# Patient Record
Sex: Male | Born: 1945 | Race: White | Hispanic: No | Marital: Married | Smoking: Former smoker
Health system: Southern US, Community
[De-identification: ages and names within clinical notes are randomized; demographics above are authoritative.]

## PROBLEM LIST (undated history)

## (undated) DIAGNOSIS — J189 Pneumonia, unspecified organism: Secondary | ICD-10-CM

## (undated) DIAGNOSIS — E785 Hyperlipidemia, unspecified: Secondary | ICD-10-CM

## (undated) DIAGNOSIS — I6529 Occlusion and stenosis of unspecified carotid artery: Secondary | ICD-10-CM

## (undated) DIAGNOSIS — R001 Bradycardia, unspecified: Secondary | ICD-10-CM

## (undated) DIAGNOSIS — M1990 Unspecified osteoarthritis, unspecified site: Secondary | ICD-10-CM

## (undated) DIAGNOSIS — I2510 Atherosclerotic heart disease of native coronary artery without angina pectoris: Secondary | ICD-10-CM

## (undated) DIAGNOSIS — F418 Other specified anxiety disorders: Secondary | ICD-10-CM

## (undated) DIAGNOSIS — Z0389 Encounter for observation for other suspected diseases and conditions ruled out: Secondary | ICD-10-CM

## (undated) DIAGNOSIS — C801 Malignant (primary) neoplasm, unspecified: Secondary | ICD-10-CM

## (undated) HISTORY — DX: Occlusion and stenosis of unspecified carotid artery: I65.29

## (undated) HISTORY — PX: SKIN CANCER EXCISION: SHX779

## (undated) HISTORY — DX: Malignant (primary) neoplasm, unspecified: C80.1

## (undated) HISTORY — DX: Hyperlipidemia, unspecified: E78.5

## (undated) HISTORY — PX: CORONARY ANGIOPLASTY WITH STENT PLACEMENT: SHX49

## (undated) HISTORY — PX: UMBILICAL HERNIA REPAIR: SHX196

## (undated) HISTORY — DX: Pneumonia, unspecified organism: J18.9

## (undated) HISTORY — PX: ORTHOPEDIC SURGERY: SHX850

## (undated) HISTORY — DX: Unspecified osteoarthritis, unspecified site: M19.90

## (undated) HISTORY — DX: Bradycardia, unspecified: R00.1

## (undated) HISTORY — DX: Atherosclerotic heart disease of native coronary artery without angina pectoris: I25.10

## (undated) HISTORY — PX: CATARACT EXTRACTION, BILATERAL: SHX1313

## (undated) HISTORY — PX: HERNIA REPAIR: SHX51

## (undated) HISTORY — DX: Encounter for observation for other suspected diseases and conditions ruled out: Z03.89

## (undated) HISTORY — PX: COLONOSCOPY: SHX174

## (undated) HISTORY — DX: Other specified anxiety disorders: F41.8

## (undated) HISTORY — PX: HIP ARTHROSCOPY: SUR88

---

## 2001-11-18 ENCOUNTER — Encounter: Payer: Self-pay | Admitting: Orthopedic Surgery

## 2001-11-22 ENCOUNTER — Encounter: Payer: Self-pay | Admitting: Orthopedic Surgery

## 2001-11-22 ENCOUNTER — Inpatient Hospital Stay (HOSPITAL_COMMUNITY): Admission: RE | Admit: 2001-11-22 | Discharge: 2001-11-26 | Payer: Self-pay | Admitting: Orthopedic Surgery

## 2003-12-28 ENCOUNTER — Inpatient Hospital Stay (HOSPITAL_COMMUNITY): Admission: EM | Admit: 2003-12-28 | Discharge: 2003-12-29 | Payer: Self-pay | Admitting: Emergency Medicine

## 2003-12-28 ENCOUNTER — Ambulatory Visit: Payer: Self-pay | Admitting: Cardiology

## 2004-01-11 ENCOUNTER — Ambulatory Visit: Payer: Self-pay | Admitting: *Deleted

## 2004-03-20 ENCOUNTER — Emergency Department (HOSPITAL_COMMUNITY): Admission: EM | Admit: 2004-03-20 | Discharge: 2004-03-21 | Payer: Self-pay | Admitting: Emergency Medicine

## 2004-03-22 ENCOUNTER — Ambulatory Visit: Payer: Self-pay

## 2004-04-22 ENCOUNTER — Ambulatory Visit: Payer: Self-pay

## 2004-05-19 ENCOUNTER — Ambulatory Visit: Payer: Self-pay | Admitting: Cardiology

## 2004-07-15 ENCOUNTER — Ambulatory Visit: Payer: Self-pay | Admitting: Cardiology

## 2004-12-30 ENCOUNTER — Ambulatory Visit: Payer: Self-pay | Admitting: Cardiology

## 2005-07-14 ENCOUNTER — Ambulatory Visit: Payer: Self-pay | Admitting: Cardiology

## 2005-07-21 ENCOUNTER — Inpatient Hospital Stay (HOSPITAL_BASED_OUTPATIENT_CLINIC_OR_DEPARTMENT_OTHER): Admission: RE | Admit: 2005-07-21 | Discharge: 2005-07-21 | Payer: Self-pay | Admitting: Cardiology

## 2005-07-21 ENCOUNTER — Ambulatory Visit: Payer: Self-pay | Admitting: Cardiology

## 2005-08-01 ENCOUNTER — Ambulatory Visit: Payer: Self-pay | Admitting: Cardiology

## 2005-11-03 ENCOUNTER — Ambulatory Visit: Payer: Self-pay | Admitting: Cardiology

## 2006-02-16 ENCOUNTER — Encounter: Admission: RE | Admit: 2006-02-16 | Discharge: 2006-02-16 | Payer: Self-pay | Admitting: Family Medicine

## 2006-06-01 ENCOUNTER — Ambulatory Visit: Payer: Self-pay | Admitting: Cardiology

## 2006-06-01 LAB — CONVERTED CEMR LAB
ALT: 26 units/L (ref 0–40)
AST: 32 units/L (ref 0–37)
Alkaline Phosphatase: 51 units/L (ref 39–117)
BUN: 17 mg/dL (ref 6–23)
CO2: 30 meq/L (ref 19–32)
Calcium: 9.3 mg/dL (ref 8.4–10.5)
Chloride: 107 meq/L (ref 96–112)
Cholesterol: 133 mg/dL (ref 0–200)
Creatinine, Ser: 1 mg/dL (ref 0.4–1.5)
Glucose, Bld: 97 mg/dL (ref 70–99)
Total Bilirubin: 1.1 mg/dL (ref 0.3–1.2)
Total Protein: 6.6 g/dL (ref 6.0–8.3)

## 2006-10-15 ENCOUNTER — Ambulatory Visit: Payer: Self-pay | Admitting: Cardiology

## 2006-10-15 ENCOUNTER — Inpatient Hospital Stay (HOSPITAL_COMMUNITY): Admission: EM | Admit: 2006-10-15 | Discharge: 2006-10-17 | Payer: Self-pay | Admitting: Emergency Medicine

## 2006-11-12 ENCOUNTER — Ambulatory Visit: Payer: Self-pay | Admitting: Cardiology

## 2006-11-16 ENCOUNTER — Ambulatory Visit: Payer: Self-pay

## 2007-04-19 ENCOUNTER — Ambulatory Visit: Payer: Self-pay | Admitting: Cardiology

## 2007-12-13 ENCOUNTER — Ambulatory Visit: Payer: Self-pay

## 2008-04-17 ENCOUNTER — Ambulatory Visit: Payer: Self-pay | Admitting: Cardiology

## 2008-10-15 ENCOUNTER — Telehealth: Payer: Self-pay | Admitting: Cardiology

## 2009-04-21 DIAGNOSIS — F329 Major depressive disorder, single episode, unspecified: Secondary | ICD-10-CM

## 2009-04-21 DIAGNOSIS — M1990 Unspecified osteoarthritis, unspecified site: Secondary | ICD-10-CM

## 2009-04-21 DIAGNOSIS — I6529 Occlusion and stenosis of unspecified carotid artery: Secondary | ICD-10-CM

## 2009-04-21 DIAGNOSIS — E785 Hyperlipidemia, unspecified: Secondary | ICD-10-CM | POA: Insufficient documentation

## 2009-04-21 DIAGNOSIS — I2510 Atherosclerotic heart disease of native coronary artery without angina pectoris: Secondary | ICD-10-CM

## 2009-04-23 ENCOUNTER — Ambulatory Visit: Payer: Self-pay | Admitting: Cardiology

## 2009-04-23 DIAGNOSIS — Z6831 Body mass index (BMI) 31.0-31.9, adult: Secondary | ICD-10-CM | POA: Insufficient documentation

## 2010-03-15 NOTE — Assessment & Plan Note (Signed)
Summary: 1 yr rov 414.01  pfh  Medications Added VITAMIN D3 1000 UNIT CAPS (CHOLECALCIFEROL) 2 podaily LIPITOR 20 MG TABS (ATORVASTATIN CALCIUM) 1 by mouth daiy ASPIRIN 81 MG  TABS (ASPIRIN) 1 by mouth daily MULTIVITAMINS   TABS (MULTIPLE VITAMIN) 1 by mouth daily LEXAPRO 10 MG TABS (ESCITALOPRAM OXALATE) 1 by mouth dialy LORAZEPAM 0.5 MG TABS (LORAZEPAM) as needed NITROSTAT 0.4 MG SUBL (NITROGLYCERIN) 1 by mouth daily AMOXICILLIN 500 MG CAPS (AMOXICILLIN) 1 by mouth two times a day      Allergies Added: NKDA  Visit Type:  Follow-up Primary Provider:  Paulene Floor, NP  CC:  CAD.  History of Present Illness: The patient presents for yearly followup. Since I last saw him he has had no cardiovascular complaints. He remains active at work and actually walks routinely for exercise. With this he denies any chest discomfort, neck or arm discomfort. He has had no palpitations, presyncope or syncope. He has had no PND or orthopnea.  Current Medications (verified): 1)  Plavix 75 Mg Tabs (Clopidogrel Bisulfate) .Marland Kitchen.. 1 By Mouth Daily 2)  Vitamin D3 1000 Unit Caps (Cholecalciferol) .... 2 Podaily 3)  Lipitor 20 Mg Tabs (Atorvastatin Calcium) .Marland Kitchen.. 1 By Mouth Daiy 4)  Aspirin 81 Mg  Tabs (Aspirin) .Marland Kitchen.. 1 By Mouth Daily 5)  Multivitamins   Tabs (Multiple Vitamin) .Marland Kitchen.. 1 By Mouth Daily 6)  Lexapro 10 Mg Tabs (Escitalopram Oxalate) .Marland Kitchen.. 1 By Mouth Dialy 7)  Lorazepam 0.5 Mg Tabs (Lorazepam) .... As Needed 8)  Nitrostat 0.4 Mg Subl (Nitroglycerin) .Marland Kitchen.. 1 By Mouth Daily 9)  Amoxicillin 500 Mg Caps (Amoxicillin) .Marland Kitchen.. 1 By Mouth Two Times A Day  Allergies (verified): No Known Drug Allergies  Past History:  Past Medical History: Reviewed history from 04/21/2009 and no changes required.  1. Coronary artery disease (status post Taxus stenting to the LAD.       This was in 2005.  Catheterization in 2008, demonstrated a proximal       calcification with ostial 40-50% stenosis, it was a widely patent         stent, the circumflex had 40% proximal stenosis, the right coronary       artery is large and dominant with minor nonobstructive plaque, the       EF was 55%).   2. Degenerative joint disease.   3. Depression/anxiety.   4. Orthopedic procedures on the right foot and right hip.   5. Dyslipidemia.   6. History of sinus bradycardia.   7. Remote tobacco use.   8. Carotid stenosis, nonobstructive, bilateral.      Past Surgical History: Reviewed history from 04/21/2009 and no changes required.  Orthopedic surgeries to the right foot and right hip secondary to       degenerative joint disease.   Review of Systems       As stated in the HPI and negative for all other systems.   Vital Signs:  Patient profile:   65 year old male Height:      71 inches Weight:      213 pounds BMI:     29.81 Pulse rate:   51 / minute Resp:     16 per minute BP sitting:   118 / 78  (right arm)  Vitals Entered By: Marrion Coy, CNA (April 23, 2009 10:55 AM)  Physical Exam  General:  Well developed, well nourished, in no acute distress. Head:  normocephalic and atraumatic Mouth:  eventually. Oral mucosa normal. Neck:  Neck  supple, no JVD. No masses, thyromegaly or abnormal cervical nodes. Chest Wall:  no deformities or breast masses noted Lungs:  Clear bilaterally to auscultation and percussion. Heart:  Non-displaced PMI, chest non-tender; regular rate and rhythm, S1, S2 without murmurs, rubs or gallops. Carotid upstroke normal, no bruit. Normal abdominal aortic size, no bruits. Femorals normal pulses, no bruits. Pedals normal pulses. No edema, no varicosities. Abdomen:  Bowel sounds positive; abdomen soft and non-tender without masses, organomegaly, or hernias noted. No hepatosplenomegaly, obese Msk:  Back normal, normal gait. Muscle strength and tone normal. Extremities:  No clubbing or cyanosis. Neurologic:  Alert and oriented x 3. Psych:  Normal affect.   EKG  Procedure date:   04/23/2009  Findings:      sinus bradycardia, rate 51, axis within normal limits, intervals within normal limits, no acute ST-T wave changes.  Impression & Recommendations:  Problem # 1:  CAD (ICD-414.00) He has no new cardiovascular symptoms. At this point he will continue with risk reduction. No further cardiovascular testing is suggested. Orders: EKG w/ Interpretation (93000)  Problem # 2:  DYSLIPIDEMIA (ICD-272.4) I do not have any recent lipid profiles but will defer to his primary physician with a goal LDL less than 100 and HDL greater than 40. His updated medication list for this problem includes:    Lipitor 20 Mg Tabs (Atorvastatin calcium) .Marland Kitchen... 1 by mouth daiy  Problem # 3:  CAROTID ARTERY STENOSIS (ICD-433.10) This was mild in 2009. I'll have a carotid Doppler repeated in the fall of this year and then as indicated afterwards.  Problem # 4:  OVERWEIGHT (ICD-278.02) He and I discussed diet strategies and exercise to lose weight and I urged him not to gain it back during the winter which has been his usual.  Patient Instructions: 1)  Your physician recommends that you schedule a follow-up appointment in: 12 months with Dr Antoine Poche 2)  Your physician recommends that you continue on your current medications as directed. Please refer to the Current Medication list given to you today.

## 2010-06-11 ENCOUNTER — Encounter: Payer: Self-pay | Admitting: Cardiology

## 2010-06-15 ENCOUNTER — Encounter: Payer: Self-pay | Admitting: Cardiology

## 2010-06-15 ENCOUNTER — Ambulatory Visit (INDEPENDENT_AMBULATORY_CARE_PROVIDER_SITE_OTHER): Payer: Self-pay | Admitting: Cardiology

## 2010-06-15 VITALS — BP 122/77 | HR 56 | Ht 72.0 in | Wt 212.0 lb

## 2010-06-15 DIAGNOSIS — I2510 Atherosclerotic heart disease of native coronary artery without angina pectoris: Secondary | ICD-10-CM

## 2010-06-15 DIAGNOSIS — E785 Hyperlipidemia, unspecified: Secondary | ICD-10-CM

## 2010-06-15 DIAGNOSIS — I6529 Occlusion and stenosis of unspecified carotid artery: Secondary | ICD-10-CM

## 2010-06-15 DIAGNOSIS — E663 Overweight: Secondary | ICD-10-CM

## 2010-06-15 NOTE — Assessment & Plan Note (Signed)
It has been 4 years since any stress testing. I will bring him back for a screening exercise treadmill test. He will otherwise continue with risk reduction.

## 2010-06-15 NOTE — Assessment & Plan Note (Signed)
He had mild carotid stenosis and is overdue for Dopplers and I will arrange this.

## 2010-06-15 NOTE — Assessment & Plan Note (Signed)
I reviewed lipid from last year and LDL was 82 with an HDL of 61. This is an acceptable ratio.

## 2010-06-15 NOTE — Patient Instructions (Signed)
You are being scheduled for a carotid doppler study and a treadmill test Continue your current medications

## 2010-06-15 NOTE — Progress Notes (Signed)
HPI The patient presents with a one year followup. Since I last saw him he has had no new cardiovascular complaints. He remains somewhat active doing some walking. With this he denies any chest pressure, neck or arm discomfort. He has no palpitations, presyncope or syncope. He has no shortness of breath, weight gain or edema. He has had swelling cough or fever.  He continued risk reduction.   No Known Allergies  Current Outpatient Prescriptions  Medication Sig Dispense Refill  . aspirin 81 MG tablet Take 81 mg by mouth daily.        Marland Kitchen atorvastatin (LIPITOR) 20 MG tablet Take 20 mg by mouth daily.        . Cholecalciferol (VITAMIN D3) 1000 UNITS CAPS Take 2 capsules by mouth daily.        . clopidogrel (PLAVIX) 75 MG tablet Take 75 mg by mouth daily.        Marland Kitchen escitalopram (LEXAPRO) 10 MG tablet Take 10 mg by mouth daily.        Marland Kitchen LORazepam (ATIVAN) 0.5 MG tablet Take 0.5 mg by mouth every 8 (eight) hours.        . Multiple Vitamins-Minerals (MULTIVITAMIN WITH MINERALS) tablet Take 1 tablet by mouth daily.        . nitroGLYCERIN (NITROSTAT) 0.4 MG SL tablet Place 0.4 mg under the tongue every 5 (five) minutes as needed.          Past Medical History  Diagnosis Date  . Coronary artery disease     s/p Taxus stenting to the LAD.  This was 2005. Catheterization in 2008, demonstrated a proximal calcification with ostial 40-50% stenosis, it was a widely patent stent, the circumflex had 40%  proximal stenosis,  the right coronary artery is large and dominant with minor nonobstructive plaque, the EF was 55%.  . Degenerative joint disease   . Depression with anxiety   . Dyslipidemia   . Sinus bradycardia   . Carotid stenosis     Past Surgical History  Procedure Date  . Orthopedic surgery     right foot and right hip secondary to DJD    ROS: As stated in the HPI and negative for all other systems.  PHYSICAL EXAM BP 122/77  Pulse 56  Ht 6' (1.829 m)  Wt 212 lb (96.163 kg)  BMI 28.75  kg/m2 GENERAL:  Well appearing HEENT:  Pupils equal round and reactive, fundi not visualized, oral mucosa unremarkable, edentulous NECK:  No jugular venous distention, waveform within normal limits, carotid upstroke brisk and symmetric, no bruits, no thyromegaly LYMPHATICS:  No cervical, inguinal adenopathy LUNGS:  Clear to auscultation bilaterally BACK:  No CVA tenderness CHEST:  Unremarkable HEART:  PMI not displaced or sustained,S1 and S2 within normal limits, no S3, no S4, no clicks, no rubs, no murmurs ABD:  Flat, positive bowel sounds normal in frequency in pitch, no bruits, no rebound, no guarding, no midline pulsatile mass, no hepatomegaly, no splenomegaly EXT:  2 plus pulses throughout, no edema, no cyanosis no clubbing SKIN:  No rashes no nodules NEURO:  Cranial nerves II through XII grossly intact, motor grossly intact throughout PSYCH:  Cognitively intact, oriented to person place and time   EKG:  Sinus rhythm, rate 56, axis within normal limits, intervals within normal limits, no acute ST-T wave changes.   ASSESSMENT AND PLAN

## 2010-06-15 NOTE — Assessment & Plan Note (Signed)
We discussed goals of weight loss.

## 2010-06-28 NOTE — Discharge Summary (Signed)
NAME:  [name], [name]                ACCOUNT NO.:  [id number]   MEDICAL RECORD NO.:  [id number]          PATIENT TYPE:  INP   LOCATION:  2003                         FACILITY:  [facility]   PHYSICIAN:  Noralyn Pick. Eden Emms, MD, FACCDATE OF BIRTH:  Jun 29, 1945   DATE OF ADMISSION:  10/15/2006  DATE OF DISCHARGE:  10/17/2006                               DISCHARGE SUMMARY   DISCHARGE DIAGNOSIS:  Chest pain, cardiac enzymes negative for  myocardial infarction and cardiac catheterization showing nonobstructive  disease.   SECONDARY DIAGNOSES:  1. Status post Taxus stent in 2005 to the left anterior descending.  2. Status post catheterization in 2007 with the STRADIVARIUS  trial      and intravascular ultrasound.  3. Degenerative joint disease.  4. Depression/anxiety.  5. History of orthopedic procedures to the right foot and right hip.  6. Dyslipidemia with a total cholesterol of 168, triglycerides 117,      HDL 50, LDL 95.  7. History of sinus bradycardia.  8. Family history of coronary artery disease.  9. Remote history of tobacco use.   TIME OF DISCHARGE:  33 minutes.   HOSPITAL COURSE:  [name] is a 65 year old male with known coronary  artery disease.  He had chest pain intermittently over the 4-5 days  prior to admission and it became worse on the night before admission.  When he came to the hospital, he was admitted for further evaluation.   His cardiac enzymes were negative for MI.  The cardiac catheterization  showed a patent LAD stent with a 30% mid LAD stenosis and a 50% proximal  LAD stenosis.  There was no significant disease in the RCA and the  circumflex had a 40% stenosis.  His EF was 55%.   His chest x-ray showed increased lower lobe opacity that is either  atelectasis or infection, so a repeat chest x-ray is pending.  A lipid  profile was checked and described above, so his Lipitor will be  increased from 10-20 mg a day to get his LDL at 70.  His cath site was  without  ecchymosis or hematoma and his chest pain had resolved so he is  considered stable for discharge.  His chest x-ray which is without acute  abnormality with outpatient follow-up arranged.   DISCHARGE INSTRUCTIONS:  1. His activity level is to include no driving for two days and no      lifting of more than 10 pounds for one week.  2. He is to call our office for any problems with the cath site.  3. He is to follow up with Dr. Antoine Poche on November 12, 2006, at      12:15.  4. He is to follow up with Dr. Christell Constant as needed.   DISCHARGE MEDICATIONS:  1. Aspirin 81 mg daily.  2. Plavix 75 mg daily.  3. Lipitor 20 mg daily.  4. Lexapro 10 mg daily.  5. Lorazepam 0.5 mg as at home.  6. Nitroglycerin sublingual p.r.n.      Theodore Demark, PA-C      Noralyn Pick. Eden Emms, MD, [facility]  Electronically Signed    RB/MEDQ  D:  10/17/2006  T:  10/17/2006  Job:  [id number]   cc:   Ernestina Penna, M.D.

## 2010-06-28 NOTE — Assessment & Plan Note (Signed)
[facility]                            CARDIOLOGY OFFICE NOTE   [name], [name]                       MRN:          [id number]  DATE:04/19/2007                            DOB:          30-Dec-1945    PRIMARY:  Dr. Vernon Prey.   REASON FOR PRESENTATION:  Evaluate patient with coronary disease.   HISTORY OF PRESENT ILLNESS:  The patient presents for followup of the  above.  He is 65 years old.  He has been doing well since I last saw  him.  He has been getting none of the chest discomfort that prompted his  hospitalization last year.  He does get some chest soreness if he has  been working hard all day.  However, he does not have the discomfort  while he is working.  He has no jaw or neck discomfort.  He has no  shortness of breath, PND or orthopnea.   PAST MEDICAL HISTORY:  1. Coronary artery disease.  (Status post Taxus stenting in the LAD.      This was in 2005.  Catheterization in 2008 demonstrated a proximal      calcification, ostial 40-50% stenosis, widely patent stent,      circumflex 40% proximal stenosis, right coronary artery large and      dominant with minor nonobstructive plaque, the EF was 55%.)  2. Degenerative joint disease.  3. Depression/anxiety.  4. Orthopedic procedures to the right foot and right hip.  5. Dyslipidemia.  6. History of sinus bradycardia.  7. Remote tobacco use.   ALLERGIES:  None.   MEDICATIONS:  1. Aspirin 81 mg daily.  2. Plavix 75 mg daily.  3. Lipitor 20 mg daily.  4. Lexapro 10 mg daily.  5. Multivitamin.   REVIEW OF SYSTEMS:  As stated in HPI and otherwise negative for other  systems.   PHYSICAL EXAMINATION:  The patient is in no distress.  Blood pressure  127/75, heart rate 71 and regular, weight 205 pounds, body mass index  28.  NECK:  No jugular distention at 45 degrees.  Carotid upstroke brisk and  symmetric.  No bruits, no thyromegaly.  LYMPHATICS:  No lymphadenopathy.  LUNGS:  Clear  to auscultation bilaterally.  BACK:  No costovertebral angle tenderness.  CHEST:  Unremarkable.  HEART:  PMI not displaced or sustained.  S1 and S2 within normal.  No  S3, no S4.  No clicks, no rubs, no murmurs.  ABDOMEN:  Obese, positive bowel sounds normal in frequency and pitch.  No bruits, no rebound, no guarding, no midline pulsatile mass, no  organomegaly.  SKIN:  No rashes, no nodules.  EXTREMITIES:  Show 2+ pulses, no edema.   EKG:  Sinus rhythm, rate 66, axis within normal limits, intervals within  normal limits, early transition lead V1 and V2, no acute ST-T wave  changes.   ASSESSMENT AND PLAN:  1. Coronary disease.  The patient is having no symptoms related to      this.  No further cardiovascular testing is suggested.  He will      continue with risk  reduction.  2. Dyslipidemia, per Dr. Christell Constant.  Goal is an LDL less than 100 and HDL      greater than 40.  3. Obesity.  He understands the need to continue to lose weight.  He      has been very successful at this in the past as he used to weight      235 at his peak.  4. Hypertension.  Blood pressure is well controlled.  He will continue      the medications as listed.  5. Follow-up.  I will see him back in 1 year or sooner if needed.     Rollene Rotunda, MD, [facility]  Electronically Signed    JH/MedQ  DD: 04/19/2007  DT: 04/21/2007  Job #: [id number]   cc:   Ernestina Penna, M.D.

## 2010-06-28 NOTE — Assessment & Plan Note (Signed)
[facility]                            CARDIOLOGY OFFICE NOTE   [name], [name]                       MRN:          [id number]  DATE:11/12/2006                            DOB:          Nov 14, 1945    PRIMARY CARE PHYSICIAN:  Ernestina Penna, M.D.   REASON FOR PRESENTATION:  Evaluate patient with coronary disease and  recent hospitalization for catheterization.   HISTORY OF PRESENT ILLNESS:  The patient presents for followup of the  above.  He was hospitalized on October 15, 2006, with chest discomfort.  He had negative cardiac enzymes and no acute T wave changes.  However,  his pain was suggestive of unstable angina.  He had previous stenting to  his LAD in 2005.  He underwent cardiac catheterization by Dr. Excell Seltzer.  The left main was normal with mild calcification.  The LAD wrapped the  apex.  There was proximal calcification.  There was ostial 40-50%  stenosis.  There was a widely patent stent.  The circumflex had 40%  proximal stenosis.  Right coronary artery was a large dominant vessel  with minor nonobstructive plaque.  The EF was 55%.   The patient says that since then, he has had none of the chest  discomfort that prompted that hospitalization.  He is trying to walk 4  times per week.  With this he is not bringing on any chest discomfort,  neck or arm discomfort.  He is not having any palpitations, presyncope,  or syncope.  He does have some generalized weakness which he attributes  to his age.  He had no problems with his cath aside from some mild  bruising.   PAST MEDICAL HISTORY:  1. Coronary artery disease as described.      a.     Status post TAXUS stenting in the past.  2. Degenerative joint disease.  3. Depression/anxiety.  4. Orthopedic procedures to the right foot and right hip.  5. Dyslipidemia.  6. History of sinus bradycardia.  7. Remote tobacco use.   ALLERGIES:  None.   MEDICATIONS:  1. Aspirin 81 mg daily.  2.  Plavix 75 mg daily.  3. Lipitor 20 mg daily.  4. Lexapro 10 mg daily.   REVIEW OF SYSTEMS:  As stated in the HPI and otherwise negative for  other systems.   PHYSICAL EXAMINATION:  GENERAL:  The patient is in no distress.  VITAL SIGNS:  Blood pressure 129/68, heart rate 61 and regular, weight  209 pounds, body mass index 28.  HEENT:  Eyelids unremarkable.  Pupils are equal, round, and reactive to  light .  Fundi not visualized.  Oral mucosa unremarkable.  NECK:  No jugular venous distention at 45 degrees.  Carotid upstroke  brisk and symmetric.  No bruits.  No thyromegaly.  LYMPHATICS:  No cervical, axillary, or inguinal adenopathy.  LUNGS:  Clear to auscultation bilaterally.  BACK:  No costovertebral angle tenderness.  CHEST:  Unremarkable.  HEART:  PMI not displaced or sustained.  S1 and S2 within normal limits.  No S3, no S4, no clicks, no rubs, no  murmurs.  ABDOMEN:  Flat.  Positive bowel sounds, normal in frequency and pitch.  No bruits.  No rebound.  No guarding.  No midline pulsatile mass.  No  hepatomegaly, no splenomegaly.  SKIN:  No rashes.  No nodules.  EXTREMITIES:  Two plus pulses throughout.  No edema, no cyanosis, no  clubbing.  NEUROLOGIC:  Oriented to person, place and time.  Cranial nerves II-XII  grossly intact.  Motor grossly intact.   EKG:  Sinus rhythm, rate 62, axis within normal limits, intervals within  normal limits, early transition lead V1 and V2, no acute ST-T wave  changes.   ASSESSMENT/PLAN:  1. Coronary disease as described.  He will continue with aggressive      secondary risk reduction.  No further cardiovascular testing is      suggested.  2. Dyslipidemia per Dr. Christell Constant who is watching this carefully.  3. Hypertension. The patient's blood pressure is controlled.  He will      continue the medications as listed.   FOLLOWUP:  I will see the patient back in 6 months and probably yearly  thereafter.     Rollene Rotunda, MD, [facility]   Electronically Signed    JH/MedQ  DD: 11/12/2006  DT: 11/12/2006  Job #: [id number]   cc:   Ernestina Penna, M.D.

## 2010-06-28 NOTE — H&P (Signed)
NAME:  [name], [name]                ACCOUNT NO.:  [id number]   MEDICAL RECORD NO.:  [id number]          PATIENT TYPE:  INP   LOCATION:  1823                         FACILITY:  [facility]   PHYSICIAN:  Gerrit Friends. Dietrich Pates, MD, FACCDATE OF BIRTH:  1945-09-03   DATE OF ADMISSION:  10/15/2006  DATE OF DISCHARGE:                              HISTORY & PHYSICAL   PRIMARY CARDIOLOGIST:  Rollene Rotunda, MD, [facility], previously Salvadore Farber, MD.   PRIMARY CARE PHYSICIAN:  Ernestina Penna, M.D. at [facility]   [name] is a very pleasant 65 year old Caucasian gentleman with known  history of coronary artery disease status post Taxus drug-eluting stent  to the LAD in 2005.  At that time, the patient was found to have  nonobstructive CAD in the right coronary and left circumflex. He was a  participant in the Stradivarius trial.  His last cath was in 2007.  Apparently the patient had to stop the medication for the Stradivarius  trial secondary to increased anxiety.  Cardiac catheterization was done  in 2007 to re-evaluate stent which was found to be patent.  [name]  presents to [facility] emergency room today secondary to chest  discomfort.  He states for the last 4-5 days he has had some tightness  under his left breast with bilateral arm weakness associated with a  headache and increased dyspnea on exertion.  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  He mows, uses a chainsaw, pulls stumps up, and  weeding without usually having any problems.  However, over the last  week, he has complained of increased fatigue and weakness in the setting  of feeling just washed out and then the episode of chest tightness.  Last night the chest pain came on and it was worse than it had been.  He  described it as an 8 on a scale of 1-10.  He became diaphoretic, clammy  and nauseated with this episode.  He states he just felt  really washed  out all over.  He did not take any nitroglycerin though.  The chest  discomfort waxed and waned for about 4-5 hours.  He finally went to bed  but did not sleep well.  This morning he got up and he still felt really  bad.  Apparently he lives with his mother, and his mother called his  daughter who insisted he come to the emergency room to get evaluated.  On arrival here, his discomfort was around a 5.  He was start on  nitroglycerin IV.  This decreased his discomfort to a 3.  He is  currently rating his chest pressure around 3-4 on five mg of nitro.   ALLERGIES:  No known drug allergies.   MEDICATIONS:  1. Multivitamin daily.  2. Aspirin 81.  3. Plavix 75.  4. Lipitor 10.  5. Lexapro 10.  6. Ativan 0.5 p.r.n.   PAST MEDICAL HISTORY:  1. Coronary artery disease status post cath with a placement of a drug-  eluting stent to the LAD in 2005.  EF was 60% at that time.  Most      recent cath in 2007 - cath with IVUS study.  The patient was found      to have 3% narrowing in the proximal LAD, 40% narrowing in the mid      LAD, 0% stenosis of the Taxus stent site in mid-LAD, 50% narrowing      in the proximal circumflex artery which was slightly worse, 20%      narrowing in the mid to distal RCA which was better, and normal LV      function.  The IVUS (as part of the Stradivarius trial) showed      plaque in the proximal portion of the circumflex and narrowing of      the channel approximately 50%.  2. Depression/anxiety.  3. Orthopedic surgeries to the right foot and right hip secondary to      degenerative joint disease.  4. Dyslipidemia.  5. Sinus brady.   SOCIAL HISTORY:  The patient lives with his mother.  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  He is divorced.  He has adult children.  Denies  any tobacco use.  He quit in 2005.  He tries to walk a mile or two every  day.  EtOH use - He has a remote history of daily beer consumption;  however, he quit completely in  2005.  He tries to follow a heart-healthy  diet.  He denies any drug or medication use.   FAMILY HISTORY:  Mother alive and well at 18 with some COPD.  Father  alive with known coronary artery disease.  He has a stent and known  diabetes.  Siblings with known coronary artery disease status post MI.   REVIEW OF SYSTEMS:  Positive for fevers, sweats, headache, chest pain,  dyspnea on exertion, occasional edema in right which is a chronic  finding, occasional palpitations at night, cough and wheezing,  occasional generalized weakness.   PHYSICAL EXAM:  VITAL SIGNS:  The patient is afebrile, heart rate 46,  respirations 16, blood pressure 119/70.  He is satting 100% on room air.  Currently rating his chest pain as 3 on a scale of 1-10; otherwise in no  acute distress.  HEENT:  Normocephalic, atraumatic.  Pupils equal, round and reactive to  light.  Sclerae is clear.  Mucous membranes moist.  He has dentures  intact and glasses on.  NECK:  Supple without lymphadenopathy, no bruits.  No JVD.  CARDIOVASCULAR:  Exam reveals S1 and S2.  Brady rhythm.  LUNGS:  Clear to auscultation bilaterally.  SKIN:  Warm and dry.  ABDOMEN:  Soft, nontender, positive bowel sounds.  LOWER EXTREMITIES:  Without clubbing, cyanosis or edema.  He has 2+ DP  bilaterally.  NEUROLOGICALLY:  Alert and oriented x3.  Cranial Nerves II-XII grossly  intact.   CHEST X-RAY:  Showing no edema, no effusion.  He has left lower lobe  opacity not well correlated on PA films, questionable atelectasis versus  developing infection with recommendations to repeat study was better  inspiration to confirm.   EKG:  Sinus brady at a rate of 47.   LAB WORK:  H&H 13.2 and 37.9, WBC 6.4 and platelets 222,000.  Sodium  137, potassium 4.3, chloride 107, BUN 10, creatinine 0.6 with a glucose  of 104.  BNP 96, troponin less than 0.05.   PLAN OF CARE:  Dr. Dietrich Pates to examine and assess the  patient who  complains with recurrent symptoms  of angina 3 years after  catheterization intervention.  Disease was stable at coronary angiogram  done for research in  2007.  The patient needs repeat study.  We will treat with low molecular  weight heparin, IV nitroglycerin, continue aspirin and clopidogrel, a  beta-blocker secondary to sinus brady, oxygen and morphine p.r.n.  Continue statin and aspirin.      Dorian Pod, ACNP      Gerrit Friends. Dietrich Pates, MD, [facility]  Electronically Signed    MB/MEDQ  D:  10/15/2006  T:  10/15/2006  Job:  [id number]   cc:   Ernestina Penna, M.D.

## 2010-06-28 NOTE — Assessment & Plan Note (Signed)
[facility]                            CARDIOLOGY OFFICE NOTE   [name], [name]                       MRN:          [id number]  DATE:04/17/2008                            DOB:          1945-12-13    PRIMARY CARE PHYSICIAN:  Mary-Margaret Daphine Deutscher, nurse practitioner,  [facility].   REASON FOR PRESENTATION:  Evaluate the patient with coronary artery  disease.   HISTORY OF PRESENT ILLNESS:  The patient is 65 years old.  He has done  well since I last saw him.  He remains active.  He is working daily.  He  does not have any chest discomfort, neck or arm discomfort.  He has had  no palpitations, presyncope, or syncope.  He denies any PND or  orthopnea.   PAST MEDICAL HISTORY:  1. Coronary artery disease (status post Taxus stenting to the LAD.      This was in 2005.  Catheterization in 2008, demonstrated a proximal      calcification with ostial 40-50% stenosis, it was a widely patent      stent, the circumflex had 40% proximal stenosis, the right coronary      artery is large and dominant with minor nonobstructive plaque, the      EF was 55%).  2. Degenerative joint disease.  3. Depression/anxiety.  4. Orthopedic procedures on the right foot and right hip.  5. Dyslipidemia.  6. History of sinus bradycardia.  7. Remote tobacco use.  8. Carotid stenosis, nonobstructive, bilateral.   ALLERGIES:  None.   MEDICATIONS:  1. Aspirin 81 mg daily.  2. Plavix 75 mg daily.  3. Lipitor 20 mg daily.  4. Lexapro 10 mg daily.  5. Multivitamin.   REVIEW OF SYSTEMS:  As stated in the HPI and otherwise negative for all  other systems.   PHYSICAL EXAMINATION:  GENERAL:  The patient is pleasant and in no  distress.  VITAL SIGNS:  Blood pressure 108/73, heart rate 50 and regular, and  weight 207 pounds.  HEENT:  Eyelids unremarkable; pupils are equal, round, and reactive to  light; fundi not visualized; oral mucosa unremarkable.  NECK:  No jugular venous distention at 45 degrees, carotid upstroke  brisk and symmetric, no bruits, no thyromegaly.  LYMPHATICS:  No adenopathy.  LUNGS:  Clear to auscultation bilaterally.  BACK:  No costovertebral angle tenderness.  CHEST:  Unremarkable.  HEART:  PMI not displaced or sustained; S1 and S2 within normal limits,  no S3, no S4; no clicks, no rubs, no murmurs.  ABDOMEN:  Mildly obese; positive bowel sounds, normal in frequency and  pitch; no bruits, no rebound, no guarding, no midline pulsatile mass; no  hepatomegaly, no splenomegaly.  SKIN:  No rashes, no nodules.  NEURO:  Oriented to person, place, and time; cranial nerves II through  XII grossly intact; motor grossly intact.   EKG, sinus bradycardia, rate 50, axis within normal limits, intervals  within normal limits, early transition lead V2, no acute ST-T wave  changes.   ASSESSMENT AND PLAN:  1. Coronary artery disease.  The patient is  having no symptoms related      to this.  He will continue with aggressive risk reduction.  He      exercises routinely.  No further cardiovascular testing is      suggested.  2. Dyslipidemia.  Per [employer], the goal LDL less than 100      and HDL greater than 40.  I would be happy to review these labs.  3. Obesity.  We discussed losing weight as he has not done this since      last visit.  Hopefully, he will concentrate on this per my      recommendations.  4. Bradycardia.  He is not symptomatic with this.  He will remain off      beta-blockers.  5. Carotid stenosis.  He is due to have Dopplers this year, but I do      not think he needs it.  This could wait till 2011.  He will      continue with risk reduction.  6. Followup.  I will see him back in 1 year.     Rollene Rotunda, MD, [facility]  Electronically Signed    JH/MedQ  DD: 04/17/2008  DT: 04/18/2008  Job #: [id number]   cc:   Bennie Pierini, [place].P.

## 2010-06-28 NOTE — Cardiovascular Report (Signed)
NAME:  [name], [name]                ACCOUNT NO.:  [id number]   MEDICAL RECORD NO.:  [id number]          PATIENT TYPE:  INP   LOCATION:  2003                         FACILITY:  [facility]   PHYSICIAN:  Veverly Fells. Excell Seltzer, MD  DATE OF BIRTH:  [birth year]   DATE OF PROCEDURE:  10/16/2006  DATE OF DISCHARGE:                            CARDIAC CATHETERIZATION   PROCEDURE:  Left heart catheterization, selective coronary angiography,  left ventricular angiography.   INDICATIONS:  [name] is a 65 year old gentleman with coronary artery  disease.  He has had prior stenting of the LAD.  He presented with chest  pain.  In the setting of his known CAD and recent onset recurrent chest  pain, he was referred for cardiac catheterization.   The risks and indications of the procedure were reviewed with the  patient, informed consent was obtained.  The right groin was prepped,  draped, anesthetized with 1% lidocaine. Using the modified Seldinger  technique, a 6-French sheath was placed in the right femoral artery.  Multiple views of the right and left coronary arteries were taken with  standard Judkins catheters. Following selective coronary angiography,  an angled pigtail catheter was inserted in the left ventricle where  pressures were recorded. A left ventriculogram was performed and  pullback across the aortic valve was done.   FINDINGS:  Aortic pressure 94/54 with a mean of 71, left ventricular  pressure is 101/16.   The left main coronary artery was angiographically normal.  There was  mild calcification.  It bifurcates into the LAD and left circumflex.   The LAD is a large-caliber vessel that courses down and wraps around the  LV apex.  The proximal portion of the LAD is calcified.  The ostium of  the LAD has a 40-50% stenosis.  The mid portion of the LAD has a widely  patent stent.  There is a very small first diagonal branch followed by a  large second diagonal branch.  The diagonals have no  significant  angiographic stenosis.   The left circumflex is a large-caliber vessel that courses down and has  a 40% proximal stenosis.  It then supplies a small OM branch followed by  a large left posterolateral branch. There is no other significant  angiographic stenosis in the left circumflex.   The right coronary artery is a very large caliber vessel.  There is  minor diffuse nonobstructive plaque.  The right coronary artery is  dominant. There is a PDA and a right posterolateral branch that have no  significant angiographic stenosis.   Left ventricular function assessed by ventriculography is normal.  The  LVEF is 55%.  There is no mitral regurgitation.   ASSESSMENT:  1. Mild to moderate ostial LAD stenosis with a widely patent stent in      the mid-LAD.  2. Nonobstructive left circumflex disease.  3. Nonobstructive right coronary artery disease.  4. Normal left ventricular function.   I recommend medical therapy.  [name] does have some stenosis of the  ostial LAD and mid circumflex, although neither appear flow limiting.  I  think  he will respond well to ongoing medical therapy.  He was cathed in  early 2007 and I am going to compare his films to his previous to make  sure there has not been significant progression.      Veverly Fells. Excell Seltzer, MD  Electronically Signed     MDC/MEDQ  D:  10/16/2006  T:  10/17/2006  Job:  [id number]   cc:   Rollene Rotunda, MD, [facility]  Ernestina Penna, M.D.

## 2010-07-01 NOTE — Consult Note (Signed)
NAME:  FLYNN, GWYN NO.:  [id number]   MEDICAL RECORD NO.:  [id number]          PATIENT TYPE:  INP   LOCATION:  1827                         FACILITY:  [facility]   PHYSICIAN:  Salvadore Farber, M.D. LHCDATE OF BIRTH:  Oct 26, 1945   DATE OF CONSULTATION:  12/28/2003  DATE OF DISCHARGE:                                   CONSULTATION   REFERRING PHYSICIAN:  Dr. Michaelyn Barter.   PRIMARY CARE PHYSICIAN:  Dr. Ernestina Penna.   REASON FOR CONSULTATION:  Chest pain.   HISTORY OF PRESENT ILLNESS:  Mr. Tomasik is a 65 year old gentleman without  prior history of cardiovascular disease.  Risk factors are age, sex,  positive family history, and ongoing tobacco abuse.   He presented approximately 6 months ago with an episode of chest discomfort.  He underwent an ETT- Cardiolite which reportedly demonstrated no evidence of  ischemia or infarction.  He did well subsequently.  However, for the past  week, he has had left lower chest discomfort coming on intermittently which  he has felt to be indigestion.  This morning, he awoke at 2 a.m. with left-  sided squeezing chest discomfort associated with diaphoresis and nausea.  The discomfort radiated to his left arm and subsequently to his back.  At  its most severe, it was 7/10.  It was relieved with the nitroglycerin in the  ER.  He is currently pain-free after experiencing approximately 90 minutes  of chest discomfort.  He had similar pain yesterday with raking leaves.   PAST MEDICAL HISTORY:  1.  Status post right total hip replacement in 2003.  2.  Inguinal hernia repair.   ALLERGIES:  No known drug allergies.   MEDICATIONS PRIOR TO ADMISSION:  Aspirin 81 mg per day.   SOCIAL HISTORY:  The patient lives in [place].  He is divorced with 3  children.  He is accompanied by his son and mother today.  He works in  Environmental [employer].  He smokes 1-1/2 packs per day, as he has for the  past 50 years, drinks  approximately 2 beers per day.   FAMILY HISTORY:  Father is alive at 56 with diabetes.  Mother is alive with  COPD at 53.  A sister had an MI in her 14s.   REVIEW OF SYSTEMS:  Occasional headaches, episode of presyncope last week  without palpitations, chronic cold intolerance.  Otherwise negative in  detail except as above.   PHYSICAL EXAMINATION:  GENERAL/VITAL SIGNS:  On physical examination, this  is a generally well-appearing man in no distress with heart rate 52, blood  pressure 145/81 and temperature of 98.6.  NECK:  He has no jugular venous distention and no thyromegaly.  LUNGS:  Lungs are clear to auscultation.  CARDIAC:  He has a nondisplaced point of maximal impulse.  There is a  regular rate and rhythm without murmur, rub or gallop.  Carotid pulses are  2+ bilaterally without bruit.  Femoral pulses are 2+ bilaterally without  bruit.  DP pulses 2+ bilaterally.  ABDOMEN:  The abdomen is soft, non-distended and nontender.  There is no  hepatosplenomegaly.  Bowel sounds are normal.  EXTREMITIES:  Extremities are warm without clubbing, cyanosis, edema, or  ulceration.   LABORATORY AND ACCESSORY CLINICAL DATA:  Chest x-ray demonstrates question  of mild vascular crowding.   Electrocardiogram demonstrates sinus bradycardia with a single PVC.  There  is early R wave progression, perhaps due to lead displacement.   Laboratory studies remarkable for creatinine 0.8, troponin I less than 0.05  x2, hematocrit 42, creatinine 0.8, glucose 121, platelets 228,000.   IMPRESSION/RECOMMENDATION:  1.  Chest discomfort:  Pain is reasonably typical for myocardial ischemia      suggestive of unstable angina.  I discussed options for further      evaluation including repeat stress test versus cardiac catheterization.      Given his negative stress test 6 months ago, he prefers to proceed with      cardiac catheterization for definitive diagnosis.  This will also afford      the opportunity  for percutaneous intervention at that time, should it be      indicated.  In the interim, we will continue aspirin and heparin.  2.  Elevated glucose:  Check hemoglobin A1c.  3.  Tobacco abuse:  Cessation advised.      Will   WED/MEDQ  D:  12/28/2003  T:  12/28/2003  Job:  [id number]   cc:   Ernestina Penna, M.D.  [address] [place]  [place] [address]  Fax: [phone]   Michaelyn Barter, M.D.

## 2010-07-01 NOTE — Discharge Summary (Signed)
NAME:  [name], [name]                ACCOUNT NO.:  [id number]   MEDICAL RECORD NO.:  [id number]          PATIENT TYPE:  INP   LOCATION:  6529                         FACILITY:  [facility]   PHYSICIAN:  Gertha Calkin, M.D.DATE OF BIRTH:  Jan 11, 1946   DATE OF ADMISSION:  12/28/2003  DATE OF DISCHARGE:  12/29/2003                                 DISCHARGE SUMMARY   PHYSICIANS:  1.  Gentry Fitz, primary care physician.  2.  [facility], cardiologist.   CHIEF COMPLAINT:  Chest pain.   DISCHARGE DIAGNOSES:  1.  Unstable angina.  2.  Status post catheterization and stent placement.  3.  Hypercholesterolemia.   DISCHARGE MEDICATIONS:  1.  Aspirin 325 mg p.o. q.d.  2.  Plavix 75 mg p.o. q.d.  3.  Lipitor 10 mg p.o. q.h.s.   DIET:  Low cholesterol.   FOLLOW UP:  Follow up at [facility] on January 12, 2004 at 11 a.m.  She will need CMET to follow up hepatic panel after initiating Lipitor.  The  labs to be drawn per cardiology.   HOSPITAL COURSE:  Problem 1:  CHEST PAIN:  The patient was admitted.  Enzymes were drawn as well as an EKG which did not show any acute ST  elevation.  However, history was very classic for unstable angina.  Cardiology was consulted and empiric anticoagulation was started with  heparin.  Oxygen and nitrates as well as aspirin given in the emergency  department.  Please see H&P for further details.   The patient was then taken to the catheter lab at which point a stent was  placed in the mid-LAD due to significant blockage.  The patient was then  transferred to the 6500 [address] floor, at which point there was no recurrent  chest pain.  The patient was afebrile and vital signs were stable at the  time of discharge.  The patient also was instructed on tobacco cessation.   Problem 2:  HYPERCHOLESTEROLEMIA:  Fasting lipid profile showed a LDL of 94,  HDL of 49, total cholesterol of 164.  Otherwise, other labs including a BMET  and CBC were within  normal range.  TSH was drawn and it was also normal.   DISPOSITION:  Stable on discharge. The patient is undecided regarding  primary care physician for routine medical health care.       JD/MEDQ  D:  12/29/2003  T:  12/29/2003  Job:  [id number]   cc:   [facility]

## 2010-07-01 NOTE — Assessment & Plan Note (Signed)
[facility]                            CARDIOLOGY OFFICE NOTE   Jason, Reid                       MRN:          [id number]  DATE:06/01/2006                            DOB:          Feb 29, 1945    HISTORY OF PRESENT ILLNESS:  [name] is a 65 year old gentleman with  coronary disease status post placement of a drug eluting stent in the  LAD in 2005. Ejection fraction is 60%. He has done very nicely since  that procedure. He remains active working six days a day. He walks his  dog greater than a mile several days a week. He has not had any  exertional dyspnea, PND, orthopnea, edema, chest discomfort, syncope, or  pre-syncope. He has no claudication, but does have some chronic  discomfort in his right hip which has been replaced.   CURRENT MEDICATIONS:  1. Aspirin 81 mg daily.  2. Plavix 75 mg daily.  3. Lipitor 10 mg nightly.  4. Lorazepam 0.5 mg 3 times daily.  5. Lexapro 10 mg daily.  6. Multivitamin.   PHYSICAL EXAMINATION:  GENERAL:  He is generally well appearing in no  distress.  VITAL SIGNS:  Heart rate 65, blood pressure 118/66, weight 197 pounds.  NECK:  He has no jugular venous distension, thyromegaly, or  lymphadenopathy. Carotid pulses are 2+ bilaterally without bruit.  LUNGS:  Clear to auscultation.  HEART:  He as non-displaced point of maximal cardiac impulse. There is a  regular rate and rhythm without murmur, rub, or gallop.  ABDOMEN:  Soft, nontender, nondistended. There is no hepatosplenomegaly.  Bowel sounds are normal.  EXTREMITIES:  Warm without cyanosis, clubbing, edema, or ulceration.  Femoral pulses 2+ bilaterally.   ELECTROCARDIOGRAM:  Demonstrates normal sinus rhythm. It is a normal  EKG.   IMPRESSION/RECOMMENDATIONS:  1. Coronary disease: Doing well after drug eluting stent placement in      the proximal LAD. Continue aspirin and Plavix indefinitely.  2. Hypercholesterolemia: Check lipids and LFTs  today.   I will ask him to follow up with Dr. Antoine Poche in our [place] office in  six months time.     Salvadore Farber, MD  Electronically Signed   WED/MedQ  DD: 06/01/2006  DT: 06/02/2006  Job #: [id number]

## 2010-07-01 NOTE — Cardiovascular Report (Signed)
NAME:  [name], [name] NO.:  [id number]   MEDICAL RECORD NO.:  [id number]          PATIENT TYPE:  INP   LOCATION:  1827                         FACILITY:  [facility]   PHYSICIAN:  Charlton Haws, M.D.     DATE OF BIRTH:  Oct 12, 1945   DATE OF PROCEDURE:  12/28/2003  DATE OF DISCHARGE:                              CARDIAC CATHETERIZATION   DEPARTMENT OF CARDIOLOGY.   INDICATIONS FOR PROCEDURE:  Chest pain, angina.   SITE OF CATHETERIZATION:  Right femoral artery.   FINDINGS:  1.  The left main coronary artery was normal.  2.  The left anterior descending artery had a 70% eccentric stenosis in the      mid vessel and it appeared somewhat hazy.  3.  First diagonal branch was normal.  4.  Circumflex coronary artery was nondominant and normal.  5.  Right coronary artery had a 50% lesion in the distal vessel which was      long and tubular.   VENTRICULOGRAPHY:  Ventriculography was normal.  Ejection fraction was 55%.  There was no gradient across the aortic valve and no MR.  Aortic pressure  was 132/75.  LV pressure was 134/11.   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       PN/MEDQ  D:  12/28/2003  T:  12/28/2003  Job:  [id number]

## 2010-07-01 NOTE — Cardiovascular Report (Signed)
NAME:  DELANEY, SCHNICK NO.:  [id number]   MEDICAL RECORD NO.:  [id number]          PATIENT TYPE:  OIB   LOCATION:  1961                         FACILITY:  [facility]   PHYSICIAN:  Charlies Constable, M.D. [facility] DATE OF BIRTH:  [birth year]   DATE OF PROCEDURE:  07/21/2005  DATE OF DISCHARGE:                              CARDIAC CATHETERIZATION   PROCEDURE:  Cardiac catheterization and intravascular ultrasound study   CLINICAL HISTORY:  Mr. Grobe is 65 years old and 2 years ago had a Taxus  stent placed in the LAD and had intravascular ultrasound of the circumflex  artery as part of the Stradivarius trial.  He has done quite well since that  time, and returns now for followup catheterization and Ivus as part of the  Stradivarius trial. Unfortunately he had to stop the Stradivarius study  medication, in the first 2-3 months due to problems with anxiety.   DESCRIPTION OF PROCEDURE:  The procedure was performed via the right femoral  artery utilizing an arterial sheath and 6-French and preformed coronary  catheters.  The patient given a weight-adjusted heparin __________ greater  than 200 seconds.  We used a JL-4 6-French guiding catheter and a loose  wire.  We passed the wire down the circumflex artery without difficulty.  We  passed the Ivus catheter down to the distal segment, but had difficulty with  the sled and the pullback.  We got a new sled and went back down with the  Ivus catheter and had difficulty with image of the Ivus catheter.  We had to  change out for a new Ivus catheter.  Before we were able to complete the  study, the patient developed chest pain and ST elevation in inferior leads.  It appeared that there was slow flow down the distal portion of the  circumflex artery and we thought there may have been some spasm induced by  the wire.  We gave nitroglycerin and this responded to that.  While we were  waiting for a response.  We took a took a picture of the right  coronary to  be sure that there was no problem; and this looked okay.  We then went back  in and put the Ivus catheter down the circumflex artery and did automatic  pullback.  The catheter was then removed.  The right femoral was closed with  AngioSeal at the end of the procedure.  Despite the technical difficulties,  in spite of the spasm and ST elevation, the patient tolerated the overall  procedure well and left the laboratory in condition.   RESULTS:  The aortic pressure was 102/60 with a mean of 79.  Left  ventricular pressure was 102/14.   LEFT MAIN CORONARY ARTERY:  The left main coronary artery was free of  significant disease.   LEFT ANTERIOR DESCENDING ARTERY:  The left anterior descending artery gave  rise to 3 diagonal branches and 4 septal perforators.  There was 30%  narrowing near the ostium of the LAD.  There was 40% narrowing of the  proximal LAD; and there was 30% narrowing  distally at the proximal edge of  the stent.  There is 0% stenosis at the stent which was located in the mid  vessel.  The rest of the vessel was free of significant obstruction.   CIRCUMFLEX ARTERY:  The circumflex artery gave rise to a ramus branch, an  atrial branch, a small marginal branch, and then terminated in a large  posterolateral branch.  There was 50% narrowing in the proximal portion of  the circumflex artery before the small marginal branch.   RIGHT CORONARY ARTERY:  The right coronary artery is a moderately large  vessel gave rise to a conus branch, a ventricular branch, a posterior  descending branch, and 3 posterolateral branches.  There was 20% stenosis in  the mid-to-distal vessel which appeared better than the 50% stenosis on the  prior study.  There was 30% narrowing just distal to the posterior branch.  The rest of the vessel was free of significant obstruction.   LEFT VENTRICULOGRAM:  The left ventriculogram performed in the RAO  projection showed good wall motion with no  areas of hypokinesis.  The  estimated fraction was 60%.   The Ivus of the circumflex artery showed minimal plaque throughout most of  vessel, but in the proximal vessel; there was a moderate amount of plaque  that narrowed the channel about 50% in area.   CONCLUSION:  1.  Coronary artery disease status post previous percutaneous cardiac      intervention, as described above with 30% narrowing in the proximal LAD,      40% narrowing in the mid LAD, 0% stenosis at the Taxus stent site in the      mid-LAD, 50% narrowing in the proximal circumflex artery (slightly      worse), 20% narrowing in the mid-to-distal right coronary artery      (better), and normal LV function.  2.  Intravascular ultrasound as part of the Stradivarius trial showing      plaque in the proximal portion of the circumflex artery and  narrowing      of the channel approximately 50% by area.   DISPOSITION:  The patient returned to the postop catheterization area for  further observation.           ______________________________  Charlies Constable, M.D. [facility]     BB/MEDQ  D:  07/21/2005  T:  07/21/2005  Job:  [id number]   cc:   Salvadore Farber, M.D. [facility]  [address]  [address]  [place]  [place] [address]   Caren Macadam, PA-C  [facility]   Cardiopulmonary Lab

## 2010-07-01 NOTE — Discharge Summary (Signed)
NAME:  [name], Bogus NO.:  [id number]   MEDICAL RECORD NO.:  [id number]                   PATIENT TYPE:   LOCATION:                                       FACILITY:   PHYSICIAN:  Harvie Junior, M.D.                DATE OF BIRTH:  [birth year]   DATE OF ADMISSION:  11/22/2001  DATE OF DISCHARGE:  11/26/2001                                 DISCHARGE SUMMARY   ADMISSION DIAGNOSES:  1. Degenerative joint disease right hip.  2. History of tobacco abuse.   DISCHARGE DIAGNOSES:  1. Degenerative joint disease right hip.  2. History of tobacco abuse.   PROCEDURE:  Right total hip arthroplasty by Harvie Junior, M.D. on November 22, 2001.   HISTORY OF PRESENT ILLNESS:  The patient is a pleasant 65 year old male who  has had ongoing right hip pain which has gotten worse in the past five years  or so.  In the last three years, the pain has worsened to the point where he  has pain with ambulation, night pain, and inability to ambulate without  significant problems due to his right hip pain.  X-rays of the right hip  showed severe degenerative joint disease of the right hip with loss of joint  space.  Based on his clinical and radiographic findings, he is felt to be a  candidate for right total hip arthroplasty and is admitted for this.   LABORATORY DATA:  EKG on admission showed sinus bradycardia, otherwise  normal EKG.  Postoperative x-rays of the right hip showed normal alignment  of the right hip; prosthesis.  Hemoglobin on admission is 15.0, hematocrit  43.8, indices within normal limits.  On postoperative day #1, hemoglobin  9.7, postoperative day #2 9.7, postoperative day #3 10.1.  Pro-time on  admission was 12.8 seconds with INR of 0.9, PTT 29.  On the day of discharge  his pro-time was 14.1 seconds with INR of 1.1 on Coumadin therapy.  CMET on  admission was within normal limits.  Urinalysis on admission was within  normal limits.   HOSPITAL  COURSE:  The patient underwent right total hip arthroplasty as well  described in Dr. Luiz Blare' operative note.  Postoperatively, he was put on the  PCA morphine pump for pain control and Ancef 1 gram IV q.8h. x6 doses and  started on Coumadin antithrombolytic therapy per pharmacy protocol.  Physical therapy was ordered for walker ambulation weightbearing as  tolerated on the right.  On postoperative day #1, he was doing well, he was  resting comfortably.  He was taking fluids without difficulty.  His vital  signs were stable and he was afebrile.  His hemoglobin was 10.7 and his BMET  was within normal limits.  On postoperative day #2, he was in good spirits.  He was doing well.  Foley catheter was removed and he was voiding without  difficulty.  He spiked a fever up to 100.4.  His hip wound was benign and  dressing was changed.  His hemoglobin was 9.7.  On postoperative day #3, he  was making good progress with physical therapy.  IV was converted to a  heparin lock and his PCA morphine pump was discontinued.  On postoperative  day #4, he was doing well.  He stated I am ready to go home.  He was  taking fluids and voiding without difficulty.  He had a temperature of 99.5.  He had very mild serous drainage from his right hip.  No sign of purulence  whatsoever. His INR was 1.1.  He was discharged home in improved condition  on a regular diet.  He was given Percocet #40 p.r.n. for pain, Lovenox 30 mg  subcu b.i.d. x2 days, and Coumadin  antithrombolytic therapy per pharmacy protocol x1 month postoperatively.  This will be managed on a home health regimen per his home health agency.  The patient will follow up with Dr. Luiz Blare in two weeks. He will ambulate  with a walker weightbearing as tolerated on the right. He will call us if  any problems occur.     Marshia Ly, P.A.                       Harvie Junior, M.D.    Cordelia Pen  D:  01/23/2002  T:  01/24/2002  Job:  [id number]   cc:   Montey Hora, P.A.C.

## 2010-07-01 NOTE — Cardiovascular Report (Signed)
NAME:  [name], [name] NO.:  [id number]   MEDICAL RECORD NO.:  [id number]          PATIENT TYPE:  INP   LOCATION:  1827                         FACILITY:  [facility]   PHYSICIAN:  Carole Binning, M.D. LHCDATE OF BIRTH:  [birth year]   DATE OF PROCEDURE:  12/28/2003  DATE OF DISCHARGE:                              CARDIAC CATHETERIZATION   PROCEDURE PERFORMED:  1.  Percutaneous coronary intervention and placement of a drug-eluting stent      in the mid left anterior descending artery.  2.  Intravascular ultrasound of the left circumflex coronary artery.   INDICATIONS FOR PROCEDURE:  [name] is a 65 year old male who was admitted  with chest pain compatible with unstable angina.  Cardiac catheterization  performed by Dr. Eden Emms revealed the presence of an 80% stenosis in the mid  left anterior descending artery.  There was moderate but non-obstructive  disease in the right coronary artery and mild atherosclerotic disease in the  left circumflex coronary artery.  After review of the images, we opted to  proceed with percutaneous coronary intervention.  In any addition, the  patient was enrolled in the Stradivarius trial unless we plan on proceeding  with intravascular ultrasound of the left circumflex coronary artery.   PROCEDURE NOTE:  We utilized pre-existing 6-French sheath in the right  femoral artery.  Heparin and Integrilin were administered per protocol.  We  used a 6-French JL-4 Guidant catheter.  We initially treated in the left  anterior descending artery.  An Isahi soft coronary guide wire was advanced  under fluoroscopic guidance into the distal LAD.  We then positioned a 3.0 x  12 mm Taxus drug-eluting stent across the diseased segment vessel.  The  stent was deployed at 11 atmospheres.  Following this, we went back in with  a 3.0 x 12 mm Quantum balloon and inflated this to 17 atmospheres within the  stent.  Intermittent doses of intracoronary  nitroglycerin were administered.  Final angiographic images were obtained revealing patency of the left  anterior descending artery with 0 percent residual stenosis at the stent  site and TIMI-3 flow.   We then redirected our guide wire into the distal left circumflex coronary  artery.  Intravascular ultrasound was performed with the Galaxy-2  intravascular ultrasound system with images recorded on automatic pull-back.  This revealed mild to moderate diffuse but non-obstructive atherosclerosis  in the left circumflex coronary artery.  Following this, the IVIS catheter  and coronary guide wire were removed.  Finally, angiographic images were  obtained revealing no evidence of vessel damage in the left circumflex  coronary artery.   COMPLICATIONS:  None.   RESULTS:  Successful placement of a drug-eluting stent in the mid left  anterior descending artery.  An 80% stenosis was reduced to 0 percent  residual with TIMI-3 flow.   PLAN:  Integrilin will be continued for 18 hours.  It has been recommended  that the patient be treated with a combination of aspirin and Plavix for 12  months followed by lifelong aspirin therapy.       MWP/MEDQ  D:  12/28/2003  T:  12/28/2003  Job:  [id number]

## 2010-07-01 NOTE — H&P (Signed)
NAME:  EXODUS, KUTZER NO.:  [id number]   MEDICAL RECORD NO.:  [id number]          PATIENT TYPE:  INP   LOCATION:                               FACILITY:  [facility]   PHYSICIAN:  Michaelyn Barter, M.D. DATE OF BIRTH:  08-06-1945   DATE OF ADMISSION:  12/28/2003  DATE OF DISCHARGE:                                HISTORY & PHYSICAL   PRIMARY CARE PHYSICIAN:  Unassigned.   CHIEF COMPLAINT:  Chest pain.   HISTORY OF PRESENT ILLNESS:  The patient is a 65 year old gentleman who  states that last Wednesday or Thursday he developed chest pain. He thought  it was indigestion at that particular time, said it came and went initially;  however, later he stated that his chest pain has been constant since its  initial onset last Thursday or Wednesday; however, he stated that because of  his other daily activities he was able to distract his mind away from the  chest pain. He said that this morning around 2 a.m. he developed a chest  pain which awakened him from his sleep. He was also diaphoretic at that  time. The pain was located over the left chest area. It was accompanied by a  shooting pain down the left arm. It did not travel to his neck, however. He  did state that he believed there may have been some radiation to his back.  The chest pain is described as dull. Initially, it was approximately 6 out  of 10 earlier this morning; however, currently it is approximately 2 to 3.  When the patient had his chest pain last week, he became lightheaded, and  again he states that the pain has been constant since its initial onset last  Thursday; however, the intensity does change throughout the course of the  day. He states that the nitroglycerin provided to him in the emergency room  today has provided him significant relief of his symptoms. He is vague with  regards to the relationship of exertion versus rest, and their relationship  to chest pain. He denies orthopnea, no PND,  positive nausea earlier today  when the chest pain initially began.   PAST MEDICAL HISTORY:  1.  Right hip degenerative joint disease.  2.  No hypertension.  3.  No diabetes.   PAST SURGICAL HISTORY:  1.  Right total hip arthroplasty by Dr. Jodi Geralds November 22, 2001.  2.  Right distal leg fracture repair.  3.  Laparoscopic umbilical hernia repair.   ALLERGIES:  No known drug allergies.   HOME MEDICATIONS:  Baby aspirin.   SOCIAL HISTORY:  Cigarettes:  One pack lasts approximately 2 days. The  patient has smoked since the age of 1. Alcohol:  The patient consumes 6 to  7 beers per day over the course of 3 to 4 days out of the week.   FAMILY HISTORY:  Father has history of diabetes mellitus. Maternal  grandmother has history of diabetes mellitus. Sister has experienced 2 heart  attacks, the first one at the age of 23, the second one at the age of 102.  Mother has a questionable breathing condition.   REVIEW OF SYSTEMS:  As per HPI. Otherwise, all other systems are negative.   PHYSICAL EXAMINATION:  GENERAL:  The patient is cooperative.  VITAL SIGNS:  At the time of admission, temperature 96.8, heart rate 52,  respirations 20, blood pressure 145/81.  HEENT:  Anicteric. Extraocular movements are intact.  NECK:  Good bilateral carotid upstroke. No carotid bruits.  CARDIAC:  S1 and S2 present. Regular rate and rhythm. No parasternal heave.  Positive nondisplaced PMI is palpable at the mid clavicular ridge at  approximately the fourth intercostal space.  RESPIRATORY:  Lungs are clear bilaterally. No crackles. No wheezes.  ABDOMEN:  Scaphoid, soft, nontender, nondistended. Positive bowel sounds. No  hepatosplenomegaly.  EXTREMITIES:  There is trace distal right ankle edema.  MUSCULOSKELETAL:  5/5 upper and lower extremity strength.  NEUROLOGICAL:  Cranial nerves II-XII are intact. The patient is alert and  oriented x3.   LABORATORY DATA:  Troponin I POC less than 0.05, CK-MB POC  3.1, myoglobin  60.5. EKG is interpreted as sinus bradycardia with occasional PVCs,  increased R to S ratio in leads V1, consider early transitional or posterior  infarct. Chest x-ray is interpreted as minimal vascular crowding and  bibasilar atelectasis.   ASSESSMENT/PLAN:  1.  Chest pain. Etiology slightly atypical in that the patient describes the      pain as being constant since last Wednesday or Thursday; however, the      patient does have multiple risk factors including family history      involving a sibling who has experienced 2 MIs at ages younger than the      patient. Likewise, the patient does have a significant history of      cigarette smoking which predisposes him to coronary artery disease.      Therefore, will rule out cardiac etiology as the source of the patient's      current chest pain. Will check a troponin I and CK-MB x3 q.8h. apart.      Will also provide aspirin 325 mg q.d., oxygen via nasal cannula,      morphine once 2 mg IV q.4h. p.r.n. for pain, sublingual nitroglycerin      will also be provided. Will start IV heparin drip and will order a 2-D      echocardiogram. Will also consult cardiology for further evaluation and      recommendation. The patient's states that he believes he may have had a      stress test a couple of months ago; however, he is vague with regards to      the results of the echocardiogram and also vague with regards to      previous evaluation by cardiology, so again will consult cardiology for      further recommendations.  2.  Blood pressure. This was slightly elevated on admission; however,      currently, the patient's blood pressure is controlled. Likewise, the      patient denies having a history of hypertension. Will monitor his blood      pressure for now. May consider beta blocker later if needed. Again,      cardiology will help to address this situation.  3.  Gastrointestinal prophylaxis. Protonix 40 mg p.o. q.d. will be  provided.      Orla   OR/MEDQ  D:  12/28/2003  T:  12/28/2003  Job:  [id number]

## 2010-07-01 NOTE — Op Note (Signed)
NAME:  [name], [name]                          ACCOUNT NO.:  [id number]   MEDICAL RECORD NO.:  [id number]                   PATIENT TYPE:  INP   LOCATION:  5003                                 FACILITY:  [facility]   PHYSICIAN:  Harvie Junior, M.D.                DATE OF BIRTH:  Dec 23, 1945   DATE OF PROCEDURE:  12/27/2001  DATE OF DISCHARGE:  11/26/2001                                 OPERATIVE REPORT   PREOPERATIVE DIAGNOSIS:  Degenerative joint disease, right hip.   POSTOPERATIVE DIAGNOSIS:  Degenerative joint disease, right hip.   PROCEDURE:  Right total hip arthroplasty with DePuy S-ROM stem, Pinnacle cup  size #58, 32 mm ball with a +4 acetabular liner and a standard #2015 stem,  with a 46F large spout, and a 32 mm +6 ball.   SURGEON:  Harvie Junior, M.D.   ASSISTANT:  Marshia Ly, P.A.   ANESTHESIA:  General.   INDICATIONS FOR PROCEDURE:  The patient is a 65 year old male with a long  history of having severe degenerative joint disease of the hip.  He  ultimately was evaluated and placed on conservative care, including the use  of a cane, anti-inflammatory medication, and __________ modification.  He  ultimately failed all of these modalities, and is brought to the operating  room for a right total hip arthroplasty.   DESCRIPTION OF PROCEDURE:  The patient is brought to the operating room.  After adequate anesthesia was obtained with a general anesthetic, the  patient was placed on the operating room table.  He was then moved to the  left lateral decubitus position.  All bony prominences were well-padded.  Attention was then turned to the right hip where an incision was made for a  posterior approach to the right hip.  The subcutaneous tissue was dissected  down to the level of the tensor fascia which was divided down to its fibers,  divided proximally under the gluteus maximus, and then the muscle was finger-  fractured and a Charnley retractor was put in place.   Following this,  attention was turned to the posterior aspect of the hip, where an exposure  was made, tagging the piriformis, the external rotators, the posterior  capsule, and the socket was identified.  The socket was then sequentially  reamed to a level of 57, and after this a 58 mm cup was hammered into place,  and the appropriate 45 degrees of inclination and 10 degrees of anteversion.  At this point a trial liner was put in place, and attention was turned to  the femoral side where the canal was, after opening with a cookie cutter,  was sequentially reamed up to a level of 15.  Excellent __________ was  achieved at 15, and the reamer #[id number] was taken down three-quarters of the  way.  At this point attention was turned back up to the stem where a  cone  was placed.  It was a #73F large spouted cone, and following this the trial  was put in place, along with the #2015 stem, a +6 ball, and a +4 lateral  acetabular liner were used.  Excellent stability and range of motion were  achieved at this point.  Following this the trial liner was removed.  Hole  eliminators were put in place.  The trial stem was removed, and the hip was  copiously irrigated and suctioned dry.  The final components were put in  place.  The stem was put in place first with the #54F large stem.  The #2015  stem was then put in place at neutral version, and a +6 head and a lateral  off-setting stem was used and a 32 mm ball was then put in place.  At this  point the hip was put through a range of motion and felt to be stable.  The  wound was copiously irrigated and suctioned dry.  The short external  rotators, piriformis and the posterior capsule were repaired at the  trochanteric ridge with three drill holes.  The tensor fascia was then  closed with #1 Vicryl running suture.  The subcutaneous was  closed with 2-0 Vicryl, and the skin with skin staples.  A compressive  dressing was applied, and the patient was taken to  the recovery room and was  noted to be in satisfactory condition.  The estimated blood loss for the  procedure was about 400 cc.                                                Harvie Junior, M.D.    Ranae Plumber  D:  12/27/2001  T:  12/27/2001  Job:  [id number]

## 2010-07-22 ENCOUNTER — Ambulatory Visit (INDEPENDENT_AMBULATORY_CARE_PROVIDER_SITE_OTHER): Payer: Medicare Other | Admitting: Cardiology

## 2010-07-22 ENCOUNTER — Encounter: Payer: Self-pay | Admitting: *Deleted

## 2010-07-22 DIAGNOSIS — I2510 Atherosclerotic heart disease of native coronary artery without angina pectoris: Secondary | ICD-10-CM

## 2010-07-22 NOTE — Progress Notes (Signed)
Exercise Treadmill Test  Pre-Exercise Testing Evaluation Rhythm: sinus bradycardia  Rate: 59   PR:  .16 QRS:  .08  QT:  .39 QTc: .39     Test  Exercise Tolerance Test Ordering MD: Angelina Sheriff, MD  Interpreting MD:  Angelina Sheriff, MD  Unique Test No: 1  Treadmill:  1  Indication for ETT: CAD  Contraindication to ETT: No   Stress Modality: exercise - treadmill  Cardiac Imaging Performed: non   Protocol: standard Bruce - maximal  Max BP:  188/57  Max MPHR (bpm):  155 85% MPR (bpm):  131  MPHR obtained (bpm):  153 % MPHR obtained:  96  Reached 85% MPHR (min:sec):  6:28 Total Exercise Time (min-sec):  9:00  Workload in METS:  10.1 Borg Scale: 15  Reason ETT Terminated:  desired heart rate attained    ST Segment Analysis At Rest: normal ST segments - no evidence of significant ST depression With Exercise: no evidence of significant ST depression  Other Information Arrhythmia:  No Angina during ETT:  absent (0) Quality of ETT:  diagnostic  ETT Interpretation:  normal - no evidence of ischemia by ST analysis  Comments: The patient had an excellent exercise tolerance.  There was no chest pain.  There was an appropriate level of dyspnea.  There were no arrhythmias, a normal heart rate response and normal BP response.  There were no ischemic ST T wave changes and a normal heart rate recovery.  Recommendations: Negative adequate ETT.  No further testing is indicated.  Based on the above I gave the patient a prescription for exercise.

## 2010-07-27 ENCOUNTER — Encounter (INDEPENDENT_AMBULATORY_CARE_PROVIDER_SITE_OTHER): Payer: Medicare Other | Admitting: *Deleted

## 2010-07-27 ENCOUNTER — Other Ambulatory Visit: Payer: Self-pay | Admitting: *Deleted

## 2010-07-27 DIAGNOSIS — I6529 Occlusion and stenosis of unspecified carotid artery: Secondary | ICD-10-CM

## 2010-07-28 ENCOUNTER — Encounter: Payer: Self-pay | Admitting: Cardiology

## 2010-08-10 ENCOUNTER — Telehealth: Payer: Self-pay | Admitting: Cardiology

## 2010-08-10 MED ORDER — CLOPIDOGREL BISULFATE 75 MG PO TABS: 75.0000 mg | ORAL_TABLET | Freq: Every day | ORAL | Status: DC

## 2010-08-10 NOTE — Telephone Encounter (Signed)
plavix 75 mg [facility]

## 2010-08-11 ENCOUNTER — Other Ambulatory Visit: Payer: Self-pay | Admitting: *Deleted

## 2010-08-11 MED ORDER — CLOPIDOGREL BISULFATE 75 MG PO TABS: 75.0000 mg | ORAL_TABLET | Freq: Every day | ORAL | Status: DC

## 2010-11-25 LAB — CBC
Hemoglobin: 13.5
MCHC: 34.5
MCHC: 34.9
MCV: 93.4
MCV: 94
Platelets: 222
RBC: 4.16 — ABNORMAL LOW
WBC: 6.4

## 2010-11-25 LAB — I-STAT 8, (EC8 V) (CONVERTED LAB)
Chloride: 107
Glucose, Bld: 104 — ABNORMAL HIGH
Potassium: 4.3
TCO2: 24
pH, Ven: 7.418 — ABNORMAL HIGH

## 2010-11-25 LAB — COMPREHENSIVE METABOLIC PANEL
Albumin: 3.8
BUN: 11
Creatinine, Ser: 0.61
Total Protein: 6.8

## 2010-11-25 LAB — CARDIAC PANEL(CRET KIN+CKTOT+MB+TROPI)
CK, MB: 1.8
CK, MB: 2.2
CK, MB: 2.6
Total CK: 54
Total CK: 55
Troponin I: 0.01
Troponin I: 0.02

## 2010-11-25 LAB — DIFFERENTIAL
Basophils Relative: 0
Eosinophils Absolute: 0.4
Neutrophils Relative %: 58

## 2010-11-25 LAB — LIPID PANEL
LDL Cholesterol: 95
Total CHOL/HDL Ratio: 3.4
VLDL: 23

## 2010-11-25 LAB — POCT CARDIAC MARKERS
Myoglobin, poc: 34.6
Operator id: 285491

## 2010-11-25 LAB — POCT I-STAT CREATININE: Operator id: 285491

## 2011-01-27 ENCOUNTER — Encounter (HOSPITAL_COMMUNITY): Payer: Self-pay

## 2011-01-27 ENCOUNTER — Emergency Department (HOSPITAL_COMMUNITY)
Admission: EM | Admit: 2011-01-27 | Discharge: 2011-01-27 | Disposition: A | Discharge status: Home / Self Care | Payer: Medicare Other | Attending: Emergency Medicine | Admitting: Emergency Medicine

## 2011-01-27 DIAGNOSIS — M1990 Unspecified osteoarthritis, unspecified site: Secondary | ICD-10-CM | POA: Insufficient documentation

## 2011-01-27 DIAGNOSIS — I6529 Occlusion and stenosis of unspecified carotid artery: Secondary | ICD-10-CM | POA: Insufficient documentation

## 2011-01-27 DIAGNOSIS — S61459A Open bite of unspecified hand, initial encounter: Secondary | ICD-10-CM

## 2011-01-27 DIAGNOSIS — W540XXA Bitten by dog, initial encounter: Secondary | ICD-10-CM | POA: Insufficient documentation

## 2011-01-27 DIAGNOSIS — S61409A Unspecified open wound of unspecified hand, initial encounter: Secondary | ICD-10-CM | POA: Insufficient documentation

## 2011-01-27 DIAGNOSIS — E785 Hyperlipidemia, unspecified: Secondary | ICD-10-CM | POA: Insufficient documentation

## 2011-01-27 DIAGNOSIS — I2510 Atherosclerotic heart disease of native coronary artery without angina pectoris: Secondary | ICD-10-CM | POA: Insufficient documentation

## 2011-01-27 DIAGNOSIS — Z87891 Personal history of nicotine dependence: Secondary | ICD-10-CM | POA: Insufficient documentation

## 2011-01-27 DIAGNOSIS — Z7982 Long term (current) use of aspirin: Secondary | ICD-10-CM | POA: Insufficient documentation

## 2011-01-27 DIAGNOSIS — Z23 Encounter for immunization: Secondary | ICD-10-CM | POA: Insufficient documentation

## 2011-01-27 DIAGNOSIS — F341 Dysthymic disorder: Secondary | ICD-10-CM | POA: Insufficient documentation

## 2011-01-27 MED ORDER — AMOXICILLIN-POT CLAVULANATE 875-125 MG PO TABS
1.0000 | ORAL_TABLET | Freq: Once | ORAL | Status: AC
  Administered 2011-01-27: 1 via ORAL
  Filled 2011-01-27: qty 1

## 2011-01-27 MED ORDER — BACITRACIN ZINC 500 UNIT/GM EX OINT
TOPICAL_OINTMENT | CUTANEOUS | Status: AC
  Filled 2011-01-27: qty 0.9

## 2011-01-27 MED ORDER — HYDROCODONE-ACETAMINOPHEN 5-325 MG PO TABS: 1.0000 | ORAL_TABLET | ORAL | Status: AC | PRN

## 2011-01-27 MED ORDER — AMOXICILLIN-POT CLAVULANATE 875-125 MG PO TABS: 1.0000 | ORAL_TABLET | Freq: Two times a day (BID) | ORAL | Status: AC

## 2011-01-27 MED ORDER — TETANUS-DIPHTH-ACELL PERTUSSIS 5-2.5-18.5 LF-MCG/0.5 IM SUSP
0.5000 mL | Freq: Once | INTRAMUSCULAR | Status: AC
  Administered 2011-01-27: 0.5 mL via INTRAMUSCULAR
  Filled 2011-01-27: qty 0.5

## 2011-01-27 NOTE — ED Notes (Signed)
Pt speaking with animal control at this time regarding animal bite.

## 2011-01-27 NOTE — ED Provider Notes (Signed)
Scribed for Donnetta Hutching, MD, the patient was seen in room APA16A/APA16A . This chart was scribed by Ellie Lunch.   CSN: [id number] Arrival date & time: 01/27/2011  3:18 PM   First MD Initiated Contact with Patient 01/27/11 1550      Chief Complaint  Patient presents with  . Animal Bite    (Consider location/radiation/quality/duration/timing/severity/associated sxs/prior treatment) HPI [name] is a 65 y.o. male who presents to the Emergency Department complaining of animal bite. Pt was bit by his fathers poodle on his right hand this morning. Pt c/o of associated pain to r. Hand. Pt is unsure of  his last tetanus. The offending poodle's shots are up to date.   PCP Dr. Christell Constant  Past Medical History  Diagnosis Date  . Coronary artery disease     s/p Taxus stenting to the LAD.  This was 2005. Catheterization in 2008, demonstrated a proximal calcification with ostial 40-50% stenosis, it was a widely patent stent, the circumflex had 40%  proximal stenosis,  the right coronary artery is large and dominant with minor nonobstructive plaque, the EF was 55%.  . Degenerative joint disease   . Depression with anxiety   . Dyslipidemia   . Sinus bradycardia   . Carotid stenosis     Past Surgical History  Procedure Date  . Orthopedic surgery     right foot and right hip secondary to DJD    Family History  Problem Relation Age of Onset  . COPD Mother   . Coronary artery disease Father     stent  . Diabetes Father   . Coronary artery disease Other     s/p MI    History  Substance Use Topics  . Smoking status: Former Smoker    Quit date: 02/14/2003  . Smokeless tobacco: Not on file  . Alcohol Use: No     quit completely in 2005     Review of Systems 10 Systems reviewed and are negative for acute change except as noted in the HPI.  Allergies  Review of patient's allergies indicates no known allergies.  Home Medications   Current Outpatient Rx  Name Route Sig  Dispense Refill  . ASPIRIN EC 81 MG PO TBEC Oral Take 81 mg by mouth every morning.      . ATORVASTATIN CALCIUM 20 MG PO TABS Oral Take 20 mg by mouth at bedtime.     Marland Kitchen VITAMIN D 2000 UNITS PO CAPS Oral Take 1 capsule by mouth daily.      Marland Kitchen CLOPIDOGREL BISULFATE 75 MG PO TABS Oral Take 75 mg by mouth every morning.      Marland Kitchen ESCITALOPRAM OXALATE 10 MG PO TABS Oral Take 10 mg by mouth every morning.      . MULTI-VITAMIN/MINERALS PO TABS Oral Take 1 tablet by mouth daily.      Marland Kitchen NITROGLYCERIN 0.4 MG SL SUBL Sublingual Place 0.4 mg under the tongue every 5 (five) minutes as needed.        BP 141/73  Pulse 64  Temp(Src) 97.9 F (36.6 C) (Oral)  Resp 18  Ht 5\' 11"  (1.803 m)  Wt 215 lb (97.523 kg)  BMI 29.99 kg/m2  SpO2 97%  Physical Exam  Nursing note and vitals reviewed. Constitutional: He is oriented to person, place, and time. He appears well-developed and well-nourished.  HENT:  Head: Normocephalic and atraumatic.  Eyes: Conjunctivae and EOM are normal. Pupils are equal, round, and reactive to light.  Neck: Normal range of  motion. Neck supple.  Cardiovascular: Normal rate and regular rhythm.   Pulmonary/Chest: Effort normal and breath sounds normal.  Abdominal: Soft. Bowel sounds are normal.  Musculoskeletal: Normal range of motion.  Neurological: He is alert and oriented to person, place, and time.  Skin: Skin is warm and dry.       On Dorsal aspect of 3rd metacarpal bone a 2cm superficial "v" shaped laceration. In web space btwn 2nd and 3rd digit 4mm superficial laceration.   Psychiatric: He has a normal mood and affect.    ED Course  Procedures (including critical care time) DIAGNOSTIC STUDIES: Oxygen Saturation is 97% on room air, normal by my interpretation.    COORDINATION OF CARE:   4:25 PM Plan to update tetanus. Plan to discharge with abx. Recommended to soak in hot salty water to help reduce risk of infection.   No diagnosis found.    MDM  Wounds do not need  suturing. Discussed possibility of infection. Refer to Hydrographic surveyor. tetanus updated and Rx for Augmentin given.   I personally performed the services described in this documentation, which was scribed in my presence. The recorded information has been reviewed and considered.       Donnetta Hutching, MD 01/27/11 [phone]

## 2011-01-27 NOTE — ED Notes (Signed)
Pt has small lac noted to posterior right hand and small lac between index and middle finger of right hand.  Bleeding controlled.  Cleaned wounds with wound cleanser.  ROM/CMS intact in all fingers.  nad noted.

## 2011-01-27 NOTE — ED Notes (Signed)
Bacitracin ointment applied to wounds and band aids used for dressings.

## 2011-01-27 NOTE — ED Notes (Signed)
Pt presents with dog bite to right hand. Pt was bit by fathers dog. Dog is UTD on shots.

## 2011-07-19 ENCOUNTER — Other Ambulatory Visit: Payer: Self-pay | Admitting: Cardiology

## 2011-07-19 ENCOUNTER — Telehealth: Payer: Self-pay

## 2011-07-19 NOTE — Telephone Encounter (Signed)
Called Patient to make appointment

## 2011-07-19 NOTE — Telephone Encounter (Signed)
..   Requested Prescriptions   Pending Prescriptions Disp Refills  . clopidogrel (PLAVIX) 75 MG tablet [Pharmacy Med Name: CLOPIDOGREL 75 MG TABLET] 30 tablet 2    Sig: TAKE 1 TABLET (75 MG TOTAL) BY MOUTH DAILY.   

## 2011-08-18 ENCOUNTER — Encounter: Payer: Self-pay | Admitting: Cardiology

## 2011-08-18 ENCOUNTER — Ambulatory Visit (INDEPENDENT_AMBULATORY_CARE_PROVIDER_SITE_OTHER): Payer: Medicare Other | Admitting: Cardiology

## 2011-08-18 VITALS — BP 116/80 | HR 56 | Ht 71.0 in | Wt 226.4 lb

## 2011-08-18 DIAGNOSIS — I6529 Occlusion and stenosis of unspecified carotid artery: Secondary | ICD-10-CM

## 2011-08-18 DIAGNOSIS — I2510 Atherosclerotic heart disease of native coronary artery without angina pectoris: Secondary | ICD-10-CM

## 2011-08-18 DIAGNOSIS — E785 Hyperlipidemia, unspecified: Secondary | ICD-10-CM

## 2011-08-18 NOTE — Assessment & Plan Note (Signed)
This is followed closely by Rudi Heap, MD

## 2011-08-18 NOTE — Patient Instructions (Addendum)
Your physician wants you to follow-up in: 1 year. You will receive a reminder letter in the mail two months in advance. If you don't receive a letter, please call our office to schedule the follow-up appointment.  

## 2011-08-18 NOTE — Assessment & Plan Note (Signed)
This was very mild and will be checked again next year.

## 2011-08-18 NOTE — Assessment & Plan Note (Signed)
The patient has no new sypmtoms.  No further cardiovascular testing is indicated.  We will continue with aggressive risk reduction and meds as listed.  

## 2011-08-18 NOTE — Progress Notes (Signed)
   HPI The patient presents with a one year followup. Since I last saw him he has had no new cardiovascular complaints. He remains somewhat active doing some walking and lots of yardwork. With this he denies any chest pressure, neck or arm discomfort. He has no palpitations, presyncope or syncope. He has no shortness of breath, weight gain or edema. He has had swelling cough or fever.   No Known Allergies  Current Outpatient Prescriptions  Medication Sig Dispense Refill  . aspirin EC 81 MG tablet Take 81 mg by mouth every morning.        Marland Kitchen atorvastatin (LIPITOR) 20 MG tablet Take 20 mg by mouth at bedtime.       . Cholecalciferol (VITAMIN D) 2000 UNITS CAPS Take 1 capsule by mouth daily.        . clopidogrel (PLAVIX) 75 MG tablet Take 75 mg by mouth every morning.        . escitalopram (LEXAPRO) 10 MG tablet Take 10 mg by mouth every morning.        Marland Kitchen LORazepam (ATIVAN) 0.5 MG tablet Take 0.5 mg by mouth every 8 (eight) hours.      . Multiple Vitamins-Minerals (MULTIVITAMIN WITH MINERALS) tablet Take 1 tablet by mouth daily.        . nitroGLYCERIN (NITROSTAT) 0.4 MG SL tablet Place 0.4 mg under the tongue every 5 (five) minutes as needed.          Past Medical History  Diagnosis Date  . Coronary artery disease     s/p Taxus stenting to the LAD.  This was 2005. Catheterization in 2008, demonstrated a proximal calcification with ostial 40-50% stenosis, it was a widely patent stent, the circumflex had 40%  proximal stenosis,  the right coronary artery is large and dominant with minor nonobstructive plaque, the EF was 55%.  . Degenerative joint disease   . Depression with anxiety   . Dyslipidemia   . Sinus bradycardia   . Carotid stenosis     Past Surgical History  Procedure Date  . Orthopedic surgery     right foot and right hip secondary to DJD    ROS: As stated in the HPI and negative for all other systems.  PHYSICAL EXAM BP 116/80  Pulse 56  Ht 5\' 11"  (1.803 m)  Wt 226 lb 6.4  oz (102.694 kg)  BMI 31.58 kg/m2 GENERAL:  Well appearing NECK:  No jugular venous distention, waveform within normal limits, carotid upstroke brisk and symmetric, no bruits, no thyromegaly LYMPHATICS:  No cervical, inguinal adenopathy LUNGS:  Clear to auscultation bilaterally BACK:  No CVA tenderness CHEST:  Unremarkable HEART:  PMI not displaced or sustained,S1 and S2 within normal limits, no S3, no S4, no clicks, no rubs, no murmurs ABD:  Flat, positive bowel sounds normal in frequency in pitch, no bruits, no rebound, no guarding, no midline pulsatile mass, no hepatomegaly, no splenomegaly EXT:  2 plus pulses throughout, no edema, no cyanosis no clubbing   EKG:  Sinus rhythm, rate 56, axis within normal limits, intervals within normal limits, no acute ST-T wave changes.  08/18/2011   ASSESSMENT AND PLAN

## 2011-11-07 ENCOUNTER — Other Ambulatory Visit: Payer: Self-pay | Admitting: Cardiology

## 2011-11-07 NOTE — Telephone Encounter (Signed)
..   Requested Prescriptions   Pending Prescriptions Disp Refills  . clopidogrel (PLAVIX) 75 MG tablet [Pharmacy Med Name: CLOPIDOGREL 75 MG TABLET] 30 tablet 2    Sig: TAKE 1 TABLET (75 MG TOTAL) BY MOUTH DAILY.

## 2012-01-29 ENCOUNTER — Other Ambulatory Visit: Payer: Self-pay | Admitting: Cardiology

## 2012-04-27 ENCOUNTER — Other Ambulatory Visit: Payer: Self-pay | Admitting: Cardiology

## 2012-06-19 ENCOUNTER — Other Ambulatory Visit: Payer: Self-pay | Admitting: Nurse Practitioner

## 2012-07-22 ENCOUNTER — Other Ambulatory Visit: Payer: Self-pay | Admitting: Nurse Practitioner

## 2012-08-02 ENCOUNTER — Other Ambulatory Visit: Payer: Self-pay | Admitting: Nurse Practitioner

## 2012-08-02 ENCOUNTER — Other Ambulatory Visit: Payer: Self-pay | Admitting: Cardiology

## 2012-08-02 ENCOUNTER — Encounter (INDEPENDENT_AMBULATORY_CARE_PROVIDER_SITE_OTHER): Payer: Medicare Other

## 2012-08-02 DIAGNOSIS — I6529 Occlusion and stenosis of unspecified carotid artery: Secondary | ICD-10-CM

## 2012-08-21 ENCOUNTER — Other Ambulatory Visit: Payer: Self-pay | Admitting: Nurse Practitioner

## 2012-08-23 ENCOUNTER — Ambulatory Visit (INDEPENDENT_AMBULATORY_CARE_PROVIDER_SITE_OTHER): Payer: Medicare Other | Admitting: Nurse Practitioner

## 2012-08-23 ENCOUNTER — Encounter: Payer: Self-pay | Admitting: Nurse Practitioner

## 2012-08-23 VITALS — BP 130/73 | HR 58 | Temp 98.1°F | Ht 71.0 in | Wt 222.0 lb

## 2012-08-23 DIAGNOSIS — F411 Generalized anxiety disorder: Secondary | ICD-10-CM

## 2012-08-23 DIAGNOSIS — F32A Depression, unspecified: Secondary | ICD-10-CM

## 2012-08-23 DIAGNOSIS — I25810 Atherosclerosis of coronary artery bypass graft(s) without angina pectoris: Secondary | ICD-10-CM

## 2012-08-23 DIAGNOSIS — F329 Major depressive disorder, single episode, unspecified: Secondary | ICD-10-CM

## 2012-08-23 DIAGNOSIS — E785 Hyperlipidemia, unspecified: Secondary | ICD-10-CM

## 2012-08-23 MED ORDER — LORAZEPAM 0.5 MG PO TABS: 0.5000 mg | ORAL_TABLET | Freq: Three times a day (TID) | ORAL | Status: DC

## 2012-08-23 MED ORDER — ESCITALOPRAM OXALATE 10 MG PO TABS: 10.0000 mg | ORAL_TABLET | Freq: Every day | ORAL | Status: DC

## 2012-08-23 MED ORDER — ATORVASTATIN CALCIUM 20 MG PO TABS: 20.0000 mg | ORAL_TABLET | Freq: Every day | ORAL | Status: DC

## 2012-08-23 MED ORDER — CLOPIDOGREL BISULFATE 75 MG PO TABS: 75.0000 mg | ORAL_TABLET | ORAL | Status: DC

## 2012-08-23 NOTE — Patient Instructions (Signed)
Health Maintenance, Males A healthy lifestyle and preventative care can promote health and wellness.  Maintain regular health, dental, and eye exams.  Eat a healthy diet. Foods like vegetables, fruits, whole grains, low-fat dairy products, and lean protein foods contain the nutrients you need without too many calories. Decrease your intake of foods high in solid fats, added sugars, and salt. Get information about a proper diet from your caregiver, if necessary.  Regular physical exercise is one of the most important things you can do for your health. Most adults should get at least 150 minutes of moderate-intensity exercise (any activity that increases your heart rate and causes you to sweat) each week. In addition, most adults need muscle-strengthening exercises on 2 or more days a week.   Maintain a healthy weight. The body mass index (BMI) is a screening tool to identify possible weight problems. It provides an estimate of body fat based on height and weight. Your caregiver can help determine your BMI, and can help you achieve or maintain a healthy weight. For adults 20 years and older:  A BMI below 18.5 is considered underweight.  A BMI of 18.5 to 24.9 is normal.  A BMI of 25 to 29.9 is considered overweight.  A BMI of 30 and above is considered obese.  Maintain normal blood lipids and cholesterol by exercising and minimizing your intake of saturated fat. Eat a balanced diet with plenty of fruits and vegetables. Blood tests for lipids and cholesterol should begin at age 20 and be repeated every 5 years. If your lipid or cholesterol levels are high, you are over 50, or you are a high risk for heart disease, you may need your cholesterol levels checked more frequently.Ongoing high lipid and cholesterol levels should be treated with medicines, if diet and exercise are not effective.  If you smoke, find out from your caregiver how to quit. If you do not use tobacco, do not start.  If you  choose to drink alcohol, do not exceed 2 drinks per day. One drink is considered to be 12 ounces (355 mL) of beer, 5 ounces (148 mL) of wine, or 1.5 ounces (44 mL) of liquor.  Avoid use of street drugs. Do not share needles with anyone. Ask for help if you need support or instructions about stopping the use of drugs.  High blood pressure causes heart disease and increases the risk of stroke. Blood pressure should be checked at least every 1 to 2 years. Ongoing high blood pressure should be treated with medicines if weight loss and exercise are not effective.  If you are 45 to 68 years old, ask your caregiver if you should take aspirin to prevent heart disease.  Diabetes screening involves taking a blood sample to check your fasting blood sugar level. This should be done once every 3 years, after age 45, if you are within normal weight and without risk factors for diabetes. Testing should be considered at a younger age or be carried out more frequently if you are overweight and have at least 1 risk factor for diabetes.  Colorectal cancer can be detected and often prevented. Most routine colorectal cancer screening begins at the age of 50 and continues through age 75. However, your caregiver may recommend screening at an earlier age if you have risk factors for colon cancer. On a yearly basis, your caregiver may provide home test kits to check for hidden blood in the stool. Use of a small camera at the end of a tube,   to directly examine the colon (sigmoidoscopy or colonoscopy), can detect the earliest forms of colorectal cancer. Talk to your caregiver about this at age 50, when routine screening begins. Direct examination of the colon should be repeated every 5 to 10 years through age 75, unless early forms of pre-cancerous polyps or small growths are found.  Hepatitis C blood testing is recommended for all people born from 1945 through 1965 and any individual with known risks for hepatitis C.  Healthy  men should no longer receive prostate-specific antigen (PSA) blood tests as part of routine cancer screening. Consult with your caregiver about prostate cancer screening.  Testicular cancer screening is not recommended for adolescents or adult males who have no symptoms. Screening includes self-exam, caregiver exam, and other screening tests. Consult with your caregiver about any symptoms you have or any concerns you have about testicular cancer.  Practice safe sex. Use condoms and avoid high-risk sexual practices to reduce the spread of sexually transmitted infections (STIs).  Use sunscreen with a sun protection factor (SPF) of 30 or greater. Apply sunscreen liberally and repeatedly throughout the day. You should seek shade when your shadow is shorter than you. Protect yourself by wearing long sleeves, pants, a wide-brimmed hat, and sunglasses year round, whenever you are outdoors.  Notify your caregiver of new moles or changes in moles, especially if there is a change in shape or color. Also notify your caregiver if a mole is larger than the size of a pencil eraser.  A one-time screening for abdominal aortic aneurysm (AAA) and surgical repair of large AAAs by sound wave imaging (ultrasonography) is recommended for ages 65 to 75 years who are current or former smokers.  Stay current with your immunizations. Document Released: 07/29/2007 Document Revised: 04/24/2011 Document Reviewed: 06/27/2010 [employer] Patient Information 2014 [employer].  

## 2012-08-23 NOTE — Progress Notes (Signed)
  Subjective:    Patient ID: [name], male    DOB: May 07, 1945, 67 y.o.   MRN: [id number]  Hyperlipidemia This is a chronic problem. The current episode started more than 1 year ago. The problem is uncontrolled. Recent lipid tests were reviewed and are high. There are no known factors aggravating his hyperlipidemia. Pertinent negatives include no chest pain or shortness of breath. Current antihyperlipidemic treatment includes statins. The current treatment provides moderate improvement of lipids. Compliance problems include adherence to diet and adherence to exercise.  Risk factors for coronary artery disease include male sex.  CAD- Carotid artery stenosis Plavix daily- hasn't needed to use his nitro in awhile- sees cardiologist every 6 months. Cardiologist recently done carotid dopler studies which show mild occlusion. Depression/GAD Patient currently on lexapro and ativan- doing well on combination- said that meds keep him calm and from being anxious. Vitamin D deficiency Vitamin d OTC daily- no side effects  Review of Systems  Constitutional: Negative for fatigue and unexpected weight change.  HENT: Negative.   Respiratory: Negative for cough, chest tightness and shortness of breath.   Cardiovascular: Negative for chest pain, palpitations and leg swelling.  Gastrointestinal: Negative for diarrhea and constipation.  Endocrine: Negative.   Genitourinary: Negative.   Neurological: Negative.   Psychiatric/Behavioral: Negative.        Objective:   Physical Exam  Constitutional: He is oriented to person, place, and time. He appears well-developed and well-nourished.  HENT:  Head: Normocephalic.  Right Ear: External ear normal.  Left Ear: External ear normal.  Nose: Nose normal.  Mouth/Throat: Oropharynx is clear and moist.  Eyes: EOM are normal. Pupils are equal, round, and reactive to light.  Neck: Normal range of motion. Neck supple. No thyromegaly present.  Cardiovascular:  Normal rate, regular rhythm, normal heart sounds and intact distal pulses.   No murmur heard. Faint bil carotid bruits  Pulmonary/Chest: Effort normal and breath sounds normal. He has no wheezes. He has no rales.  Abdominal: Soft. Bowel sounds are normal.  Genitourinary: Prostate normal and penis normal.  Musculoskeletal: Normal range of motion.  Neurological: He is alert and oriented to person, place, and time.  Skin: Skin is warm and dry.  Psychiatric: He has a normal mood and affect. His behavior is normal. Judgment and thought content normal.    BP 130/73  Pulse 58  Temp(Src) 98.1 F (36.7 C) (Oral)  Ht 5\' 11"  (1.803 m)  Wt 222 lb (100.699 kg)  BMI 30.98 kg/m2       Assessment & Plan:  1. Hyperlipidemia Low fat diet and exercise - atorvastatin (LIPITOR) 20 MG tablet; Take 1 tablet (20 mg total) by mouth daily.  Dispense: 30 tablet; Refill: 5 - COMPLETE METABOLIC PANEL WITH GFR - NMR Lipoprofile with Lipids  2. Depression Stress management - escitalopram (LEXAPRO) 10 MG tablet; Take 1 tablet (10 mg total) by mouth daily.  Dispense: 30 tablet; Refill: 5  3. GAD (generalized anxiety disorder) - LORazepam (ATIVAN) 0.5 MG tablet; Take 1 tablet (0.5 mg total) by mouth every 8 (eight) hours.  Dispense: 30 tablet; Refill: 2  4. CAD (coronary artery disease) of artery bypass graft Keep follow up appointment with cardiologist - clopidogrel (PLAVIX) 75 MG tablet; Take 1 tablet (75 mg total) by mouth every morning.  Dispense: 30 tablet; Refill: 5   Mary-Margaret Daphine Deutscher, FNP

## 2012-08-24 LAB — COMPLETE METABOLIC PANEL WITH GFR
ALT: 21 U/L (ref 0–53)
AST: 21 U/L (ref 0–37)
Alkaline Phosphatase: 63 U/L (ref 39–117)
Calcium: 9.5 mg/dL (ref 8.4–10.5)
Chloride: 107 mEq/L (ref 96–112)
Creat: 0.93 mg/dL (ref 0.50–1.35)

## 2012-08-26 LAB — NMR LIPOPROFILE WITH LIPIDS
Cholesterol, Total: 135 mg/dL (ref <200)
HDL Particle Number: 33.1 umol/L (ref >=30.5)
HDL-C: 44 mg/dL (ref >=40)
LDL (calc): 74 mg/dL (ref <100)
LP-IR Score: 47 — ABNORMAL HIGH (ref <=45)
Small LDL Particle Number: 632 nmol/L — ABNORMAL HIGH (ref <=527)
Triglycerides: 85 mg/dL (ref <150)

## 2012-09-11 ENCOUNTER — Encounter: Payer: Self-pay | Admitting: *Deleted

## 2012-10-17 ENCOUNTER — Telehealth: Payer: Self-pay | Admitting: Cardiology

## 2012-10-17 NOTE — Telephone Encounter (Signed)
I called the pt and made him aware that I am not sure who is trying to reach him from our office.  I reviewed his chart and noticed that he is due for follow-up with Dr Antoine Poche. I did schedule him for an appointment.

## 2012-10-17 NOTE — Telephone Encounter (Signed)
New problem    Patient calling stating someone called him today returning call back to nurse

## 2012-11-26 ENCOUNTER — Ambulatory Visit (INDEPENDENT_AMBULATORY_CARE_PROVIDER_SITE_OTHER): Payer: Medicare Other | Admitting: Cardiology

## 2012-11-26 ENCOUNTER — Encounter: Payer: Self-pay | Admitting: Cardiology

## 2012-11-26 VITALS — BP 134/74 | HR 60 | Ht 71.0 in | Wt 225.0 lb

## 2012-11-26 DIAGNOSIS — I2510 Atherosclerotic heart disease of native coronary artery without angina pectoris: Secondary | ICD-10-CM

## 2012-11-26 NOTE — Progress Notes (Signed)
HPI The patient presents with a one year followup. Since I last saw him he has had no new cardiovascular complaints. He remains somewhat active doing some walking and lots of yardwork working on many different yards.. With this he denies any chest pressure, neck or arm discomfort. He has no palpitations, presyncope or syncope. He has no shortness of breath, weight gain or edema. He has had swelling cough or fever.   No Known Allergies  Current Outpatient Prescriptions  Medication Sig Dispense Refill  . aspirin EC 81 MG tablet Take 81 mg by mouth every morning.        Marland Kitchen atorvastatin (LIPITOR) 20 MG tablet Take 1 tablet (20 mg total) by mouth daily.  30 tablet  5  . Cholecalciferol (VITAMIN D) 2000 UNITS CAPS Take 1 capsule by mouth daily.        . clopidogrel (PLAVIX) 75 MG tablet TAKE 1 TABLET (75 MG TOTAL) BY MOUTH DAILY.  30 tablet  1  . clopidogrel (PLAVIX) 75 MG tablet Take 1 tablet (75 mg total) by mouth every morning.  30 tablet  5  . escitalopram (LEXAPRO) 10 MG tablet Take 1 tablet (10 mg total) by mouth daily.  30 tablet  5  . LORazepam (ATIVAN) 0.5 MG tablet Take 1 tablet (0.5 mg total) by mouth every 8 (eight) hours.  30 tablet  2  . Multiple Vitamins-Minerals (MULTIVITAMIN WITH MINERALS) tablet Take 1 tablet by mouth daily.        . nitroGLYCERIN (NITROSTAT) 0.4 MG SL tablet Place 0.4 mg under the tongue every 5 (five) minutes as needed.         No current facility-administered medications for this visit.    Past Medical History  Diagnosis Date  . Coronary artery disease     s/p Taxus stenting to the LAD.  This was 2005. Catheterization in 2008, demonstrated a proximal calcification with ostial 40-50% stenosis, it was a widely patent stent, the circumflex had 40%  proximal stenosis,  the right coronary artery is large and dominant with minor nonobstructive plaque, the EF was 55%.  . Degenerative joint disease   . Depression with anxiety   . Dyslipidemia   . Sinus  bradycardia   . Carotid stenosis     Past Surgical History  Procedure Laterality Date  . Orthopedic surgery      right foot and right hip secondary to DJD    ROS: As stated in the HPI and negative for all other systems.  PHYSICAL EXAM BP 134/74  Pulse 60  Ht 5\' 11"  (1.803 m)  Wt 225 lb (102.059 kg)  BMI 31.39 kg/m2 GENERAL:  Well appearing NECK:  No jugular venous distention, waveform within normal limits, carotid upstroke brisk and symmetric, no bruits, no thyromegaly LYMPHATICS:  No cervical, inguinal adenopathy LUNGS:  Clear to auscultation bilaterally BACK:  No CVA tenderness CHEST:  Unremarkable HEART:  PMI not displaced or sustained,S1 and S2 within normal limits, no S3, no S4, no clicks, no rubs, no murmurs ABD:  Flat, positive bowel sounds normal in frequency in pitch, no bruits, no rebound, no guarding, no midline pulsatile mass, no hepatomegaly, no splenomegaly EXT:  2 plus pulses throughout, no edema, no cyanosis no clubbing   EKG:  Sinus rhythm, rate 60, axis within normal limits, intervals within normal limits, no acute ST-T wave changes.  11/26/2012   ASSESSMENT AND PLAN  CAD:  The patient has no new sypmtoms since stress testing in 2012.  No further  cardiovascular testing is indicated.  We will continue with aggressive risk reduction and meds as listed.  CAROTID STENOSIS:  This is mild and he will have this done again in 2016  DYSLIPIDEMIA:  This is followed by Dr. Christell Constant.  No change in therapy is indicated.   OVERWEIGHT:  I mentioned weight loss as a goal.  We discussed portion size.

## 2012-11-26 NOTE — Patient Instructions (Signed)
The current medical regimen is effective;  continue present plan and medications.  Follow up in 1 year with Dr Hochrein.  You will receive a letter in the mail 2 months before you are due.  Please call us when you receive this letter to schedule your follow up appointment.  

## 2012-12-10 ENCOUNTER — Ambulatory Visit (INDEPENDENT_AMBULATORY_CARE_PROVIDER_SITE_OTHER): Payer: Medicare Other

## 2012-12-10 DIAGNOSIS — Z23 Encounter for immunization: Secondary | ICD-10-CM

## 2013-01-13 ENCOUNTER — Encounter: Payer: Self-pay | Admitting: Nurse Practitioner

## 2013-01-13 ENCOUNTER — Ambulatory Visit (INDEPENDENT_AMBULATORY_CARE_PROVIDER_SITE_OTHER): Payer: Medicare Other | Admitting: Nurse Practitioner

## 2013-01-13 ENCOUNTER — Encounter (INDEPENDENT_AMBULATORY_CARE_PROVIDER_SITE_OTHER): Payer: Self-pay

## 2013-01-13 VITALS — BP 139/69 | HR 58 | Temp 98.1°F | Ht 71.0 in | Wt 224.0 lb

## 2013-01-13 DIAGNOSIS — E785 Hyperlipidemia, unspecified: Secondary | ICD-10-CM

## 2013-01-13 DIAGNOSIS — Z125 Encounter for screening for malignant neoplasm of prostate: Secondary | ICD-10-CM

## 2013-01-13 DIAGNOSIS — I2510 Atherosclerotic heart disease of native coronary artery without angina pectoris: Secondary | ICD-10-CM

## 2013-01-13 DIAGNOSIS — F3289 Other specified depressive episodes: Secondary | ICD-10-CM

## 2013-01-13 DIAGNOSIS — F329 Major depressive disorder, single episode, unspecified: Secondary | ICD-10-CM

## 2013-01-13 NOTE — Progress Notes (Signed)
  Subjective:    Patient ID: BIRDIE BEVERIDGE, male    DOB: 12-Jul-1945, 67 y.o.   MRN: [id number]  Hyperlipidemia This is a chronic problem. The current episode started more than 1 year ago. The problem is uncontrolled. Recent lipid tests were reviewed and are high. There are no known factors aggravating his hyperlipidemia. Pertinent negatives include no chest pain or shortness of breath. Current antihyperlipidemic treatment includes statins. The current treatment provides moderate improvement of lipids. Compliance problems include adherence to diet and adherence to exercise.  Risk factors for coronary artery disease include male sex.  CAD- Carotid artery stenosis Plavix daily- hasn't needed to use his nitro in awhile- sees cardiologist every 6 months. Cardiologist recently done carotid dopler studies which show mild occlusion. Depression/GAD Patient currently on lexapro and ativan- doing well on combination- said that meds keep him calm and from being anxious. Vitamin D deficiency Vitamin d OTC daily- no side effects  Review of Systems  Constitutional: Negative for fatigue and unexpected weight change.  HENT: Negative.   Respiratory: Negative for cough, chest tightness and shortness of breath.   Cardiovascular: Negative for chest pain, palpitations and leg swelling.  Gastrointestinal: Negative for diarrhea and constipation.  Endocrine: Negative.   Genitourinary: Negative.   Neurological: Negative.   Psychiatric/Behavioral: Negative.        Objective:   Physical Exam  Constitutional: He is oriented to person, place, and time. He appears well-developed and well-nourished.  HENT:  Head: Normocephalic.  Right Ear: External ear normal.  Left Ear: External ear normal.  Nose: Nose normal.  Mouth/Throat: Oropharynx is clear and moist.  Eyes: EOM are normal. Pupils are equal, round, and reactive to light.  Neck: Normal range of motion. Neck supple. No thyromegaly present.  Cardiovascular:  Normal rate, regular rhythm, normal heart sounds and intact distal pulses.   No murmur heard. Faint bil carotid bruits  Pulmonary/Chest: Effort normal and breath sounds normal. He has no wheezes. He has no rales.  Abdominal: Soft. Bowel sounds are normal.  Genitourinary: Prostate normal and penis normal. Guaiac negative stool.  Musculoskeletal: Normal range of motion.  Neurological: He is alert and oriented to person, place, and time.  Skin: Skin is warm and dry.  Psychiatric: He has a normal mood and affect. His behavior is normal. Judgment and thought content normal.    BP 139/69  Pulse 58  Temp(Src) 98.1 F (36.7 C) (Oral)  Ht 5\' 11"  (1.803 m)  Wt 224 lb (101.606 kg)  BMI 31.26 kg/m2       Assessment & Plan:   1. DYSLIPIDEMIA   2. DEPRESSION   3. CAD   4. Prostate cancer screening    Orders Placed This Encounter  Procedures  . CMP14+EGFR  . NMR, lipoprofile  . PSA, total and free     Continue all meds Labs pending Diet and exercise encouraged Health maintenance reviewed Follow up in 3 months  Mary-Margaret Daphine Deutscher, FNP

## 2013-01-13 NOTE — Patient Instructions (Signed)

## 2013-01-15 LAB — CMP14+EGFR
ALT: 22 IU/L (ref 0–44)
Albumin: 4.3 g/dL (ref 3.6–4.8)
BUN: 14 mg/dL (ref 8–27)
CO2: 27 mmol/L (ref 18–29)
Calcium: 10 mg/dL (ref 8.6–10.2)
Chloride: 105 mmol/L (ref 97–108)
GFR calc non Af Amer: 88 mL/min/{1.73_m2} (ref >59)
Glucose: 111 mg/dL — ABNORMAL HIGH (ref 65–99)
Potassium: 4.9 mmol/L (ref 3.5–5.2)
Total Protein: 6.3 g/dL (ref 6.0–8.5)

## 2013-01-15 LAB — NMR, LIPOPROFILE
HDL Cholesterol by NMR: 45 mg/dL (ref >=40)
LDL Particle Number: 1027 nmol/L — ABNORMAL HIGH (ref <1000)
Triglycerides by NMR: 118 mg/dL (ref <150)

## 2013-01-15 LAB — PSA, TOTAL AND FREE
PSA, Free Pct: 43.3 %
PSA, Free: 0.13 ng/mL

## 2013-01-29 ENCOUNTER — Encounter: Payer: Self-pay | Admitting: Family Medicine

## 2013-01-29 ENCOUNTER — Ambulatory Visit (INDEPENDENT_AMBULATORY_CARE_PROVIDER_SITE_OTHER): Payer: Medicare Other | Admitting: Family Medicine

## 2013-01-29 ENCOUNTER — Encounter (INDEPENDENT_AMBULATORY_CARE_PROVIDER_SITE_OTHER): Payer: Self-pay

## 2013-01-29 VITALS — BP 130/75 | HR 62 | Temp 99.1°F | Ht 71.0 in | Wt 220.0 lb

## 2013-01-29 DIAGNOSIS — J449 Chronic obstructive pulmonary disease, unspecified: Secondary | ICD-10-CM

## 2013-01-29 DIAGNOSIS — J069 Acute upper respiratory infection, unspecified: Secondary | ICD-10-CM

## 2013-01-29 MED ORDER — DOXYCYCLINE HYCLATE 100 MG PO CAPS: 100.0000 mg | ORAL_CAPSULE | Freq: Two times a day (BID) | ORAL | Status: DC

## 2013-01-29 MED ORDER — ALBUTEROL SULFATE HFA 108 (90 BASE) MCG/ACT IN AERS: 2.0000 | INHALATION_SPRAY | RESPIRATORY_TRACT | Status: DC | PRN

## 2013-01-29 MED ORDER — PREDNISONE 50 MG PO TABS: ORAL_TABLET | ORAL | Status: DC

## 2013-01-29 MED ORDER — SODIUM CHLORIDE 0.9 % IV SOLN: 125.0000 mg | Freq: Once | INTRAVENOUS | Status: DC

## 2013-01-29 NOTE — Progress Notes (Signed)
   Subjective:    Patient ID: NIKITAS DAVTYAN, male    DOB: [birth year], 67 y.o.   MRN: [id number]  HPI URI Symptoms Onset: 3 weeks  Description: rhinorrhea, nasal congestion, cough, wheezing  Modifying factors:  Prior 30 pack year smoking history   Symptoms Nasal discharge: yes Fever: no Sore throat: no Cough: yes Wheezing: yes Ear pain: no GI symptoms: no Sick contacts: no  Red Flags  Stiff neck: no Dyspnea: mild. Central chest tightness  Rash: no Swallowing difficulty: no  Sinusitis Risk Factors Headache/face pain: no Double sickening: no tooth pain: no  Allergy Risk Factors Sneezing: no Itchy scratchy throat: no Seasonal symptoms: no  Flu Risk Factors Headache: no muscle aches: no severe fatigue: no     Review of Systems  All other systems reviewed and are negative.       Objective:   Physical Exam  Constitutional: He appears well-developed and well-nourished.  HENT:  Head: Normocephalic and atraumatic.  Right Ear: External ear normal.  Left Ear: External ear normal.  +nasal erythema, rhinorrhea bilaterally, + post oropharyngeal erythema    Eyes: Conjunctivae are normal. Pupils are equal, round, and reactive to light.  Neck: Normal range of motion.  Cardiovascular: Normal rate and regular rhythm.   Pulmonary/Chest: Effort normal. He has wheezes.  Abdominal: Soft.  Neurological: He is alert.  Skin: Skin is warm.          Assessment & Plan:  URI (upper respiratory infection) - Plan: doxycycline (VIBRAMYCIN) 100 MG capsule  Obstructive lung disease - Plan: methylPREDNISolone sodium succinate (SOLU-MEDROL) 130 mg in sodium chloride 0.9 % 50 mL IVPB, predniSONE (DELTASONE) 50 MG tablet, doxycycline (VIBRAMYCIN) 100 MG capsule, albuterol (PROVENTIL HFA;VENTOLIN HFA) 108 (90 BASE) MCG/ACT inhaler  Likely viral induced obstructive lung disease exacerbation.  Solumedrol 125mg  IM x1 Duoneb x1  Subsequent improvement in aeration s/p  duoneb Albuterol inhaler  Prednisone and doxy  ? Underlying COPD/asthma-Will need formal PFTs 1-2 months pending resolution of URI.  Discussed infectious and resp red flags follow up as needed.

## 2013-01-30 MED ORDER — METHYLPREDNISOLONE SODIUM SUCC 125 MG IJ SOLR
125.0000 mg | Freq: Once | INTRAMUSCULAR | Status: AC
  Administered 2013-01-29: 125 mg via INTRAMUSCULAR

## 2013-01-30 MED ORDER — IPRATROPIUM-ALBUTEROL 0.5-2.5 (3) MG/3ML IN SOLN
3.0000 mL | Freq: Once | RESPIRATORY_TRACT | Status: AC
  Administered 2013-01-29: 3 mL via RESPIRATORY_TRACT

## 2013-01-30 NOTE — Addendum Note (Signed)
Addended by: Gwenith Daily on: 01/30/2013 02:04 PM   Modules accepted: Orders

## 2013-02-02 ENCOUNTER — Other Ambulatory Visit: Payer: Self-pay | Admitting: Nurse Practitioner

## 2013-02-10 ENCOUNTER — Telehealth: Payer: Self-pay | Admitting: Family Medicine

## 2013-02-10 NOTE — Telephone Encounter (Signed)
Message copied by Azalee Course on Mon Feb 10, 2013  2:42 PM ------      Message from: Bennie Pierini      Created: Wed Jan 15, 2013  2:06 PM       All labs look normal ------

## 2013-02-11 ENCOUNTER — Encounter: Payer: Self-pay | Admitting: Family Medicine

## 2013-02-11 ENCOUNTER — Ambulatory Visit (INDEPENDENT_AMBULATORY_CARE_PROVIDER_SITE_OTHER): Payer: Medicare Other | Admitting: Family Medicine

## 2013-02-11 ENCOUNTER — Ambulatory Visit (INDEPENDENT_AMBULATORY_CARE_PROVIDER_SITE_OTHER): Payer: Medicare Other

## 2013-02-11 VITALS — BP 125/76 | HR 59 | Temp 97.1°F | Ht 71.0 in | Wt 224.0 lb

## 2013-02-11 DIAGNOSIS — J209 Acute bronchitis, unspecified: Secondary | ICD-10-CM

## 2013-02-11 DIAGNOSIS — R05 Cough: Secondary | ICD-10-CM

## 2013-02-11 MED ORDER — PREDNISONE 10 MG PO TABS: ORAL_TABLET | ORAL | Status: DC

## 2013-02-11 MED ORDER — AMOXICILLIN 875 MG PO TABS: 875.0000 mg | ORAL_TABLET | Freq: Two times a day (BID) | ORAL | Status: DC

## 2013-02-11 NOTE — Progress Notes (Signed)
   Subjective:    Patient ID: [name], male    DOB: Jan 11, 1946, 67 y.o.   MRN: [id number]  HPI This 67 y.o. male presents for evaluation of cough, SOB, and URI sx's.  He was seen a week ago And was tx'd with abx's, pred taper, and SABA MDI inhaler.  He has hx of long time smoking. He has been doing fine but the last few days he has become sick again and is having SOB and coughing and has right rib pain.   Review of Systems C/o cough, sob, and congestion No chest pain,  HA, dizziness, vision change, N/V, diarrhea, constipation, dysuria, urinary urgency or frequency, myalgias, arthralgias or rash.     Objective:   Physical Exam Vital signs noted  Well developed well nourished male.  HEENT - Head atraumatic Normocephalic                Eyes - PERRLA, Conjuctiva - clear Sclera- Clear EOMI                Ears - EAC's Wnl TM's Wnl Gross Hearing WNL                Throat - oropharanx wnl Respiratory - Lungs scattered expiratory wheezes bilateral Cardiac - RRR S1 and S2 without murmur GI - Abdomen soft Nontender and bowel sounds active x 4 Extremities - No edema. Neuro - Grossly intact. Vital signs noted  CXR- No infiltrates or fractures      Assessment & Plan:  Cough - Plan: DG Ribs Unilateral W/Chest Right, predniSONE (DELTASONE) 10 MG tablet, amoxicillin (AMOXIL) 875 MG tablet  Acute bronchitis - Plan: predniSONE (DELTASONE) 10 MG tablet, amoxicillin (AMOXIL) 875 MG tablet  Push po fluids, rest, tylenol and motrin otc prn as directed for fever, arthralgias, and myalgias.  Follow up prn if sx's continue or persist.  Deatra Canter FNP

## 2013-02-11 NOTE — Telephone Encounter (Signed)
appt today at 4:30 

## 2013-02-11 NOTE — Telephone Encounter (Signed)
Pt aware of normal lab results.  He wants to be seen for rib pain-right side ??from coughing.  Call him at [phone]

## 2013-02-21 ENCOUNTER — Other Ambulatory Visit: Payer: Self-pay | Admitting: Nurse Practitioner

## 2013-05-19 ENCOUNTER — Other Ambulatory Visit: Payer: Self-pay | Admitting: *Deleted

## 2013-05-19 MED ORDER — ESCITALOPRAM OXALATE 10 MG PO TABS: ORAL_TABLET | ORAL | Status: DC

## 2013-05-19 NOTE — Telephone Encounter (Signed)
Last ov 12/14. 

## 2013-05-20 ENCOUNTER — Other Ambulatory Visit: Payer: Self-pay | Admitting: Family Medicine

## 2013-05-21 NOTE — Telephone Encounter (Signed)
Patient NTBS for follow up and lab work  

## 2013-05-21 NOTE — Telephone Encounter (Signed)
Last ov 12/15. 

## 2013-06-24 ENCOUNTER — Other Ambulatory Visit: Payer: Self-pay

## 2013-06-24 DIAGNOSIS — F411 Generalized anxiety disorder: Secondary | ICD-10-CM

## 2013-06-24 NOTE — Telephone Encounter (Signed)
Last seen 02/11/13  B [place]  If approved route to nurse to call into [facility]

## 2013-07-14 ENCOUNTER — Other Ambulatory Visit: Payer: Self-pay | Admitting: Family Medicine

## 2013-08-12 ENCOUNTER — Other Ambulatory Visit: Payer: Self-pay | Admitting: Nurse Practitioner

## 2013-08-14 ENCOUNTER — Other Ambulatory Visit: Payer: Self-pay | Admitting: Family Medicine

## 2013-08-14 ENCOUNTER — Other Ambulatory Visit: Payer: Self-pay | Admitting: Nurse Practitioner

## 2013-08-18 NOTE — Telephone Encounter (Signed)
Last lipid B [place] 01/13/13

## 2013-08-18 NOTE — Telephone Encounter (Signed)
Last seen 02/11/13  B [place]  If approved route to  Nurse to call into [facility]

## 2013-08-19 ENCOUNTER — Other Ambulatory Visit: Payer: Self-pay | Admitting: *Deleted

## 2013-08-19 DIAGNOSIS — F411 Generalized anxiety disorder: Secondary | ICD-10-CM

## 2013-08-19 NOTE — Telephone Encounter (Signed)
Last refill 08/23/12. If approved route to pool to call to [facility]. Last ov 12/14.

## 2013-08-19 NOTE — Telephone Encounter (Signed)
NTBS for ativan refill

## 2013-08-20 NOTE — Telephone Encounter (Signed)
Called in.

## 2013-10-03 ENCOUNTER — Other Ambulatory Visit: Payer: Self-pay | Admitting: Family Medicine

## 2013-10-06 NOTE — Telephone Encounter (Signed)
Last lipids 12/14.

## 2013-10-06 NOTE — Telephone Encounter (Signed)
No refill on lipitor until comes in for follow up

## 2013-10-27 ENCOUNTER — Encounter: Payer: Self-pay | Admitting: Nurse Practitioner

## 2013-10-27 ENCOUNTER — Ambulatory Visit (INDEPENDENT_AMBULATORY_CARE_PROVIDER_SITE_OTHER): Payer: Medicare Other | Admitting: Nurse Practitioner

## 2013-10-27 ENCOUNTER — Ambulatory Visit (INDEPENDENT_AMBULATORY_CARE_PROVIDER_SITE_OTHER): Payer: Medicare Other

## 2013-10-27 VITALS — BP 128/70 | HR 66 | Temp 97.4°F | Ht 71.0 in | Wt 222.2 lb

## 2013-10-27 DIAGNOSIS — Z87891 Personal history of nicotine dependence: Secondary | ICD-10-CM

## 2013-10-27 DIAGNOSIS — F329 Major depressive disorder, single episode, unspecified: Secondary | ICD-10-CM

## 2013-10-27 DIAGNOSIS — I6529 Occlusion and stenosis of unspecified carotid artery: Secondary | ICD-10-CM

## 2013-10-27 DIAGNOSIS — Z23 Encounter for immunization: Secondary | ICD-10-CM

## 2013-10-27 DIAGNOSIS — I2510 Atherosclerotic heart disease of native coronary artery without angina pectoris: Secondary | ICD-10-CM

## 2013-10-27 DIAGNOSIS — E785 Hyperlipidemia, unspecified: Secondary | ICD-10-CM

## 2013-10-27 DIAGNOSIS — Z713 Dietary counseling and surveillance: Secondary | ICD-10-CM

## 2013-10-27 DIAGNOSIS — E663 Overweight: Secondary | ICD-10-CM

## 2013-10-27 DIAGNOSIS — F3289 Other specified depressive episodes: Secondary | ICD-10-CM

## 2013-10-27 DIAGNOSIS — R635 Abnormal weight gain: Secondary | ICD-10-CM

## 2013-10-27 DIAGNOSIS — Z6830 Body mass index (BMI) 30.0-30.9, adult: Secondary | ICD-10-CM

## 2013-10-27 MED ORDER — ATORVASTATIN CALCIUM 20 MG PO TABS: ORAL_TABLET | ORAL | Status: DC

## 2013-10-27 MED ORDER — CLOPIDOGREL BISULFATE 75 MG PO TABS: ORAL_TABLET | ORAL | Status: DC

## 2013-10-27 MED ORDER — ESCITALOPRAM OXALATE 10 MG PO TABS: ORAL_TABLET | ORAL | Status: DC

## 2013-10-27 NOTE — Patient Instructions (Signed)

## 2013-10-27 NOTE — Progress Notes (Signed)
Subjective:    Patient ID: [name], male    DOB: 11-Dec-1945, 68 y.o.   MRN: [id number]  Patient here today for follow up of chronic medical problems.  Hyperlipidemia This is a chronic problem. The current episode started more than 1 year ago. The problem is uncontrolled. Recent lipid tests were reviewed and are high. There are no known factors aggravating his hyperlipidemia. Pertinent negatives include no chest pain or shortness of breath. Current antihyperlipidemic treatment includes statins. The current treatment provides moderate improvement of lipids. Compliance problems include adherence to diet and adherence to exercise.  Risk factors for coronary artery disease include male sex.  CAD- Carotid artery stenosis Plavix daily- hasn't needed to use his nitro in awhile- sees cardiologist every 6 months. Cardiologist appointmnet in 1 month for recheck. Depression/GAD Patient currently on lexapro and ativan- doing well on combination- said that meds keep him calm and from being anxious. Vitamin D deficiency Vitamin d OTC daily- no side effects  Review of Systems  Constitutional: Negative for fatigue and unexpected weight change.  HENT: Negative.   Respiratory: Negative for cough, chest tightness and shortness of breath.   Cardiovascular: Negative for chest pain, palpitations and leg swelling.  Gastrointestinal: Negative for diarrhea and constipation.  Endocrine: Negative.   Genitourinary: Negative.   Neurological: Negative.   Psychiatric/Behavioral: Negative.        Objective:   Physical Exam  Constitutional: He is oriented to person, place, and time. He appears well-developed and well-nourished.  HENT:  Head: Normocephalic.  Right Ear: External ear normal.  Left Ear: External ear normal.  Nose: Nose normal.  Mouth/Throat: Oropharynx is clear and moist.  Eyes: EOM are normal. Pupils are equal, round, and reactive to light.  Neck: Normal range of motion. Neck supple. No  thyromegaly present.  Cardiovascular: Normal rate, regular rhythm, normal heart sounds and intact distal pulses.   No murmur heard. Faint bil carotid bruits  Pulmonary/Chest: Effort normal and breath sounds normal. He has no wheezes. He has no rales.  Abdominal: Soft. Bowel sounds are normal.  Genitourinary: Prostate normal and penis normal. Guaiac negative stool.  Musculoskeletal: Normal range of motion.  Neurological: He is alert and oriented to person, place, and time.  Skin: Skin is warm and dry.  Psychiatric: He has a normal mood and affect. His behavior is normal. Judgment and thought content normal.    BP 128/70  Pulse 66  Temp(Src) 97.4 F (36.3 C) (Oral)  Ht 5' 11"  (1.803 m)  Wt 222 lb 3.2 oz (100.789 kg)  BMI 31.00 kg/m2       Assessment & Plan:   1. OVERWEIGHT   2. DYSLIPIDEMIA   3. DEPRESSION   4. CAROTID ARTERY STENOSIS   5. CAD   6. Smoking hx   7. Hyperlipidemia with target LDL less than 100   8. BMI 30.0-30.9,adult   9. Weight loss counseling, encounter for     Orders Placed This Encounter  Procedures  . DG Chest 2 View    Standing Status: Future     Number of Occurrences:      Standing Expiration Date: 12/27/2014    Order Specific Question:  Reason for Exam (SYMPTOM  OR DIAGNOSIS REQUIRED)    Answer:  hx smoking    Order Specific Question:  Preferred imaging location?    Answer:  Internal  . CMP14+EGFR  . NMR, lipoprofile   Meds ordered this encounter  Medications  . escitalopram (LEXAPRO) 10 MG tablet  Sig: TAKE 1 TABLET (10 MG TOTAL) BY MOUTH DAILY.    Dispense:  30 tablet    Refill:  5    Order Specific Question:  Supervising Provider    Answer:  Chipper Herb [[id number]]  . clopidogrel (PLAVIX) 75 MG tablet    Sig: TAKE 1 TABLET (75 MG TOTAL) BY MOUTH DAILY.    Dispense:  30 tablet    Refill:  5    Order Specific Question:  Supervising Provider    Answer:  Chipper Herb [[id number]]  . atorvastatin (LIPITOR) 20 MG tablet    Sig: TAKE 1  TABLET (20 MG TOTAL) BY MOUTH DAILY.    Dispense:  30 tablet    Refill:  5    Order Specific Question:  Supervising Provider    Answer:  Joycelyn Man   prevnar today Discussed weight management for patient with BMI> 25 Labs pending Health maintenance reviewed Diet and exercise encouraged Continue all meds Follow up  In 6 months  [place], FNP

## 2013-10-28 LAB — CMP14+EGFR
ALBUMIN: 4.6 g/dL (ref 3.6–4.8)
ALK PHOS: 69 IU/L (ref 39–117)
ALT: 22 IU/L (ref 0–44)
AST: 25 IU/L (ref 0–40)
Albumin/Globulin Ratio: 2 (ref 1.1–2.5)
BILIRUBIN TOTAL: 0.5 mg/dL (ref 0.0–1.2)
BUN / CREAT RATIO: 23 — AB (ref 10–22)
BUN: 18 mg/dL (ref 8–27)
CO2: 23 mmol/L (ref 18–29)
Calcium: 9.6 mg/dL (ref 8.6–10.2)
Chloride: 102 mmol/L (ref 97–108)
Creatinine, Ser: 0.78 mg/dL (ref 0.76–1.27)
GFR, EST AFRICAN AMERICAN: 107 mL/min/{1.73_m2} (ref >59)
GFR, EST NON AFRICAN AMERICAN: 93 mL/min/{1.73_m2} (ref >59)
Globulin, Total: 2.3 g/dL (ref 1.5–4.5)
Glucose: 91 mg/dL (ref 65–99)
POTASSIUM: 4.3 mmol/L (ref 3.5–5.2)
Sodium: 142 mmol/L (ref 134–144)
Total Protein: 6.9 g/dL (ref 6.0–8.5)

## 2013-10-28 LAB — NMR, LIPOPROFILE
Cholesterol: 161 mg/dL (ref 100–199)
HDL Cholesterol by NMR: 47 mg/dL (ref >39)
HDL Particle Number: 34.2 umol/L (ref >=30.5)
LDL Particle Number: 1241 nmol/L — ABNORMAL HIGH (ref <1000)
LDL SIZE: 20.2 nm (ref >20.5)
LDLC SERPL CALC-MCNC: 95 mg/dL (ref 0–99)
LP-IR Score: 55 — ABNORMAL HIGH (ref <=45)
Small LDL Particle Number: 782 nmol/L — ABNORMAL HIGH (ref <=527)
TRIGLYCERIDES BY NMR: 94 mg/dL (ref 0–149)

## 2013-10-29 ENCOUNTER — Telehealth: Payer: Self-pay | Admitting: Nurse Practitioner

## 2013-10-29 NOTE — Telephone Encounter (Signed)
Message copied by Cline Crock on Wed Oct 29, 2013  4:23 PM ------      Message from: Chevis Pretty      Created: Wed Oct 29, 2013  4:06 PM       Kidney and liver function stable      Cholesterol looks great      Continue current meds- low fat diet and exercise and recheck in 3 months       ------

## 2013-11-03 ENCOUNTER — Encounter: Payer: Self-pay | Admitting: *Deleted

## 2013-11-06 NOTE — Telephone Encounter (Signed)
Patient aware.

## 2013-11-19 ENCOUNTER — Ambulatory Visit (INDEPENDENT_AMBULATORY_CARE_PROVIDER_SITE_OTHER): Payer: Medicare Other

## 2013-11-19 DIAGNOSIS — Z23 Encounter for immunization: Secondary | ICD-10-CM

## 2013-11-21 ENCOUNTER — Ambulatory Visit (INDEPENDENT_AMBULATORY_CARE_PROVIDER_SITE_OTHER): Payer: Medicare Other | Admitting: Cardiology

## 2013-11-21 ENCOUNTER — Encounter: Payer: Self-pay | Admitting: Cardiology

## 2013-11-21 VITALS — BP 141/81 | HR 55 | Ht 71.0 in | Wt 223.0 lb

## 2013-11-21 DIAGNOSIS — I2510 Atherosclerotic heart disease of native coronary artery without angina pectoris: Secondary | ICD-10-CM

## 2013-11-21 NOTE — Patient Instructions (Signed)
Stop taking your plavix  We are ordering a treadmill test   Your physician recommends that you schedule a follow-up appointment in: one year

## 2013-11-21 NOTE — Progress Notes (Signed)
HPI The patient presents with a one year followup. Since I last saw him he has had no new cardiovascular complaints. He remains somewhat active doing some walking and lots of yardwork working on many different yards. He says he exercises at least 150 minutes a week moderately. With this he denies any chest pressure, neck or arm discomfort. He has no palpitations, presyncope or syncope. He has no shortness of breath, weight gain or edema. He has had swelling no cough or fever.   No Known Allergies  Current Outpatient Prescriptions  Medication Sig Dispense Refill  . albuterol (PROVENTIL HFA;VENTOLIN HFA) 108 (90 BASE) MCG/ACT inhaler Inhale 2 puffs into the lungs every 4 (four) hours as needed for wheezing or shortness of breath (cough, shortness of breath or wheezing.).  1 Inhaler  1  . aspirin EC 81 MG tablet Take 81 mg by mouth every morning.        Marland Kitchen atorvastatin (LIPITOR) 20 MG tablet TAKE 1 TABLET (20 MG TOTAL) BY MOUTH DAILY.  30 tablet  5  . Cholecalciferol (VITAMIN D) 2000 UNITS CAPS Take 1 capsule by mouth daily.        . clopidogrel (PLAVIX) 75 MG tablet TAKE 1 TABLET (75 MG TOTAL) BY MOUTH EVERY MORNING.  30 tablet  2  . clopidogrel (PLAVIX) 75 MG tablet TAKE 1 TABLET (75 MG TOTAL) BY MOUTH DAILY.  30 tablet  5  . escitalopram (LEXAPRO) 10 MG tablet TAKE 1 TABLET (10 MG TOTAL) BY MOUTH DAILY.  30 tablet  5  . LORazepam (ATIVAN) 0.5 MG tablet Take 1 tablet (0.5 mg total) by mouth every 8 (eight) hours.  30 tablet  2  . Multiple Vitamins-Minerals (MULTIVITAMIN WITH MINERALS) tablet Take 1 tablet by mouth daily.        . nitroGLYCERIN (NITROSTAT) 0.4 MG SL tablet Place 0.4 mg under the tongue every 5 (five) minutes as needed.         No current facility-administered medications for this visit.    Past Medical History  Diagnosis Date  . Coronary artery disease     s/p Taxus stenting to the LAD.  This was 2005. Catheterization in 2008, demonstrated a proximal calcification with  ostial 40-50% stenosis, it was a widely patent stent, the circumflex had 40%  proximal stenosis,  the right coronary artery is large and dominant with minor nonobstructive plaque, the EF was 55%.  . Degenerative joint disease   . Depression with anxiety   . Dyslipidemia   . Sinus bradycardia   . Carotid stenosis     Past Surgical History  Procedure Laterality Date  . Orthopedic surgery      right foot and right hip secondary to DJD    ROS: As stated in the HPI and negative for all other systems.  PHYSICAL EXAM BP 141/81  Pulse 61  Ht 5\' 11"  (1.803 m)  Wt 223 lb (101.152 kg)  BMI 31.12 kg/m2 GENERAL:  Well appearing NECK:  No jugular venous distention, waveform within normal limits, carotid upstroke brisk and symmetric, no bruits, no thyromegaly LYMPHATICS:  No cervical, inguinal adenopathy LUNGS:  Clear to auscultation bilaterally BACK:  No CVA tenderness CHEST:  Unremarkable HEART:  PMI not displaced or sustained,S1 and S2 within normal limits, no S3, no S4, no clicks, no rubs, no murmurs ABD:  Flat, positive bowel sounds normal in frequency in pitch, no bruits, no rebound, no guarding, no midline pulsatile mass, no hepatomegaly, no splenomegaly EXT:  2 plus pulses  throughout, no edema, no cyanosis no clubbing   EKG:  Sinus rhythm, rate 55, axis within normal limits, intervals within normal limits, no acute ST-T wave changes.  11/21/2013   ASSESSMENT AND PLAN  CAD:  The patient has no new sypmtoms since stress testing in 2012.  I will bring the patient back for a POET (Plain Old Exercise Test). This will allow me to screen for obstructive coronary disease, risk stratify and very importantly provide a prescription for exercise.  Of note we have weighed the risks and benefits at this point I think he can stop his Plavix and continue aspirin alone  CAROTID STENOSIS:  This is mild and he will have this done again in 2016  DYSLIPIDEMIA:  This is followed by Dr. Laurance Flatten.  No change  in therapy is indicated.  I reviewed and the LDL was 95 with HDL 34.2  OVERWEIGHT:  We reviewed this.

## 2013-12-09 ENCOUNTER — Telehealth (HOSPITAL_COMMUNITY): Payer: Self-pay

## 2013-12-09 NOTE — Telephone Encounter (Signed)
Encounter complete. 

## 2013-12-11 ENCOUNTER — Ambulatory Visit (HOSPITAL_COMMUNITY)
Admission: RE | Admit: 2013-12-11 | Discharge: 2013-12-11 | Disposition: A | Discharge status: Home / Self Care | Payer: Medicare Other | Source: Ambulatory Visit | Attending: Cardiology | Admitting: Cardiology

## 2013-12-11 DIAGNOSIS — R0602 Shortness of breath: Secondary | ICD-10-CM | POA: Diagnosis present

## 2013-12-11 DIAGNOSIS — I2510 Atherosclerotic heart disease of native coronary artery without angina pectoris: Secondary | ICD-10-CM

## 2013-12-11 NOTE — Procedures (Signed)
Exercise Treadmill Test  Pre-Exercise Testing Evaluation NSR, normal ECG  Test  Exercise Tolerance Test Ordering MD: Marijo File, MD    Unique Test No: 1  Treadmill:  1  Indication for ETT: Stent patency  Contraindication to ETT: No   Stress Modality: exercise - treadmill  Cardiac Imaging Performed: non   Protocol: standard Bruce - maximal  Max BP:  197/68  Max MPHR (bpm):  152 85% MPR (bpm):  129  MPHR obtained (bpm):  133 % MPHR obtained:  87  Reached 85% MPHR (min:sec):  5:20 Total Exercise Time (min-sec):  5:30  Workload in METS:  7.0 Borg Scale: 15  Reason ETT Terminated:  Leg Fatigue and Marked SOB    ST Segment Analysis At Rest: normal ST segments - no evidence of significant ST depression With Exercise: no evidence of significant ST depression  Other Information Arrhythmia:  rare PVCs Angina during ETT:  absent (0) Quality of ETT:  diagnostic  ETT Interpretation:  normal - no evidence of ischemia by ST analysis  Comments: Fair exercise tolerance Normal BP response to exercise.  Sanda Klein, MD, [facility] [phone] office [phone] pager

## 2013-12-16 ENCOUNTER — Encounter: Payer: Self-pay | Admitting: Cardiology

## 2014-03-16 ENCOUNTER — Ambulatory Visit (INDEPENDENT_AMBULATORY_CARE_PROVIDER_SITE_OTHER): Payer: Medicare Other | Admitting: Family

## 2014-03-16 VITALS — BP 136/80 | HR 65 | Ht 71.0 in

## 2014-03-16 DIAGNOSIS — F411 Generalized anxiety disorder: Secondary | ICD-10-CM

## 2014-03-16 DIAGNOSIS — R079 Chest pain, unspecified: Secondary | ICD-10-CM

## 2014-03-16 NOTE — Patient Instructions (Signed)
Generalized Anxiety Disorder Generalized anxiety disorder (GAD) is a mental disorder. It interferes with life functions, including relationships, work, and school. GAD is different from normal anxiety, which everyone experiences at some point in their lives in response to specific life events and activities. Normal anxiety actually helps [place] prepare for and get through these life events and activities. Normal anxiety goes away after the event or activity is over.  GAD causes anxiety that is not necessarily related to specific events or activities. It also causes excess anxiety in proportion to specific events or activities. The anxiety associated with GAD is also difficult to control. GAD can vary from mild to severe. People with severe GAD can have intense waves of anxiety with physical symptoms (panic attacks).  SYMPTOMS The anxiety and worry associated with GAD are difficult to control. This anxiety and worry are related to many life events and activities and also occur more days than not for 6 months or longer. People with GAD also have three or more of the following symptoms (one or more in children):  Restlessness.   Fatigue.  Difficulty concentrating.   Irritability.  Muscle tension.  Difficulty sleeping or unsatisfying sleep. DIAGNOSIS GAD is diagnosed through an assessment by your health care provider. Your health care provider will ask you questions aboutyour mood,physical symptoms, and events in your life. Your health care provider may ask you about your medical history and use of alcohol or drugs, including prescription medicines. Your health care provider may also do a physical exam and blood tests. Certain medical conditions and the use of certain substances can cause symptoms similar to those associated with GAD. Your health care provider may refer you to a mental health specialist for further evaluation. TREATMENT The following therapies are usually used to treat GAD:    Medication. Antidepressant medication usually is prescribed for long-term daily control. Antianxiety medicines may be added in severe cases, especially when panic attacks occur.   Talk therapy (psychotherapy). Certain types of talk therapy can be helpful in treating GAD by providing support, education, and guidance. A form of talk therapy called cognitive behavioral therapy can teach you healthy ways to think about and react to daily life events and activities.  Stress managementtechniques. These include yoga, meditation, and exercise and can be very helpful when they are practiced regularly. A mental health specialist can help determine which treatment is best for you. Some people see improvement with one therapy. However, other people require a combination of therapies. Document Released: 05/27/2012 Document Revised: 06/16/2013 Document Reviewed: 05/27/2012 [facility] Patient Information 2015 [place], [place]. This information is not intended to replace advice given to you by your health care provider. Make sure you discuss any questions you have with your health care provider. Chest Pain (Nonspecific) It is often hard to give a specific diagnosis for the cause of chest pain. There is always a chance that your pain could be related to something serious, such as a heart attack or a blood clot in the lungs. You need to follow up with your health care provider for further evaluation. CAUSES   Heartburn.  Pneumonia or bronchitis.  Anxiety or stress.  Inflammation around your heart (pericarditis) or lung (pleuritis or pleurisy).  A blood clot in the lung.  A collapsed lung (pneumothorax). It can develop suddenly on its own (spontaneous pneumothorax) or from trauma to the chest.  Shingles infection (herpes zoster virus). The chest wall is composed of bones, muscles, and cartilage. Any of these can be the source  of the pain.  The bones can be bruised by injury.  The muscles or cartilage  can be strained by coughing or overwork.  The cartilage can be affected by inflammation and become sore (costochondritis). DIAGNOSIS  Lab tests or other studies may be needed to find the cause of your pain. Your health care provider may have you take a test called an ambulatory electrocardiogram (ECG). An ECG records your heartbeat patterns over a 24-hour period. You may also have other tests, such as:  Transthoracic echocardiogram (TTE). During echocardiography, sound waves are used to evaluate how blood flows through your heart.  Transesophageal echocardiogram (TEE).  Cardiac monitoring. This allows your health care provider to monitor your heart rate and rhythm in real time.  Holter monitor. This is a portable device that records your heartbeat and can help diagnose heart arrhythmias. It allows your health care provider to track your heart activity for several days, if needed.  Stress tests by exercise or by giving medicine that makes the heart beat faster. TREATMENT   Treatment depends on what may be causing your chest pain. Treatment may include:  Acid blockers for heartburn.  Anti-inflammatory medicine.  Pain medicine for inflammatory conditions.  Antibiotics if an infection is present.  You may be advised to change lifestyle habits. This includes stopping smoking and avoiding alcohol, caffeine, and chocolate.  You may be advised to keep your head raised (elevated) when sleeping. This reduces the chance of acid going backward from your stomach into your esophagus. Most of the time, nonspecific chest pain will improve within 2-3 days with rest and mild pain medicine.  HOME CARE INSTRUCTIONS   If antibiotics were prescribed, take them as directed. Finish them even if you start to feel better.  For the next few days, avoid physical activities that bring on chest pain. Continue physical activities as directed.  Do not use any tobacco products, including cigarettes, chewing  tobacco, or electronic cigarettes.  Avoid drinking alcohol.  Only take medicine as directed by your health care provider.  Follow your health care provider's suggestions for further testing if your chest pain does not go away.  Keep any follow-up appointments you made. If you do not go to an appointment, you could develop lasting (chronic) problems with pain. If there is any problem keeping an appointment, call to reschedule. SEEK MEDICAL CARE IF:   Your chest pain does not go away, even after treatment.  You have a rash with blisters on your chest.  You have a fever. SEEK IMMEDIATE MEDICAL CARE IF:   You have increased chest pain or pain that spreads to your arm, neck, jaw, back, or abdomen.  You have shortness of breath.  You have an increasing cough, or you cough up blood.  You have severe back or abdominal pain.  You feel nauseous or vomit.  You have severe weakness.  You faint.  You have chills. This is an emergency. Do not wait to see if the pain will go away. Get medical help at once. Call your local emergency services (911 in [place] Do not drive yourself to the hospital. MAKE SURE YOU:   Understand these instructions.  Will watch your condition.  Will get help right away if you are not doing well or get worse. Document Released: 11/09/2004 Document Revised: 02/04/2013 Document Reviewed: 09/05/2007 [facility] Patient Information 2015 [place], [place]. This information is not intended to replace advice given to you by your health care provider. Make sure you discuss any questions you  have with your health care provider.  

## 2014-03-16 NOTE — Progress Notes (Signed)
   Subjective:    Patient ID: [name], male    DOB: [birth year], 69 y.o.   MRN: [id number]  Chest Pain  This is a recurrent problem. The current episode started in the past 7 days ('worse last night"). The onset quality is gradual. The problem occurs intermittently. The problem has been waxing and waning. The pain is present in the lateral region. The pain is at a severity of 7/10. The pain is moderate. The quality of the pain is described as numbness and tightness. The pain radiates to the left arm, right arm, left shoulder and right shoulder. Associated symptoms include back pain, diaphoresis, dizziness, headaches, palpitations and shortness of breath. Pertinent negatives include no cough, exertional chest pressure, fever or lower extremity edema. The pain is aggravated by exertion (xanax). He has tried antacids and rest for the symptoms. The treatment provided significant relief. Risk factors include stress.  His past medical history is significant for anxiety/panic attacks, CAD and hyperlipidemia.  Pertinent negatives for past medical history include no MI and no strokes.  His family medical history is significant for heart disease, hyperlipidemia and hypertension.  Pertinent negatives for family medical history include: no stroke.   *Pt states last night he woke up with sweat and a tightness in his chest. Pt states he took an ativan 0.5 mg and felt much better. Pt states his sister had a MI in the past and it makes him "nervous".    Review of Systems  Constitutional: Positive for diaphoresis. Negative for fever.  Respiratory: Positive for shortness of breath. Negative for cough.   Cardiovascular: Positive for chest pain and palpitations.  Gastrointestinal: Negative.   Endocrine: Negative.   Genitourinary: Negative.   Musculoskeletal: Positive for back pain.  Neurological: Positive for dizziness and headaches.  Hematological: Negative.   Psychiatric/Behavioral: Negative.   All other  systems reviewed and are negative.      Objective:   Physical Exam  Constitutional: He is oriented to person, place, and time. He appears well-developed and well-nourished. No distress.  HENT:  Head: Normocephalic.  Right Ear: External ear normal.  Left Ear: External ear normal.  Mouth/Throat: Oropharynx is clear and moist.  Eyes: Pupils are equal, round, and reactive to light. Right eye exhibits no discharge. Left eye exhibits no discharge.  Neck: Normal range of motion. Neck supple. No thyromegaly present.  Cardiovascular: Normal rate, regular rhythm, normal heart sounds and intact distal pulses.   No murmur heard. Pulmonary/Chest: Effort normal and breath sounds normal. No respiratory distress. He has no wheezes.  Abdominal: Soft. Bowel sounds are normal. He exhibits no distension. There is no tenderness.  Musculoskeletal: Normal range of motion. He exhibits no edema or tenderness.  Neurological: He is alert and oriented to person, place, and time. He has normal reflexes. No cranial nerve deficit.  Skin: Skin is warm and dry. No rash noted. No erythema.  Psychiatric: He has a normal mood and affect. His behavior is normal. Judgment and thought content normal.  Vitals reviewed.   BP 136/80 mmHg  Pulse 65  Ht 5\' 11"  (1.803 m)  EKG- Sinus  Bradycardia     Assessment & Plan:  1. Chest pain, unspecified chest pain type -Rest -If chest becomes worse pt to go straight to ED -Pt to follow-up with Cardiologists - EKG 12-Lead - Troponin I - CKMB  2. GAD (generalized anxiety disorder) -Stress management -Cont daily lexapro and ativan prn  Evelina Dun, FNP   Evelina Dun, [place]

## 2014-03-17 LAB — CREATININE KINASE MB: CK-MB Index: 5.3 ng/mL (ref 0.0–10.4)

## 2014-03-17 LAB — TROPONIN I

## 2014-03-29 ENCOUNTER — Other Ambulatory Visit: Payer: Self-pay | Admitting: Nurse Practitioner

## 2014-04-27 ENCOUNTER — Encounter: Payer: Self-pay | Admitting: Nurse Practitioner

## 2014-04-27 ENCOUNTER — Other Ambulatory Visit: Payer: Self-pay | Admitting: Nurse Practitioner

## 2014-04-27 ENCOUNTER — Ambulatory Visit (INDEPENDENT_AMBULATORY_CARE_PROVIDER_SITE_OTHER): Payer: Medicare Other | Admitting: Nurse Practitioner

## 2014-04-27 VITALS — BP 135/72 | HR 58 | Temp 97.2°F | Ht 71.0 in | Wt 224.0 lb

## 2014-04-27 DIAGNOSIS — F32A Depression, unspecified: Secondary | ICD-10-CM

## 2014-04-27 DIAGNOSIS — Z1212 Encounter for screening for malignant neoplasm of rectum: Secondary | ICD-10-CM

## 2014-04-27 DIAGNOSIS — E785 Hyperlipidemia, unspecified: Secondary | ICD-10-CM

## 2014-04-27 DIAGNOSIS — I6529 Occlusion and stenosis of unspecified carotid artery: Secondary | ICD-10-CM | POA: Diagnosis not present

## 2014-04-27 DIAGNOSIS — F329 Major depressive disorder, single episode, unspecified: Secondary | ICD-10-CM | POA: Diagnosis not present

## 2014-04-27 DIAGNOSIS — F411 Generalized anxiety disorder: Secondary | ICD-10-CM | POA: Diagnosis not present

## 2014-04-27 DIAGNOSIS — E663 Overweight: Secondary | ICD-10-CM | POA: Diagnosis not present

## 2014-04-27 DIAGNOSIS — I25119 Atherosclerotic heart disease of native coronary artery with unspecified angina pectoris: Secondary | ICD-10-CM

## 2014-04-27 MED ORDER — ESCITALOPRAM OXALATE 10 MG PO TABS: ORAL_TABLET | ORAL | Status: DC

## 2014-04-27 MED ORDER — LORAZEPAM 0.5 MG PO TABS: 0.5000 mg | ORAL_TABLET | Freq: Three times a day (TID) | ORAL | Status: DC

## 2014-04-27 MED ORDER — ATORVASTATIN CALCIUM 20 MG PO TABS: ORAL_TABLET | ORAL | Status: DC

## 2014-04-27 NOTE — Progress Notes (Signed)
  Subjective:    Patient ID: [name], male    DOB: Nov 22, 1945, 69 y.o.   MRN: [id number]  Patient here today for follow up of chronic medical problems. No acute problems.   Hyperlipidemia This is a chronic problem. The current episode started more than 1 year ago. The problem is controlled. There are no known factors aggravating his hyperlipidemia. Pertinent negatives include no chest pain or shortness of breath. Current antihyperlipidemic treatment includes statins. There are no compliance problems.  Risk factors for coronary artery disease include male sex, hypertension and dyslipidemia.  CAD- Carotid artery stenosis Plavix daily- hasn't needed to use his nitro in awhile- sees cardiologist every 6 months. Cardiologist appointment in a few months. Depression/GAD Patient currently on lexapro and ativan- doing well on combination- said that meds keep him calm and from being anxious. Vitamin D deficiency Vitamin d OTC daily- no side effects  Review of Systems  Constitutional: Negative for fatigue and unexpected weight change.  HENT: Negative.   Respiratory: Negative for cough, chest tightness and shortness of breath.   Cardiovascular: Negative for chest pain, palpitations and leg swelling.  Gastrointestinal: Negative for diarrhea and constipation.  Endocrine: Negative.   Genitourinary: Negative.   Neurological: Negative.   Psychiatric/Behavioral: Negative.        Objective:   Physical Exam  Constitutional: He is oriented to person, place, and time. He appears well-developed and well-nourished.  HENT:  Head: Normocephalic.  Right Ear: External ear normal.  Left Ear: External ear normal.  Nose: Nose normal.  Mouth/Throat: Oropharynx is clear and moist.  Eyes: EOM are normal. Pupils are equal, round, and reactive to light.  Neck: Normal range of motion. Neck supple. No thyromegaly present.  Cardiovascular: Normal rate, regular rhythm, normal heart sounds and intact distal  pulses.   No murmur heard. Faint bil carotid bruits  Pulmonary/Chest: Effort normal and breath sounds normal. He has no wheezes. He has no rales.  Abdominal: Soft. Bowel sounds are normal.  Genitourinary: Prostate normal and penis normal. Guaiac negative stool.  Musculoskeletal: Normal range of motion.  Neurological: He is alert and oriented to person, place, and time.  Skin: Skin is warm and dry.  Psychiatric: He has a normal mood and affect. His behavior is normal. Judgment and thought content normal.    BP 135/72 mmHg  Pulse 58  Temp(Src) 97.2 F (36.2 C) (Oral)  Ht _0  (1.803 m)  Wt 224 lb (101.606 kg)  BMI 31.26 kg/m2       Assessment & Plan:    1. Hyperlipidemia with target LDL less than 100 Low fat diet  - CMP14+EGFR - NMR, lipoprofile  2. Carotid artery stenosis, unspecified laterality Follow up with cardiologist  3. Atherosclerosis of native coronary artery of native heart with angina pectoris  4. Depression  - escitalopram (LEXAPRO) 10 MG tablet; TAKE 1 TABLET (10 MG TOTAL) BY MOUTH DAILY.  Dispense: 30 tablet; Refill: 5  5. Overweight Discussed diet and exercise for person with BMI >25 Will recheck weight in 3-6 months  6. GAD (generalized anxiety disorder) Stress management  - LORazepam (ATIVAN) 0.5 MG tablet; Take 1 tablet (0.5 mg total) by mouth every 8 (eight) hours.  Dispense: 30 tablet; Refill: 2   hemoccult cards given to patient with directions Labs pending Health maintenance reviewed Diet and exercise encouraged Continue all meds Follow up  In 6 moonths   [place], [place]

## 2014-04-27 NOTE — Patient Instructions (Signed)

## 2014-04-28 LAB — CMP14+EGFR
ALT: 23 IU/L (ref 0–44)
AST: 20 IU/L (ref 0–40)
Albumin/Globulin Ratio: 2.2 (ref 1.1–2.5)
Albumin: 4.7 g/dL (ref 3.6–4.8)
Alkaline Phosphatase: 70 IU/L (ref 39–117)
BUN/Creatinine Ratio: 21 (ref 10–22)
BUN: 17 mg/dL (ref 8–27)
Bilirubin Total: 0.6 mg/dL (ref 0.0–1.2)
CHLORIDE: 102 mmol/L (ref 97–108)
CO2: 24 mmol/L (ref 18–29)
Calcium: 9.7 mg/dL (ref 8.6–10.2)
Creatinine, Ser: 0.8 mg/dL (ref 0.76–1.27)
GFR calc Af Amer: 105 mL/min/{1.73_m2} (ref >59)
GFR, EST NON AFRICAN AMERICAN: 91 mL/min/{1.73_m2} (ref >59)
GLUCOSE: 122 mg/dL — AB (ref 65–99)
Globulin, Total: 2.1 g/dL (ref 1.5–4.5)
Potassium: 5.1 mmol/L (ref 3.5–5.2)
Sodium: 140 mmol/L (ref 134–144)
TOTAL PROTEIN: 6.8 g/dL (ref 6.0–8.5)

## 2014-04-28 LAB — NMR, LIPOPROFILE
Cholesterol: 152 mg/dL (ref 100–199)
HDL Cholesterol by NMR: 52 mg/dL (ref >39)
HDL Particle Number: 36.4 umol/L (ref >=30.5)
LDL Particle Number: 965 nmol/L (ref <1000)
LDL Size: 20.4 nm (ref >20.5)
LDL-C: 83 mg/dL (ref 0–99)
LP-IR Score: 50 — ABNORMAL HIGH (ref <=45)
Small LDL Particle Number: 466 nmol/L (ref <=527)
Triglycerides by NMR: 87 mg/dL (ref 0–149)

## 2014-05-05 NOTE — Addendum Note (Signed)
Addended by: Earlene Plater on: 05/05/2014 11:10 AM   Modules accepted: Orders

## 2014-05-07 LAB — FECAL OCCULT BLOOD, IMMUNOCHEMICAL: Fecal Occult Bld: NEGATIVE

## 2014-09-07 ENCOUNTER — Other Ambulatory Visit: Payer: Self-pay | Admitting: Cardiology

## 2014-09-07 DIAGNOSIS — I6523 Occlusion and stenosis of bilateral carotid arteries: Secondary | ICD-10-CM

## 2014-09-09 ENCOUNTER — Ambulatory Visit (HOSPITAL_COMMUNITY): Payer: Medicare Other | Attending: Cardiology

## 2014-09-09 DIAGNOSIS — I6523 Occlusion and stenosis of bilateral carotid arteries: Secondary | ICD-10-CM | POA: Diagnosis not present

## 2014-10-27 ENCOUNTER — Other Ambulatory Visit: Payer: Self-pay | Admitting: Nurse Practitioner

## 2014-10-27 NOTE — Telephone Encounter (Signed)
Last seen 04/27/14  MMM

## 2014-11-26 ENCOUNTER — Other Ambulatory Visit: Payer: Self-pay | Admitting: Nurse Practitioner

## 2014-11-26 NOTE — Telephone Encounter (Signed)
Last seen 04/27/14 MMM

## 2014-11-30 ENCOUNTER — Other Ambulatory Visit: Payer: Self-pay | Admitting: Nurse Practitioner

## 2014-11-30 NOTE — Telephone Encounter (Signed)
Last refill without being seen 

## 2014-11-30 NOTE — Telephone Encounter (Signed)
Last seen and last lipid 04/27/14 MMM

## 2014-11-30 NOTE — Telephone Encounter (Signed)
Patient aware.

## 2014-12-02 ENCOUNTER — Ambulatory Visit (INDEPENDENT_AMBULATORY_CARE_PROVIDER_SITE_OTHER): Payer: Medicare Other | Admitting: Cardiology

## 2014-12-02 ENCOUNTER — Encounter: Payer: Self-pay | Admitting: Cardiology

## 2014-12-02 VITALS — BP 141/80 | HR 60 | Ht 71.0 in | Wt 219.0 lb

## 2014-12-02 DIAGNOSIS — I2510 Atherosclerotic heart disease of native coronary artery without angina pectoris: Secondary | ICD-10-CM

## 2014-12-02 NOTE — Progress Notes (Signed)
HPI The patient presents with a one year followup. Since I last saw him he has had no new cardiovascular complaints. Last year he had a screening POET (Plain Old Exercise Treadmill) which was negative.  The patient denies any new symptoms such as chest discomfort, neck or arm discomfort. There has been no new shortness of breath, PND or orthopnea. There have been no reported palpitations, presyncope or syncope.  He remains active working in his garage taking care of cars.    No Known Allergies  Current Outpatient Prescriptions  Medication Sig Dispense Refill  . albuterol (PROVENTIL HFA;VENTOLIN HFA) 108 (90 BASE) MCG/ACT inhaler Inhale 2 puffs into the lungs every 4 (four) hours as needed for wheezing or shortness of breath (cough, shortness of breath or wheezing.). 1 Inhaler 1  . aspirin EC 81 MG tablet Take 81 mg by mouth every morning.      Marland Kitchen atorvastatin (LIPITOR) 20 MG tablet TAKE 1 TABLET (20 MG TOTAL) BY MOUTH DAILY. 30 tablet 0  . Cholecalciferol (VITAMIN D) 2000 UNITS CAPS Take 1 capsule by mouth daily.      Marland Kitchen escitalopram (LEXAPRO) 10 MG tablet TAKE 1 TABLET (10 MG TOTAL) BY MOUTH DAILY. 30 tablet 0  . LORazepam (ATIVAN) 0.5 MG tablet Take 0.5 mg by mouth as needed for anxiety.    . Multiple Vitamins-Minerals (MULTIVITAMIN WITH MINERALS) tablet Take 1 tablet by mouth daily.      . nitroGLYCERIN (NITROSTAT) 0.4 MG SL tablet Place 0.4 mg under the tongue every 5 (five) minutes as needed.       No current facility-administered medications for this visit.    Past Medical History  Diagnosis Date  . Coronary artery disease     s/p Taxus stenting to the LAD.  This was 2005. Catheterization in 2008, demonstrated a proximal calcification with ostial 40-50% stenosis, it was a widely patent stent, the circumflex had 40%  proximal stenosis,  the right coronary artery is large and dominant with minor nonobstructive plaque, the EF was 55%.  . Degenerative joint disease   . Depression with  anxiety   . Dyslipidemia   . Sinus bradycardia   . Carotid stenosis     Past Surgical History  Procedure Laterality Date  . Orthopedic surgery      right foot and right hip secondary to DJD    ROS: As stated in the HPI and negative for all other systems.  PHYSICAL EXAM BP 141/80 mmHg  Pulse 60  Ht 5\' 11"  (1.803 m)  Wt 219 lb (99.338 kg)  BMI 30.56 kg/m2  SpO2 97% GENERAL:  Well appearing NECK:  No jugular venous distention, waveform within normal limits, carotid upstroke brisk and symmetric, no bruits, no thyromegaly LYMPHATICS:  No cervical, inguinal adenopathy LUNGS:  Clear to auscultation bilaterally BACK:  No CVA tenderness CHEST:  Unremarkable HEART:  PMI not displaced or sustained,S1 and S2 within normal limits, no S3, no S4, no clicks, no rubs, no murmurs ABD:  Flat, positive bowel sounds normal in frequency in pitch, no bruits, no rebound, no guarding, no midline pulsatile mass, no hepatomegaly, no splenomegaly EXT:  2 plus pulses throughout, no edema, no cyanosis no clubbing   ASSESSMENT AND PLAN  CAD:  The patient has no new sypmtoms since stress testing last year. No further cardiovascular testing is indicated.  We will continue with aggressive risk reduction and meds as listed.  CAROTID STENOSIS:  This is mild and will be followed in 2018.  DYSLIPIDEMIA:  This is followed by Dr. Laurance Flatten.  No change in therapy is indicated.  I reviewed and the LDL was 83with HDL 52

## 2014-12-02 NOTE — Patient Instructions (Signed)
Medication Instructions:  The current medical regimen is effective;  continue present plan and medications.  Follow-Up: Follow up in 1 year with Dr. Hochrein.  You will receive a letter in the mail 2 months before you are due.  Please call us when you receive this letter to schedule your follow up appointment.  Thank you for choosing [facility]!!      

## 2014-12-07 ENCOUNTER — Encounter: Payer: Self-pay | Admitting: Nurse Practitioner

## 2014-12-07 ENCOUNTER — Ambulatory Visit (INDEPENDENT_AMBULATORY_CARE_PROVIDER_SITE_OTHER): Payer: Medicare Other | Admitting: Nurse Practitioner

## 2014-12-07 VITALS — BP 129/77 | HR 66 | Temp 97.3°F | Ht 71.0 in | Wt 222.0 lb

## 2014-12-07 DIAGNOSIS — F329 Major depressive disorder, single episode, unspecified: Secondary | ICD-10-CM

## 2014-12-07 DIAGNOSIS — Z1159 Encounter for screening for other viral diseases: Secondary | ICD-10-CM

## 2014-12-07 DIAGNOSIS — Z6830 Body mass index (BMI) 30.0-30.9, adult: Secondary | ICD-10-CM

## 2014-12-07 DIAGNOSIS — I6529 Occlusion and stenosis of unspecified carotid artery: Secondary | ICD-10-CM | POA: Diagnosis not present

## 2014-12-07 DIAGNOSIS — I25119 Atherosclerotic heart disease of native coronary artery with unspecified angina pectoris: Secondary | ICD-10-CM | POA: Diagnosis not present

## 2014-12-07 DIAGNOSIS — E785 Hyperlipidemia, unspecified: Secondary | ICD-10-CM

## 2014-12-07 DIAGNOSIS — Z23 Encounter for immunization: Secondary | ICD-10-CM

## 2014-12-07 DIAGNOSIS — F32A Depression, unspecified: Secondary | ICD-10-CM

## 2014-12-07 MED ORDER — ATORVASTATIN CALCIUM 20 MG PO TABS: ORAL_TABLET | ORAL | Status: DC

## 2014-12-07 MED ORDER — ESCITALOPRAM OXALATE 10 MG PO TABS: ORAL_TABLET | ORAL | Status: DC

## 2014-12-07 NOTE — Progress Notes (Signed)
  Subjective:    Patient ID: [name], male    DOB: 1945/08/07, 69 y.o.   MRN: [id number]  Patient here today for follow up of chronic medical problems. No acute problems.   Hyperlipidemia This is a chronic problem. The current episode started more than 1 year ago. The problem is controlled. There are no known factors aggravating his hyperlipidemia. Pertinent negatives include no chest pain or shortness of breath. Current antihyperlipidemic treatment includes statins. There are no compliance problems.  Risk factors for coronary artery disease include male sex, hypertension and dyslipidemia.  CAD- Carotid artery stenosis Plavix daily- hasn't needed to use his nitro in awhile- sees cardiologist every 6 months. Just saw Dr. Warren Lacy last week for follow up Depression/GAD Patient currently on lexapro and ativan- doing well on combination- said that meds keep him calm and from being anxious. Vitamin D deficiency Vitamin d OTC daily- no side effects  Review of Systems  Constitutional: Negative for fatigue and unexpected weight change.  HENT: Negative.   Respiratory: Negative for cough, chest tightness and shortness of breath.   Cardiovascular: Negative for chest pain, palpitations and leg swelling.  Gastrointestinal: Negative for diarrhea and constipation.  Endocrine: Negative.   Genitourinary: Negative.   Neurological: Negative.   Psychiatric/Behavioral: Negative.        Objective:   Physical Exam  Constitutional: He is oriented to person, place, and time. He appears well-developed and well-nourished.  HENT:  Head: Normocephalic.  Right Ear: External ear normal.  Left Ear: External ear normal.  Nose: Nose normal.  Mouth/Throat: Oropharynx is clear and moist.  Eyes: EOM are normal. Pupils are equal, round, and reactive to light.  Neck: Normal range of motion. Neck supple. No thyromegaly present.  Cardiovascular: Normal rate, regular rhythm, normal heart sounds and intact distal  pulses.   No murmur heard. Faint bil carotid bruits  Pulmonary/Chest: Effort normal and breath sounds normal. He has no wheezes. He has no rales.  Abdominal: Soft. Bowel sounds are normal.  Genitourinary: Prostate normal and penis normal. Guaiac negative stool.  Musculoskeletal: Normal range of motion.  Neurological: He is alert and oriented to person, place, and time.  Skin: Skin is warm and dry.  Psychiatric: He has a normal mood and affect. His behavior is normal. Judgment and thought content normal.    BP 129/77 mmHg  Pulse 66  Temp(Src) 97.3 F (36.3 C) (Oral)  Ht _0  (1.803 m)  Wt 222 lb (100.699 kg)  BMI 30.98 kg/m2       Assessment & Plan:  1. Hyperlipidemia with target LDL less than 100 Low fat diet and exercise - CMP14+EGFR - Lipid panel - atorvastatin (LIPITOR) 20 MG tablet; TAKE 1 TABLET (20 MG TOTAL) BY MOUTH DAILY.  Dispense: 30 tablet; Refill: 5  2. BMI 30.0-30.9,adult Discussed diet and exercise for person with BMI >25 Will recheck weight in 3-6 months   3. Depression Stress management - escitalopram (LEXAPRO) 10 MG tablet; TAKE 1 TABLET (10 MG TOTAL) BY MOUTH DAILY.  Dispense: 30 tablet; Refill: 5  4. Atherosclerosis of native coronary artery of native heart with angina pectoris [facility]) Keep follow up appointments with cardiologist  5. Carotid artery stenosis, unspecified laterality    Flu vaccine and pneumonia vaccine today Labs pending Health maintenance reviewed Diet and exercise encouraged Continue all meds Follow up  In 6 months   [place], FNP

## 2014-12-07 NOTE — Addendum Note (Signed)
Addended by: Chevis Pretty on: 12/07/2014 03:24 PM   Modules accepted: Orders

## 2014-12-07 NOTE — Patient Instructions (Signed)
Fall Prevention in the Home  Falls can cause injuries and can affect people from all age groups. There are many simple things that you can do to make your home safe and to help prevent falls. WHAT CAN I DO ON THE OUTSIDE OF MY HOME?  Regularly repair the edges of walkways and driveways and fix any cracks.  Remove high doorway thresholds.  Trim any shrubbery on the main path into your home.  Use bright outdoor lighting.  Clear walkways of debris and clutter, including tools and rocks.  Regularly check that handrails are securely fastened and in good repair. Both sides of any steps should have handrails.  Install guardrails along the edges of any raised decks or porches.  Have leaves, snow, and ice cleared regularly.  Use sand or salt on walkways during winter months.  In the garage, clean up any spills right away, including grease or oil spills. WHAT CAN I DO IN THE BATHROOM?  Use night lights.  Install grab bars by the toilet and in the tub and shower. Do not use towel bars as grab bars.  Use non-skid mats or decals on the floor of the tub or shower.  If you need to sit down while you are in the shower, use a plastic, non-slip stool..  Keep the floor dry. Immediately clean up any water that spills on the floor.  Remove soap buildup in the tub or shower on a regular basis.  Attach bath mats securely with double-sided non-slip rug tape.  Remove throw rugs and other tripping hazards from the floor. WHAT CAN I DO IN THE BEDROOM?  Use night lights.  Make sure that a bedside light is easy to reach.  Do not use oversized bedding that drapes onto the floor.  Have a firm chair that has side arms to use for getting dressed.  Remove throw rugs and other tripping hazards from the floor. WHAT CAN I DO IN THE KITCHEN?   Clean up any spills right away.  Avoid walking on wet floors.  Place frequently used items in easy-to-reach places.  If you need to reach for something  above you, use a sturdy step stool that has a grab bar.  Keep electrical cables out of the way.  Do not use floor polish or wax that makes floors slippery. If you have to use wax, make sure that it is non-skid floor wax.  Remove throw rugs and other tripping hazards from the floor. WHAT CAN I DO IN THE STAIRWAYS?  Do not leave any items on the stairs.  Make sure that there are handrails on both sides of the stairs. Fix handrails that are broken or loose. Make sure that handrails are as long as the stairways.  Check any carpeting to make sure that it is firmly attached to the stairs. Fix any carpet that is loose or worn.  Avoid having throw rugs at the top or bottom of stairways, or secure the rugs with carpet tape to prevent them from moving.  Make sure that you have a light switch at the top of the stairs and the bottom of the stairs. If you do not have them, have them installed. WHAT ARE SOME OTHER FALL PREVENTION TIPS?  Wear closed-toe shoes that fit well and support your feet. Wear shoes that have rubber soles or low heels.  When you use a stepladder, make sure that it is completely opened and that the sides are firmly locked. Have someone hold the ladder while you   are using it. Do not climb a closed stepladder.  Add color or contrast paint or tape to grab bars and handrails in your home. Place contrasting color strips on the first and last steps.  Use mobility aids as needed, such as canes, walkers, scooters, and crutches.  Turn on lights if it is dark. Replace any light bulbs that burn out.  Set up furniture so that there are clear paths. Keep the furniture in the same spot.  Fix any uneven floor surfaces.  Choose a carpet design that does not hide the edge of steps of a stairway.  Be aware of any and all pets.  Review your medicines with your healthcare provider. Some medicines can cause dizziness or changes in blood pressure, which increase your risk of falling. Talk  with your health care provider about other ways that you can decrease your risk of falls. This may include working with a physical therapist or trainer to improve your strength, balance, and endurance.   This information is not intended to replace advice given to you by your health care provider. Make sure you discuss any questions you have with your health care provider.   Document Released: 01/20/2002 Document Revised: 06/16/2014 Document Reviewed: 03/06/2014 Elsevier Interactive Patient Education 2016 Elsevier Inc.  

## 2014-12-08 LAB — LIPID PANEL
CHOL/HDL RATIO: 3.1 ratio (ref 0.0–5.0)
Cholesterol, Total: 148 mg/dL (ref 100–199)
HDL: 47 mg/dL (ref >39)
LDL Calculated: 81 mg/dL (ref 0–99)
TRIGLYCERIDES: 98 mg/dL (ref 0–149)
VLDL Cholesterol Cal: 20 mg/dL (ref 5–40)

## 2014-12-08 LAB — CMP14+EGFR
A/G RATIO: 1.8 (ref 1.1–2.5)
ALT: 19 IU/L (ref 0–44)
AST: 21 IU/L (ref 0–40)
Albumin: 4.4 g/dL (ref 3.6–4.8)
Alkaline Phosphatase: 69 IU/L (ref 39–117)
BUN/Creatinine Ratio: 19 (ref 10–22)
BUN: 15 mg/dL (ref 8–27)
Bilirubin Total: 0.5 mg/dL (ref 0.0–1.2)
CALCIUM: 9.7 mg/dL (ref 8.6–10.2)
CHLORIDE: 101 mmol/L (ref 97–106)
CO2: 23 mmol/L (ref 18–29)
Creatinine, Ser: 0.79 mg/dL (ref 0.76–1.27)
GFR calc Af Amer: 106 mL/min/{1.73_m2} (ref >59)
GFR, EST NON AFRICAN AMERICAN: 92 mL/min/{1.73_m2} (ref >59)
GLOBULIN, TOTAL: 2.4 g/dL (ref 1.5–4.5)
Glucose: 88 mg/dL (ref 65–99)
POTASSIUM: 5 mmol/L (ref 3.5–5.2)
SODIUM: 141 mmol/L (ref 136–144)
TOTAL PROTEIN: 6.8 g/dL (ref 6.0–8.5)

## 2014-12-08 LAB — HEPATITIS C ANTIBODY

## 2014-12-09 ENCOUNTER — Ambulatory Visit: Payer: Self-pay | Admitting: Cardiology

## 2014-12-09 DIAGNOSIS — Z23 Encounter for immunization: Secondary | ICD-10-CM | POA: Diagnosis not present

## 2015-04-01 ENCOUNTER — Ambulatory Visit (INDEPENDENT_AMBULATORY_CARE_PROVIDER_SITE_OTHER): Payer: PPO | Admitting: Pediatrics

## 2015-04-01 ENCOUNTER — Encounter: Payer: Self-pay | Admitting: Pediatrics

## 2015-04-01 VITALS — BP 139/72 | HR 65 | Temp 97.5°F | Ht 71.0 in | Wt 223.4 lb

## 2015-04-01 DIAGNOSIS — L0291 Cutaneous abscess, unspecified: Secondary | ICD-10-CM | POA: Diagnosis not present

## 2015-04-01 MED ORDER — MUPIROCIN 2 % EX OINT: TOPICAL_OINTMENT | CUTANEOUS | Status: DC

## 2015-04-01 NOTE — Progress Notes (Signed)
Subjective:    Patient ID: [name], male    DOB: Jul 08, 1945, 70 y.o.   MRN: [id number]  CC: Knot on neck   HPI: [name] is a 70 y.o. male presenting for Knot on neck  Present for past year, comes and goes No fevers Sometimes has some crusting and draining of area. Family wanted him to get it checked out Sometimes sore and painful, bothers him when he wears collars at times. Has not noticed any other similar areas   Depression screen [facility] 2/9 04/01/2015 12/07/2014 04/27/2014 10/27/2013 01/13/2013  Decreased Interest 0 0 0 0 0  Down, Depressed, Hopeless 0 0 0 0 0  PHQ - 2 Score 0 0 0 0 0     Relevant past medical, surgical, family and social history reviewed and updated as indicated. Interim medical history since our last visit reviewed. Allergies and medications reviewed and updated.    ROS: Per HPI unless specifically indicated above  History  Smoking status  . Former Smoker  . Types: Cigarettes  . Quit date: 02/14/2003  Smokeless tobacco  . Never Used    Past Medical History Patient Active Problem List   Diagnosis Date Noted  . BMI 30.0-30.9,adult 04/23/2009  . Hyperlipidemia with target LDL less than 100 04/21/2009  . Depression 04/21/2009  . Coronary atherosclerosis 04/21/2009  . Carotid artery stenosis 04/21/2009  . Osteoarthritis 04/21/2009    Current Outpatient Prescriptions  Medication Sig Dispense Refill  . albuterol (PROVENTIL HFA;VENTOLIN HFA) 108 (90 BASE) MCG/ACT inhaler Inhale 2 puffs into the lungs every 4 (four) hours as needed for wheezing or shortness of breath (cough, shortness of breath or wheezing.). 1 Inhaler 1  . aspirin EC 81 MG tablet Take 81 mg by mouth every morning.      Marland Kitchen atorvastatin (LIPITOR) 20 MG tablet TAKE 1 TABLET (20 MG TOTAL) BY MOUTH DAILY. 30 tablet 5  . Cholecalciferol (VITAMIN D) 2000 UNITS CAPS Take 1 capsule by mouth daily.      Marland Kitchen escitalopram (LEXAPRO) 10 MG tablet TAKE 1 TABLET (10 MG TOTAL) BY MOUTH  DAILY. 30 tablet 5  . Multiple Vitamins-Minerals (MULTIVITAMIN WITH MINERALS) tablet Take 1 tablet by mouth daily.      Marland Kitchen LORazepam (ATIVAN) 0.5 MG tablet Take 0.5 mg by mouth as needed for anxiety. Reported on 04/01/2015    . mupirocin ointment (BACTROBAN) 2 % Twice a day to affected area after packing comes out 30 g 0  . nitroGLYCERIN (NITROSTAT) 0.4 MG SL tablet Place 0.4 mg under the tongue every 5 (five) minutes as needed. Reported on 04/01/2015     No current facility-administered medications for this visit.       Objective:    BP 139/72 mmHg  Pulse 65  Temp(Src) 97.5 F (36.4 C) (Oral)  Ht 5\' 11"  (1.803 m)  Wt 223 lb 6.4 oz (101.334 kg)  BMI 31.17 kg/m2  Wt Readings from Last 3 Encounters:  04/01/15 223 lb 6.4 oz (101.334 kg)  12/07/14 222 lb (100.699 kg)  12/02/14 219 lb (99.338 kg)     Gen: NAD, alert, cooperative with exam, NCAT EYES: EOMI, no scleral injection or icterus ENT:  OP without erythema LYMPH: no cervical LAD CV: NRRR, normal S1/S2, no murmur, distal pulses 2+ b/l Resp: CTABL, no wheezes, normal WOB Ext: No edema, warm Neuro: Alert and oriented Skin: back of neck with superficial 1 cm area of fluctuance, mildly tender, no surrounding erythema     Assessment &  Plan:    [name] was seen today for small abscess without surrounding cellulitis that was incised and drained. See separate procedure note. Wound care discussed.  Diagnoses and all orders for this visit:  Abscess -     mupirocin ointment (BACTROBAN) 2 %; Twice a day to affected area after packing comes out -     Anaerobic and Aerobic Culture  PROCEDURE: After risks, benefits and alternatives discussed with pt and pt decided to procedure with I and D of likely abscess, area was prepped with betadine. 1 mL of lidocaine with epi was injected below the skin. #11 scalpel was used to make a 51mm incision at surface of nodule with return of some purulence and blood. Approx 3 in of iodoform gauze was  packed into incision. Pt tolerated procedure well.   Follow up plan: As needed  Assunta Found, MD [facility] 04/01/2015, 11:37 AM

## 2015-04-01 NOTE — Patient Instructions (Signed)
Warm compresses to encourage draining Pull out small amount of gauze daily

## 2015-04-05 LAB — ANAEROBIC AND AEROBIC CULTURE

## 2015-04-15 ENCOUNTER — Ambulatory Visit (INDEPENDENT_AMBULATORY_CARE_PROVIDER_SITE_OTHER): Payer: PPO | Admitting: Nurse Practitioner

## 2015-04-15 ENCOUNTER — Encounter: Payer: Self-pay | Admitting: Nurse Practitioner

## 2015-04-15 VITALS — BP 134/74 | HR 55 | Temp 97.1°F | Ht 71.0 in | Wt 225.0 lb

## 2015-04-15 DIAGNOSIS — L0291 Cutaneous abscess, unspecified: Secondary | ICD-10-CM | POA: Diagnosis not present

## 2015-04-15 MED ORDER — CEPHALEXIN 500 MG PO CAPS: 500.0000 mg | ORAL_CAPSULE | Freq: Three times a day (TID) | ORAL | Status: DC

## 2015-04-15 NOTE — Patient Instructions (Signed)

## 2015-04-15 NOTE — Progress Notes (Signed)
   Subjective:    Patient ID: [name], male    DOB: 06-28-1945, 70 y.o.   MRN: [id number]  HPI Patient ws in last week and saw Dr. Evette Doffing- he had a abscess on his neck and she I&D it. She also gave him a rx for bactroban ointment. It is really hurting now and is getting bigger again.    Review of Systems  Constitutional: Negative.   HENT: Negative.   Respiratory: Negative.   Cardiovascular: Negative.   Genitourinary: Negative.   Neurological: Negative.   Psychiatric/Behavioral: Negative.   All other systems reviewed and are negative.      Objective:   Physical Exam  Constitutional: He is oriented to person, place, and time. He appears well-developed and well-nourished. No distress.  Cardiovascular: Normal rate, regular rhythm and normal heart sounds.   Pulmonary/Chest: Effort normal and breath sounds normal.  Neurological: He is alert and oriented to person, place, and time.  Skin: Skin is warm.  2cm tender raised erythematous lesion on left posterior neck   BP 134/74 mmHg  Pulse 55  Temp(Src) 97.1 F (36.2 C) (Oral)  Ht 5\' 11"  (1.803 m)  Wt 225 lb (102.059 kg)  BMI 31.39 kg/m2        Assessment & Plan:  1. Abscess Warm compresses Avoid picking or scratching area RTO if no improvement in 2-3 days - cephALEXin (KEFLEX) 500 MG capsule; Take 1 capsule (500 mg total) by mouth 3 (three) times daily.  Dispense: 30 capsule; Refill: 0   Mary-Margaret Hassell Done, FNP

## 2015-04-28 ENCOUNTER — Encounter: Payer: Self-pay | Admitting: Family

## 2015-04-28 ENCOUNTER — Ambulatory Visit (INDEPENDENT_AMBULATORY_CARE_PROVIDER_SITE_OTHER): Payer: PPO | Admitting: Family

## 2015-04-28 ENCOUNTER — Ambulatory Visit (INDEPENDENT_AMBULATORY_CARE_PROVIDER_SITE_OTHER): Payer: PPO

## 2015-04-28 VITALS — BP 139/79 | HR 60 | Temp 97.0°F | Ht 71.0 in | Wt 226.0 lb

## 2015-04-28 DIAGNOSIS — M25532 Pain in left wrist: Secondary | ICD-10-CM

## 2015-04-28 DIAGNOSIS — S63502A Unspecified sprain of left wrist, initial encounter: Secondary | ICD-10-CM | POA: Diagnosis not present

## 2015-04-28 MED ORDER — NAPROXEN 500 MG PO TABS: 500.0000 mg | ORAL_TABLET | Freq: Two times a day (BID) | ORAL | Status: DC

## 2015-04-28 NOTE — Patient Instructions (Signed)
Wrist Sprain °A wrist sprain is a stretch or tear in the strong, fibrous tissues (ligaments) that connect your wrist bones. The ligaments of your wrist may be easily sprained. There are three types of wrist sprains. °· Grade 1. The ligament is not stretched or torn, but the sprain causes pain. °· Grade 2. The ligament is stretched or partially torn. You may be able to move your wrist, but not very much. °· Grade 3. The ligament or muscle completely tears. You may find it difficult or extremely painful to move your wrist even a little. °CAUSES °Often, wrist sprains are a result of a fall or an injury. The force of the impact causes the fibers of your ligament to stretch too much or tear. Common causes of wrist sprains include: °· Overextending your wrist while catching a ball with your hands. °· Repetitive or strenuous extension or bending of your wrist. °· Landing on your hand during a fall. °RISK FACTORS °· Having previous wrist injuries. °· Playing contact sports, such as boxing or wrestling. °· Participating in activities in which falling is common. °· Having poor wrist strength and flexibility. °SIGNS AND SYMPTOMS °· Wrist pain. °· Wrist tenderness. °· Inflammation or bruising of the wrist area. °· Hearing a "pop" or feeling a tear at the time of the injury. °· Decreased wrist movement due to pain, stiffness, or weakness. °DIAGNOSIS °Your health care provider will examine your wrist. In some cases, an X-ray will be taken to make sure you did not break any bones. If your health care provider thinks that you tore a ligament, he or she may order an MRI of your wrist. °TREATMENT °Treatment involves resting and icing your wrist. You may also need to take pain medicines to help lessen pain and inflammation. Your health care provider may recommend keeping your wrist still (immobilized) with a splint to help your sprain heal. When the splint is no longer necessary, you may need to perform strengthening and stretching  exercises. These exercises help you to regain strength and full range of motion in your wrist. Surgery is not usually needed for wrist sprains unless the ligament completely tears. °HOME CARE INSTRUCTIONS °· Rest your wrist. Do not do things that cause pain. °· Wear your wrist splint as directed by your health care provider. °· Take medicines only as directed by your health care provider. °· To ease pain and swelling, apply ice to the injured area. °¨ Put ice in a plastic bag. °¨ Place a towel between your skin and the bag. °¨ Leave the ice on for 20 minutes, 2-3 times a day. °SEEK MEDICAL CARE IF: °· Your pain, discomfort, or swelling gets worse even with treatment. °· You feel sudden numbness in your hand. °  °This information is not intended to replace advice given to you by your health care provider. Make sure you discuss any questions you have with your health care provider. °  °Document Released: 10/03/2013 Document Reviewed: 10/03/2013 °Elsevier Interactive Patient Education ©2016 Elsevier Inc. ° °

## 2015-04-28 NOTE — Progress Notes (Signed)
   Subjective:    Patient ID: [name], male    DOB: 1945/08/10, 70 y.o.   MRN: [id number]  Wrist Pain  The pain is present in the left wrist. This is a new problem. The current episode started in the past 7 days. History of extremity trauma: Pt was using a drill and "twisted his wrist" The problem occurs intermittently. The problem has been unchanged. The quality of the pain is described as aching. The pain is at a severity of 7/10. The pain is mild. Associated symptoms include numbness. Pertinent negatives include no inability to bear weight, itching, joint swelling, limited range of motion, stiffness or tingling. The symptoms are aggravated by activity. He has tried acetaminophen for the symptoms. The treatment provided mild relief.      Review of Systems  Constitutional: Negative.   HENT: Negative.   Respiratory: Negative.   Cardiovascular: Negative.   Gastrointestinal: Negative.   Endocrine: Negative.   Genitourinary: Negative.   Musculoskeletal: Negative.  Negative for stiffness.  Skin: Negative for itching.  Neurological: Positive for numbness. Negative for tingling.  Hematological: Negative.   Psychiatric/Behavioral: Negative.   All other systems reviewed and are negative.      Objective:   Physical Exam  Constitutional: He is oriented to person, place, and time. He appears well-developed and well-nourished. No distress.  HENT:  Head: Normocephalic.  Eyes: Pupils are equal, round, and reactive to light. Right eye exhibits no discharge. Left eye exhibits no discharge.  Neck: Normal range of motion. Neck supple. No thyromegaly present.  Cardiovascular: Normal rate, regular rhythm, normal heart sounds and intact distal pulses.   No murmur heard. Pulmonary/Chest: Effort normal and breath sounds normal. No respiratory distress. He has no wheezes.  Musculoskeletal: Normal range of motion. He exhibits edema (trace amt in left wrist). He exhibits no tenderness.    Neurological: He is alert and oriented to person, place, and time.  Skin: Skin is warm and dry. No rash noted. No erythema.  Psychiatric: He has a normal mood and affect. His behavior is normal. Judgment and thought content normal.  Vitals reviewed.   BP 139/79 mmHg  Pulse 60  Temp(Src) 97 F (36.1 C) (Oral)  Ht 5\' 11"  (1.803 m)  Wt 226 lb (102.513 kg)  BMI 31.53 kg/m2  Left wrist- WNL Preliminary reading by Evelina Dun, FNP [facility]      Assessment & Plan:  1. Left wrist pain - DG Wrist Complete Left; Future  2. Left wrist sprain, initial encounter Rest Ice  Compression Naprosyn BID for next 5 days RTO prn - naproxen (NAPROSYN) 500 MG tablet; Take 1 tablet (500 mg total) by mouth 2 (two) times daily with a meal.  Dispense: 30 tablet; Refill: 0  Evelina Dun, FNP

## 2015-05-18 ENCOUNTER — Encounter: Payer: Self-pay | Admitting: Family

## 2015-05-18 ENCOUNTER — Ambulatory Visit (INDEPENDENT_AMBULATORY_CARE_PROVIDER_SITE_OTHER): Payer: PPO | Admitting: Family

## 2015-05-18 VITALS — BP 120/70 | HR 62 | Temp 98.3°F | Ht 71.0 in | Wt 226.0 lb

## 2015-05-18 DIAGNOSIS — M25532 Pain in left wrist: Secondary | ICD-10-CM

## 2015-05-18 DIAGNOSIS — M778 Other enthesopathies, not elsewhere classified: Secondary | ICD-10-CM | POA: Diagnosis not present

## 2015-05-18 MED ORDER — PREDNISONE 10 MG (21) PO TBPK: 10.0000 mg | ORAL_TABLET | Freq: Every day | ORAL | Status: DC

## 2015-05-18 NOTE — Progress Notes (Signed)
   Subjective:    Patient ID: [name], male    DOB: Nov 12, 1945, 70 y.o.   MRN: [id number]   HPI Pt presents to the office today to recheck left wrist pain. Pt was diagnosed with left wrist sprain. PT was using a drill and states he "twisted" it. Pt had a negative x-ray. Pt states he has intermittent sharp pains of 5 out 10.  PT was given naproxen with mild relief.    Review of Systems  Constitutional: Negative.   HENT: Negative.   Respiratory: Negative.   Cardiovascular: Negative.   Gastrointestinal: Negative.   Endocrine: Negative.   Genitourinary: Negative.   Musculoskeletal: Negative.   Neurological: Negative.   Hematological: Negative.   Psychiatric/Behavioral: Negative.   All other systems reviewed and are negative.      Objective:   Physical Exam  Constitutional: He is oriented to person, place, and time. He appears well-developed and well-nourished. No distress.  HENT:  Head: Normocephalic.  Cardiovascular: Normal rate, regular rhythm, normal heart sounds and intact distal pulses.   No murmur heard. Pulmonary/Chest: Effort normal and breath sounds normal. No respiratory distress. He has no wheezes.  Abdominal: Soft. Bowel sounds are normal. He exhibits no distension. There is no tenderness.  Musculoskeletal: Normal range of motion. He exhibits edema. He exhibits no tenderness.  Decrease ROM with extension   Neurological: He is alert and oriented to person, place, and time.  Skin: Skin is warm and dry. No rash noted. No erythema.  Psychiatric: He has a normal mood and affect. His behavior is normal. Judgment and thought content normal.  Vitals reviewed.   BP 120/70 mmHg  Pulse 62  Temp(Src) 98.3 F (36.8 C) (Oral)  Ht 5\' 11"  (1.803 m)  Wt 226 lb (102.513 kg)  BMI 31.53 kg/m2       Assessment & Plan:  1. Left wrist pain - predniSONE (STERAPRED UNI-PAK 21 TAB) 10 MG (21) TBPK tablet; Take 1 tablet (10 mg total) by mouth daily. As directed x 6 days   Dispense: 21 tablet; Refill: 0  2. Tendinitis of left wrist -Rest -Ice  -Continue naprosyn  -RTO prn  - predniSONE (STERAPRED UNI-PAK 21 TAB) 10 MG (21) TBPK tablet; Take 1 tablet (10 mg total) by mouth daily. As directed x 6 days  Dispense: 21 tablet; Refill: 0  Evelina Dun, FNP

## 2015-05-18 NOTE — Patient Instructions (Addendum)
Wrist Pain There are many things that can cause wrist pain. Some common causes include:  An injury to the wrist area, such as a sprain, strain, or fracture.  Overuse of the joint.  A condition that causes increased pressure on a nerve in the wrist (carpal tunnel syndrome).  Wear and tear of the joints that occurs with aging (osteoarthritis).  A variety of other types of arthritis. Sometimes, the cause of wrist pain is not known. The pain often goes away when you follow your health care provider's instructions for relieving pain at home. If your wrist pain continues, tests may need to be done to diagnose your condition. HOME CARE INSTRUCTIONS Pay attention to any changes in your symptoms. Take these actions to help with your pain:  Rest the wrist area for at least 48 hours or as told by your health care provider.  If directed, apply ice to the injured area:  Put ice in a plastic bag.  Place a towel between your skin and the bag.  Leave the ice on for 20 minutes, 2-3 times per day.  Keep your arm raised (elevated) above the level of your heart while you are sitting or lying down.  If a splint or elastic bandage has been applied, use it as told by your health care provider.  Remove the splint or bandage only as told by your health care provider.  Loosen the splint or bandage if your fingers become numb or have a tingling feeling, or if they turn cold or blue.  Take over-the-counter and prescription medicines only as told by your health care provider.  Keep all follow-up visits as told by your health care provider. This is important. SEEK MEDICAL CARE IF:  Your pain is not helped by treatment.  Your pain gets worse. SEEK IMMEDIATE MEDICAL CARE IF:  Your fingers become swollen.  Your fingers turn white, very red, or cold and blue.  Your fingers are numb or have a tingling feeling.  You have difficulty moving your fingers.   This information is not intended to replace  advice given to you by your health care provider. Make sure you discuss any questions you have with your health care provider.   Document Released: 11/09/2004 Document Revised: 10/21/2014 Document Reviewed: 06/17/2014 Elsevier Interactive Patient Education 2016 Elsevier Inc. Tendinitis Tendinitis is swelling and inflammation of the tendons. Tendons are band-like tissues that connect muscle to bone. Tendinitis commonly occurs in the:   Shoulders (rotator cuff).  Heels (Achilles tendon).  Elbows (triceps tendon). CAUSES Tendinitis is usually caused by overusing the tendon, muscles, and joints involved. When the tissue surrounding a tendon (synovium) becomes inflamed, it is called tenosynovitis. Tendinitis commonly develops in people whose jobs require repetitive motions. SYMPTOMS  Pain.  Tenderness.  Mild swelling. DIAGNOSIS Tendinitis is usually diagnosed by physical exam. Your health care provider may also order X-rays or other imaging tests. TREATMENT Your health care provider may recommend certain medicines or exercises for your treatment. HOME CARE INSTRUCTIONS   Use a sling or splint for as long as directed by your health care provider until the pain decreases.  Put ice on the injured area.  Put ice in a plastic bag.  Place a towel between your skin and the bag.  Leave the ice on for 15-20 minutes, 3-4 times a day, or as directed by your health care provider.  Avoid using the limb while the tendon is painful. Perform gentle range of motion exercises only as directed by your health  care provider. Stop exercises if pain or discomfort increase, unless directed otherwise by your health care provider.  Only take over-the-counter or prescription medicines for pain, discomfort, or fever as directed by your health care provider. SEEK MEDICAL CARE IF:   Your pain and swelling increase.  You develop new, unexplained symptoms, especially increased numbness in the hands. MAKE  SURE YOU:   Understand these instructions.  Will watch your condition.  Will get help right away if you are not doing well or get worse.   This information is not intended to replace advice given to you by your health care provider. Make sure you discuss any questions you have with your health care provider.   Document Released: 01/28/2000 Document Revised: 02/20/2014 Document Reviewed: 04/18/2010 Elsevier Interactive Patient Education [employer].

## 2015-06-09 ENCOUNTER — Ambulatory Visit (INDEPENDENT_AMBULATORY_CARE_PROVIDER_SITE_OTHER): Payer: PPO | Admitting: Nurse Practitioner

## 2015-06-09 ENCOUNTER — Ambulatory Visit (INDEPENDENT_AMBULATORY_CARE_PROVIDER_SITE_OTHER): Payer: PPO

## 2015-06-09 ENCOUNTER — Encounter: Payer: Self-pay | Admitting: Nurse Practitioner

## 2015-06-09 VITALS — BP 137/72 | HR 56 | Temp 97.0°F | Ht 71.0 in | Wt 225.0 lb

## 2015-06-09 DIAGNOSIS — I25119 Atherosclerotic heart disease of native coronary artery with unspecified angina pectoris: Secondary | ICD-10-CM | POA: Diagnosis not present

## 2015-06-09 DIAGNOSIS — Z6831 Body mass index (BMI) 31.0-31.9, adult: Secondary | ICD-10-CM

## 2015-06-09 DIAGNOSIS — Z720 Tobacco use: Secondary | ICD-10-CM | POA: Diagnosis not present

## 2015-06-09 DIAGNOSIS — E559 Vitamin D deficiency, unspecified: Secondary | ICD-10-CM | POA: Insufficient documentation

## 2015-06-09 DIAGNOSIS — Z1212 Encounter for screening for malignant neoplasm of rectum: Secondary | ICD-10-CM | POA: Diagnosis not present

## 2015-06-09 DIAGNOSIS — Z23 Encounter for immunization: Secondary | ICD-10-CM | POA: Diagnosis not present

## 2015-06-09 DIAGNOSIS — Z125 Encounter for screening for malignant neoplasm of prostate: Secondary | ICD-10-CM | POA: Diagnosis not present

## 2015-06-09 DIAGNOSIS — E785 Hyperlipidemia, unspecified: Secondary | ICD-10-CM

## 2015-06-09 DIAGNOSIS — F329 Major depressive disorder, single episode, unspecified: Secondary | ICD-10-CM | POA: Diagnosis not present

## 2015-06-09 DIAGNOSIS — E875 Hyperkalemia: Secondary | ICD-10-CM | POA: Diagnosis not present

## 2015-06-09 DIAGNOSIS — I6529 Occlusion and stenosis of unspecified carotid artery: Secondary | ICD-10-CM | POA: Diagnosis not present

## 2015-06-09 DIAGNOSIS — Z87891 Personal history of nicotine dependence: Secondary | ICD-10-CM

## 2015-06-09 DIAGNOSIS — F32A Depression, unspecified: Secondary | ICD-10-CM

## 2015-06-09 MED ORDER — ESCITALOPRAM OXALATE 10 MG PO TABS: ORAL_TABLET | ORAL | Status: DC

## 2015-06-09 MED ORDER — ATORVASTATIN CALCIUM 20 MG PO TABS: ORAL_TABLET | ORAL | Status: DC

## 2015-06-09 NOTE — Addendum Note (Signed)
Addended by: Rolena Infante on: 06/09/2015 09:21 AM   Modules accepted: Orders

## 2015-06-09 NOTE — Patient Instructions (Signed)

## 2015-06-09 NOTE — Progress Notes (Signed)
Subjective:    Patient ID: [name], male    DOB: [birth year], 70 y.o.   MRN: [id number]  Patient here today for follow up of chronic medical problems.  Outpatient Encounter Prescriptions as of 06/09/2015  Medication Sig  . albuterol (PROVENTIL HFA;VENTOLIN HFA) 108 (90 BASE) MCG/ACT inhaler Inhale 2 puffs into the lungs every 4 (four) hours as needed for wheezing or shortness of breath (cough, shortness of breath or wheezing.).  Marland Kitchen aspirin EC 81 MG tablet Take 81 mg by mouth every morning.    Marland Kitchen atorvastatin (LIPITOR) 20 MG tablet TAKE 1 TABLET (20 MG TOTAL) BY MOUTH DAILY.  Marland Kitchen Cholecalciferol (VITAMIN D) 2000 UNITS CAPS Take 1 capsule by mouth daily.    Marland Kitchen escitalopram (LEXAPRO) 10 MG tablet TAKE 1 TABLET (10 MG TOTAL) BY MOUTH DAILY.  Marland Kitchen LORazepam (ATIVAN) 0.5 MG tablet Take 0.5 mg by mouth as needed for anxiety. Reported on 04/28/2015  . Multiple Vitamins-Minerals (MULTIVITAMIN WITH MINERALS) tablet Take 1 tablet by mouth daily.    . nitroGLYCERIN (NITROSTAT) 0.4 MG SL tablet Place 0.4 mg under the tongue every 5 (five) minutes as needed. Reported on 04/01/2015  . [DISCONTINUED] mupirocin ointment (BACTROBAN) 2 % Twice a day to affected area after packing comes out  . [DISCONTINUED] naproxen (NAPROSYN) 500 MG tablet Take 1 tablet (500 mg total) by mouth 2 (two) times daily with a meal.  . [DISCONTINUED] predniSONE (STERAPRED UNI-PAK 21 TAB) 10 MG (21) TBPK tablet Take 1 tablet (10 mg total) by mouth daily. As directed x 6 days   No facility-administered encounter medications on file as of 06/09/2015.     Hyperlipidemia This is a chronic problem. The current episode started more than 1 year ago. The problem is controlled. There are no known factors aggravating his hyperlipidemia. Pertinent negatives include no chest pain or shortness of breath. Current antihyperlipidemic treatment includes statins. There are no compliance problems.  Risk factors for coronary artery disease include male sex,  hypertension and dyslipidemia.  CAD- Carotid artery stenosis Plavix daily- hasn't needed to use his nitro in awhile- sees cardiologist every 6 months. Just saw Dr. Warren Lacy last week for follow up Depression/GAD Patient currently on lexapro and ativan- doing well on combination- said that meds keep him calm and from being anxious. Vitamin D deficiency Vitamin d OTC daily- no side effects  Review of Systems  Constitutional: Negative for fatigue and unexpected weight change.  HENT: Negative.   Respiratory: Negative for cough, chest tightness and shortness of breath.   Cardiovascular: Negative for chest pain, palpitations and leg swelling.  Gastrointestinal: Negative for diarrhea and constipation.  Endocrine: Negative.   Genitourinary: Negative.   Neurological: Negative.   Psychiatric/Behavioral: Negative.        Objective:   Physical Exam  Constitutional: He is oriented to person, place, and time. He appears well-developed and well-nourished.  HENT:  Head: Normocephalic.  Right Ear: External ear normal.  Left Ear: External ear normal.  Nose: Nose normal.  Mouth/Throat: Oropharynx is clear and moist.  Eyes: EOM are normal. Pupils are equal, round, and reactive to light.  Neck: Normal range of motion. Neck supple. No thyromegaly present.  Cardiovascular: Normal rate, regular rhythm, normal heart sounds and intact distal pulses.   No murmur heard. Faint bil carotid bruits  Pulmonary/Chest: Effort normal and breath sounds normal. He has no wheezes. He has no rales.  Abdominal: Soft. Bowel sounds are normal.  Genitourinary: Prostate normal and penis normal. Guaiac negative stool.  Musculoskeletal:  Normal range of motion.  Neurological: He is alert and oriented to person, place, and time.  Skin: Skin is warm and dry.  Psychiatric: He has a normal mood and affect. His behavior is normal. Judgment and thought content normal.    BP 137/72 mmHg  Pulse 56  Temp(Src) 97 F (36.1 C)  (Oral)  Ht '5\' 11"'  (1.803 m)  Wt 225 lb (102.059 kg)  BMI 31.39 kg/m2   Chest x ray-mild cardiomegaly with atherosclerosis of aorta-Preliminary reading by Ronnald Collum, FNP  [facility]  EKG- Sinus bradycardia-Mary-Margaret Hassell Done, FNP       Assessment & Plan:  1. Atherosclerosis of native coronary artery of native heart with angina pectoris [facility]) Keep follow up with cardiology - EKG 12-Lead  2. Carotid artery stenosis, unspecified laterality  3. Hyperlipidemia with target LDL less than 100 Low fta diet - CMP14+EGFR - Lipid panel - atorvastatin (LIPITOR) 20 MG tablet; TAKE 1 TABLET (20 MG TOTAL) BY MOUTH DAILY.  Dispense: 30 tablet; Refill: 5  4. BMI 31.0-31.9,adult Discussed diet and exercise for person with BMI >25 Will recheck weight in 3-6 months  5. Depression Stress management - escitalopram (LEXAPRO) 10 MG tablet; TAKE 1 TABLET (10 MG TOTAL) BY MOUTH DAILY.  Dispense: 30 tablet; Refill: 5  6. Vitamin D deficiency  7. Screening for malignant neoplasm of the rectum - Fecal occult blood, imunochemical; Future  8. Prostate cancer screening - PSA, total and free  9. Hx of smoking - DG Chest 2 View; Future    Labs pending Health maintenance reviewed Diet and exercise encouraged Continue all meds Follow up  In 6 month   [place], FNP

## 2015-06-10 LAB — LIPID PANEL
CHOL/HDL RATIO: 2.7 ratio (ref 0.0–5.0)
Cholesterol, Total: 157 mg/dL (ref 100–199)
HDL: 59 mg/dL (ref >39)
LDL CALC: 86 mg/dL (ref 0–99)
Triglycerides: 62 mg/dL (ref 0–149)
VLDL CHOLESTEROL CAL: 12 mg/dL (ref 5–40)

## 2015-06-10 LAB — CMP14+EGFR
ALBUMIN: 4.6 g/dL (ref 3.5–4.8)
ALT: 24 IU/L (ref 0–44)
AST: 21 IU/L (ref 0–40)
Albumin/Globulin Ratio: 2.3 — ABNORMAL HIGH (ref 1.2–2.2)
Alkaline Phosphatase: 63 IU/L (ref 39–117)
BUN / CREAT RATIO: 25 — AB (ref 10–24)
BUN: 19 mg/dL (ref 8–27)
Bilirubin Total: 0.4 mg/dL (ref 0.0–1.2)
CALCIUM: 9.9 mg/dL (ref 8.6–10.2)
CO2: 24 mmol/L (ref 18–29)
CREATININE: 0.77 mg/dL (ref 0.76–1.27)
Chloride: 103 mmol/L (ref 96–106)
GFR, EST AFRICAN AMERICAN: 106 mL/min/{1.73_m2} (ref >59)
GFR, EST NON AFRICAN AMERICAN: 92 mL/min/{1.73_m2} (ref >59)
GLUCOSE: 111 mg/dL — AB (ref 65–99)
Globulin, Total: 2 g/dL (ref 1.5–4.5)
Potassium: 5.5 mmol/L — ABNORMAL HIGH (ref 3.5–5.2)
Sodium: 141 mmol/L (ref 134–144)
TOTAL PROTEIN: 6.6 g/dL (ref 6.0–8.5)

## 2015-06-10 LAB — PSA, TOTAL AND FREE
PROSTATE SPECIFIC AG, SERUM: 0.3 ng/mL (ref 0.0–4.0)
PSA, Free Pct: 33.3 %
PSA, Free: 0.1 ng/mL

## 2015-06-11 ENCOUNTER — Other Ambulatory Visit: Payer: PPO

## 2015-06-11 ENCOUNTER — Telehealth: Payer: Self-pay | Admitting: *Deleted

## 2015-06-11 DIAGNOSIS — E875 Hyperkalemia: Secondary | ICD-10-CM | POA: Diagnosis not present

## 2015-06-11 LAB — BMP8+EGFR
BUN / CREAT RATIO: 20 (ref 10–24)
BUN: 19 mg/dL (ref 8–27)
CO2: 24 mmol/L (ref 18–29)
CREATININE: 0.95 mg/dL (ref 0.76–1.27)
Calcium: 9.3 mg/dL (ref 8.6–10.2)
Chloride: 101 mmol/L (ref 96–106)
GFR, EST AFRICAN AMERICAN: 93 mL/min/{1.73_m2} (ref >59)
GFR, EST NON AFRICAN AMERICAN: 81 mL/min/{1.73_m2} (ref >59)
Glucose: 101 mg/dL — ABNORMAL HIGH (ref 65–99)
POTASSIUM: 4.5 mmol/L (ref 3.5–5.2)
SODIUM: 139 mmol/L (ref 134–144)

## 2015-06-11 NOTE — Telephone Encounter (Signed)
Patient aware potassium level on lab work is normal.

## 2015-06-11 NOTE — Addendum Note (Signed)
Addended by: Thana Ates on: 06/11/2015 08:40 AM   Modules accepted: Orders

## 2015-09-29 ENCOUNTER — Encounter: Payer: Self-pay | Admitting: Nurse Practitioner

## 2015-09-29 ENCOUNTER — Ambulatory Visit (INDEPENDENT_AMBULATORY_CARE_PROVIDER_SITE_OTHER): Payer: PPO | Admitting: Nurse Practitioner

## 2015-09-29 VITALS — BP 126/73 | HR 56 | Temp 97.0°F | Ht 71.0 in | Wt 225.0 lb

## 2015-09-29 DIAGNOSIS — F329 Major depressive disorder, single episode, unspecified: Secondary | ICD-10-CM | POA: Diagnosis not present

## 2015-09-29 DIAGNOSIS — I25119 Atherosclerotic heart disease of native coronary artery with unspecified angina pectoris: Secondary | ICD-10-CM | POA: Diagnosis not present

## 2015-09-29 DIAGNOSIS — E785 Hyperlipidemia, unspecified: Secondary | ICD-10-CM

## 2015-09-29 DIAGNOSIS — E559 Vitamin D deficiency, unspecified: Secondary | ICD-10-CM | POA: Diagnosis not present

## 2015-09-29 DIAGNOSIS — Z6831 Body mass index (BMI) 31.0-31.9, adult: Secondary | ICD-10-CM

## 2015-09-29 DIAGNOSIS — F32A Depression, unspecified: Secondary | ICD-10-CM

## 2015-09-29 NOTE — Progress Notes (Signed)
Subjective:    Patient ID: [name], male    DOB: 19-Mar-1945, 70 y.o.   MRN: [id number]  Patient here today for follow up of chronic medical problems.  Outpatient Encounter Prescriptions as of 09/29/2015  Medication Sig  . albuterol (PROVENTIL HFA;VENTOLIN HFA) 108 (90 BASE) MCG/ACT inhaler Inhale 2 puffs into the lungs every 4 (four) hours as needed for wheezing or shortness of breath (cough, shortness of breath or wheezing.).  Marland Kitchen aspirin EC 81 MG tablet Take 81 mg by mouth every morning.    Marland Kitchen atorvastatin (LIPITOR) 20 MG tablet TAKE 1 TABLET (20 MG TOTAL) BY MOUTH DAILY.  Marland Kitchen Cholecalciferol (VITAMIN D) 2000 UNITS CAPS Take 1 capsule by mouth daily.    Marland Kitchen escitalopram (LEXAPRO) 10 MG tablet TAKE 1 TABLET (10 MG TOTAL) BY MOUTH DAILY.  Marland Kitchen LORazepam (ATIVAN) 0.5 MG tablet Take 0.5 mg by mouth as needed for anxiety. Reported on 04/28/2015  . Multiple Vitamins-Minerals (MULTIVITAMIN WITH MINERALS) tablet Take 1 tablet by mouth daily.    . nitroGLYCERIN (NITROSTAT) 0.4 MG SL tablet Place 0.4 mg under the tongue every 5 (five) minutes as needed. Reported on 04/01/2015   No facility-administered encounter medications on file as of 09/29/2015.     Hyperlipidemia  This is a chronic problem. The current episode started more than 1 year ago. The problem is controlled. There are no known factors aggravating his hyperlipidemia. Pertinent negatives include no chest pain or shortness of breath. Current antihyperlipidemic treatment includes statins. There are no compliance problems.  Risk factors for coronary artery disease include male sex, hypertension and dyslipidemia.  CAD- Carotid artery stenosis Plavix daily- hasn't needed to use his nitro in awhile- sees cardiologist every 6 months. Just saw Dr. Warren Lacy last week for follow up Depression/GAD Patient currently on lexapro and ativan- doing well on combination- said that meds keep him calm and from being anxious. Vitamin D deficiency Vitamin d OTC  daily- no side effects  Review of Systems  Constitutional: Negative for fatigue and unexpected weight change.  HENT: Negative.   Respiratory: Negative for cough, chest tightness and shortness of breath.   Cardiovascular: Negative for chest pain, palpitations and leg swelling.  Gastrointestinal: Negative for constipation and diarrhea.  Endocrine: Negative.   Genitourinary: Negative.   Neurological: Negative.   Psychiatric/Behavioral: Negative.        Objective:   Physical Exam  Constitutional: He is oriented to person, place, and time. He appears well-developed and well-nourished.  HENT:  Head: Normocephalic.  Right Ear: External ear normal.  Left Ear: External ear normal.  Nose: Nose normal.  Mouth/Throat: Oropharynx is clear and moist.  Eyes: EOM are normal. Pupils are equal, round, and reactive to light.  Neck: Normal range of motion. Neck supple. No thyromegaly present.  Cardiovascular: Normal rate, regular rhythm, normal heart sounds and intact distal pulses.   No murmur heard. Faint bil carotid bruits  Pulmonary/Chest: Effort normal and breath sounds normal. He has no wheezes. He has no rales.  Abdominal: Soft. Bowel sounds are normal.  Genitourinary: Prostate normal and penis normal. Rectal exam shows guaiac negative stool.  Musculoskeletal: Normal range of motion.  Neurological: He is alert and oriented to person, place, and time.  Skin: Skin is warm and dry.  Psychiatric: He has a normal mood and affect. His behavior is normal. Judgment and thought content normal.    BP 126/73   Pulse (!) 56   Temp 97 F (36.1 C) (Oral)   Ht _0  (  1.803 m)   Wt 225 lb (102.1 kg)   BMI 31.38 kg/m         Assessment & Plan:  1. Atherosclerosis of native coronary artery of native heart with angina pectoris ([place])  2. BMI 31.0-31.9,adult Discussed diet and exercise for person with BMI >25 Will recheck weight in 3-6 months  3. Depression Stress management  4.  Hyperlipidemia with target LDL less than 100 Watch fat in diet - CMP14+EGFR - Lipid panel  5. Vitamin D deficiency    Labs pending Health maintenance reviewed Diet and exercise encouraged Continue all meds Follow up  In 3 month   [place], FNP

## 2015-09-29 NOTE — Patient Instructions (Signed)
Fall Prevention in the Home  Falls can cause injuries and can affect people from all age groups. There are many simple things that you can do to make your home safe and to help prevent falls. WHAT CAN I DO ON THE OUTSIDE OF MY HOME?  Regularly repair the edges of walkways and driveways and fix any cracks.  Remove high doorway thresholds.  Trim any shrubbery on the main path into your home.  Use bright outdoor lighting.  Clear walkways of debris and clutter, including tools and rocks.  Regularly check that handrails are securely fastened and in good repair. Both sides of any steps should have handrails.  Install guardrails along the edges of any raised decks or porches.  Have leaves, snow, and ice cleared regularly.  Use sand or salt on walkways during winter months.  In the garage, clean up any spills right away, including grease or oil spills. WHAT CAN I DO IN THE BATHROOM?  Use night lights.  Install grab bars by the toilet and in the tub and shower. Do not use towel bars as grab bars.  Use non-skid mats or decals on the floor of the tub or shower.  If you need to sit down while you are in the shower, use a plastic, non-slip stool..  Keep the floor dry. Immediately clean up any water that spills on the floor.  Remove soap buildup in the tub or shower on a regular basis.  Attach bath mats securely with double-sided non-slip rug tape.  Remove throw rugs and other tripping hazards from the floor. WHAT CAN I DO IN THE BEDROOM?  Use night lights.  Make sure that a bedside light is easy to reach.  Do not use oversized bedding that drapes onto the floor.  Have a firm chair that has side arms to use for getting dressed.  Remove throw rugs and other tripping hazards from the floor. WHAT CAN I DO IN THE KITCHEN?   Clean up any spills right away.  Avoid walking on wet floors.  Place frequently used items in easy-to-reach places.  If you need to reach for something  above you, use a sturdy step stool that has a grab bar.  Keep electrical cables out of the way.  Do not use floor polish or wax that makes floors slippery. If you have to use wax, make sure that it is non-skid floor wax.  Remove throw rugs and other tripping hazards from the floor. WHAT CAN I DO IN THE STAIRWAYS?  Do not leave any items on the stairs.  Make sure that there are handrails on both sides of the stairs. Fix handrails that are broken or loose. Make sure that handrails are as long as the stairways.  Check any carpeting to make sure that it is firmly attached to the stairs. Fix any carpet that is loose or worn.  Avoid having throw rugs at the top or bottom of stairways, or secure the rugs with carpet tape to prevent them from moving.  Make sure that you have a light switch at the top of the stairs and the bottom of the stairs. If you do not have them, have them installed. WHAT ARE SOME OTHER FALL PREVENTION TIPS?  Wear closed-toe shoes that fit well and support your feet. Wear shoes that have rubber soles or low heels.  When you use a stepladder, make sure that it is completely opened and that the sides are firmly locked. Have someone hold the ladder while you   are using it. Do not climb a closed stepladder.  Add color or contrast paint or tape to grab bars and handrails in your home. Place contrasting color strips on the first and last steps.  Use mobility aids as needed, such as canes, walkers, scooters, and crutches.  Turn on lights if it is dark. Replace any light bulbs that burn out.  Set up furniture so that there are clear paths. Keep the furniture in the same spot.  Fix any uneven floor surfaces.  Choose a carpet design that does not hide the edge of steps of a stairway.  Be aware of any and all pets.  Review your medicines with your healthcare provider. Some medicines can cause dizziness or changes in blood pressure, which increase your risk of falling. Talk  with your health care provider about other ways that you can decrease your risk of falls. This may include working with a physical therapist or trainer to improve your strength, balance, and endurance.   This information is not intended to replace advice given to you by your health care provider. Make sure you discuss any questions you have with your health care provider.   Document Released: 01/20/2002 Document Revised: 06/16/2014 Document Reviewed: 03/06/2014 Elsevier Interactive Patient Education 2016 Elsevier Inc.  

## 2015-09-30 LAB — LIPID PANEL
Chol/HDL Ratio: 3 ratio units (ref 0.0–5.0)
Cholesterol, Total: 143 mg/dL (ref 100–199)
HDL: 48 mg/dL (ref >39)
LDL Calculated: 79 mg/dL (ref 0–99)
TRIGLYCERIDES: 78 mg/dL (ref 0–149)
VLDL CHOLESTEROL CAL: 16 mg/dL (ref 5–40)

## 2015-09-30 LAB — CMP14+EGFR
ALBUMIN: 4.2 g/dL (ref 3.5–4.8)
ALT: 21 IU/L (ref 0–44)
AST: 20 IU/L (ref 0–40)
Albumin/Globulin Ratio: 1.9 (ref 1.2–2.2)
Alkaline Phosphatase: 67 IU/L (ref 39–117)
BILIRUBIN TOTAL: 0.6 mg/dL (ref 0.0–1.2)
BUN/Creatinine Ratio: 18 (ref 10–24)
BUN: 14 mg/dL (ref 8–27)
CALCIUM: 9.4 mg/dL (ref 8.6–10.2)
CHLORIDE: 103 mmol/L (ref 96–106)
CO2: 23 mmol/L (ref 18–29)
CREATININE: 0.8 mg/dL (ref 0.76–1.27)
GFR, EST AFRICAN AMERICAN: 105 mL/min/{1.73_m2} (ref >59)
GFR, EST NON AFRICAN AMERICAN: 91 mL/min/{1.73_m2} (ref >59)
GLUCOSE: 111 mg/dL — AB (ref 65–99)
Globulin, Total: 2.2 g/dL (ref 1.5–4.5)
Potassium: 4.8 mmol/L (ref 3.5–5.2)
Sodium: 142 mmol/L (ref 134–144)
TOTAL PROTEIN: 6.4 g/dL (ref 6.0–8.5)

## 2015-10-11 ENCOUNTER — Other Ambulatory Visit: Payer: PPO

## 2015-10-11 DIAGNOSIS — Z1212 Encounter for screening for malignant neoplasm of rectum: Secondary | ICD-10-CM | POA: Diagnosis not present

## 2015-10-13 LAB — FECAL OCCULT BLOOD, IMMUNOCHEMICAL: FECAL OCCULT BLD: NEGATIVE

## 2015-11-17 ENCOUNTER — Encounter: Payer: Self-pay | Admitting: Cardiology

## 2015-11-30 NOTE — Progress Notes (Signed)
HPI The patient presents with a one year followup. Since I last saw him he has done well.   He had a negative POET (Plain Old Exercise Treadmill) in 2015.  He remains active do car and yard work.  The patient denies any new symptoms such as chest discomfort, neck or arm discomfort. There has been no new shortness of breath, PND or orthopnea. There have been no reported palpitations, presyncope or syncope.  No Known Allergies  Current Outpatient Prescriptions  Medication Sig Dispense Refill  . albuterol (PROVENTIL HFA;VENTOLIN HFA) 108 (90 BASE) MCG/ACT inhaler Inhale 2 puffs into the lungs every 4 (four) hours as needed for wheezing or shortness of breath (cough, shortness of breath or wheezing.). 1 Inhaler 1  . aspirin EC 81 MG tablet Take 81 mg by mouth every morning.      Marland Kitchen atorvastatin (LIPITOR) 20 MG tablet TAKE 1 TABLET (20 MG TOTAL) BY MOUTH DAILY. 30 tablet 5  . Cholecalciferol (VITAMIN D) 2000 UNITS CAPS Take 1 capsule by mouth daily.      Marland Kitchen escitalopram (LEXAPRO) 10 MG tablet TAKE 1 TABLET (10 MG TOTAL) BY MOUTH DAILY. 30 tablet 5  . LORazepam (ATIVAN) 0.5 MG tablet Take 0.5 mg by mouth as needed for anxiety. Reported on 04/28/2015    . Multiple Vitamins-Minerals (MULTIVITAMIN WITH MINERALS) tablet Take 1 tablet by mouth daily.      . nitroGLYCERIN (NITROSTAT) 0.4 MG SL tablet Place 0.4 mg under the tongue every 5 (five) minutes as needed. Reported on 04/01/2015     No current facility-administered medications for this visit.     Past Medical History:  Diagnosis Date  . Carotid stenosis   . Coronary artery disease    s/p Taxus stenting to the LAD.  This was 2005. Catheterization in 2008, demonstrated a proximal calcification with ostial 40-50% stenosis, it was a widely patent stent, the circumflex had 40%  proximal stenosis,  the right coronary artery is large and dominant with minor nonobstructive plaque, the EF was 55%.  . Degenerative joint disease   . Depression with anxiety    . Dyslipidemia   . Sinus bradycardia     Past Surgical History:  Procedure Laterality Date  . ORTHOPEDIC SURGERY     right foot and right hip secondary to DJD    ROS:    As stated in the HPI and negative for all other systems.  PHYSICAL EXAM BP 120/70   Pulse (!) 56   Ht 5\' 11"  (1.803 m)   Wt 224 lb (101.6 kg)   BMI 31.24 kg/m  GENERAL:  Well appearing NECK:  No jugular venous distention, waveform within normal limits, carotid upstroke brisk and symmetric, no bruits, no thyromegaly LUNGS:  Clear to auscultation bilaterally CHEST:  Unremarkable HEART:  PMI not displaced or sustained,S1 and S2 within normal limits, no S3, no S4, no clicks, no rubs, no murmurs ABD:  Flat, positive bowel sounds normal in frequency in pitch, no bruits, no rebound, no guarding, no midline pulsatile mass, no hepatomegaly, no splenomegaly EXT:  2 plus pulses throughout, no edema, no cyanosis no clubbing  Lab Results  Component Value Date   CHOL 143 09/29/2015   TRIG 78 09/29/2015   HDL 48 09/29/2015   LDLCALC 79 09/29/2015   LDLDIRECT 67.0 06/01/2006    EKG:  Sinus rhythm, rate 55, axis within normal limits, intervals within normal limits, no acute ST-T wave changes.  12/01/2015   ASSESSMENT AND PLAN  CAD:  The  patient has no new sypmtoms since stress testing last year. No further cardiovascular testing is indicated.  We will continue with aggressive risk reduction and meds as listed.  CAROTID STENOSIS:  This is mild in July of this year and no follow up is needed.  DYSLIPIDEMIA:  This is followed by Dr. Laurance Flatten.  No change in therapy is indicated.  I reviewed and the LDL was 79 with HDL 48.

## 2015-12-01 ENCOUNTER — Ambulatory Visit (INDEPENDENT_AMBULATORY_CARE_PROVIDER_SITE_OTHER): Payer: PPO | Admitting: Cardiology

## 2015-12-01 ENCOUNTER — Encounter: Payer: Self-pay | Admitting: Cardiology

## 2015-12-01 VITALS — BP 120/70 | HR 56 | Ht 71.0 in | Wt 224.0 lb

## 2015-12-01 DIAGNOSIS — I2510 Atherosclerotic heart disease of native coronary artery without angina pectoris: Secondary | ICD-10-CM

## 2015-12-01 NOTE — Patient Instructions (Signed)

## 2015-12-27 ENCOUNTER — Other Ambulatory Visit: Payer: Self-pay | Admitting: Nurse Practitioner

## 2015-12-27 DIAGNOSIS — F32A Depression, unspecified: Secondary | ICD-10-CM

## 2015-12-27 DIAGNOSIS — E785 Hyperlipidemia, unspecified: Secondary | ICD-10-CM

## 2015-12-27 DIAGNOSIS — F329 Major depressive disorder, single episode, unspecified: Secondary | ICD-10-CM

## 2016-01-03 ENCOUNTER — Ambulatory Visit (INDEPENDENT_AMBULATORY_CARE_PROVIDER_SITE_OTHER): Payer: PPO

## 2016-01-03 DIAGNOSIS — Z23 Encounter for immunization: Secondary | ICD-10-CM | POA: Diagnosis not present

## 2016-01-20 ENCOUNTER — Encounter: Payer: Self-pay | Admitting: Nurse Practitioner

## 2016-01-20 ENCOUNTER — Ambulatory Visit (INDEPENDENT_AMBULATORY_CARE_PROVIDER_SITE_OTHER): Payer: PPO | Admitting: Nurse Practitioner

## 2016-01-20 VITALS — BP 107/71 | HR 62 | Temp 96.8°F | Ht 71.0 in | Wt 224.0 lb

## 2016-01-20 DIAGNOSIS — J0101 Acute recurrent maxillary sinusitis: Secondary | ICD-10-CM

## 2016-01-20 MED ORDER — AZITHROMYCIN 250 MG PO TABS: ORAL_TABLET | ORAL | 0 refills | Status: DC

## 2016-01-20 NOTE — Patient Instructions (Signed)

## 2016-01-20 NOTE — Progress Notes (Signed)
Subjective:     [name] is a 70 y.o. male who presents for evaluation of sinus pain. Symptoms include: clear rhinorrhea, cough, facial pain, headaches, post nasal drip, sinus pressure and ear pain.. Onset of symptoms was 7 days ago. Symptoms have been gradually worsening since that time. Past history is significant for occasional episodes of bronchitis. Patient is a non-smoker.  The following portions of the patient's history were reviewed and updated as appropriate: allergies, current medications, past family history, past medical history, past social history, past surgical history and problem list.  Review of Systems Pertinent items noted in HPI and remainder of comprehensive ROS otherwise negative.   Objective:    BP 107/71   Pulse 62   Temp (!) 96.8 F (36 C) (Oral)   Ht 5\' 11"  (1.803 m)   Wt 224 lb (101.6 kg)   BMI 31.24 kg/m  General appearance: alert, cooperative and mild distress Eyes: conjunctivae/corneas clear. PERRL, EOM's intact. Fundi benign. Ears: normal TM's and external ear canals both ears Nose: clear discharge, moderate congestion, turbinates red, sinus tenderness bilateral Throat: lips, mucosa, and tongue normal; teeth and gums normal Neck: no adenopathy, no carotid bruit, no JVD, supple, symmetrical, trachea midline and thyroid not enlarged, symmetric, no tenderness/mass/nodules Lungs: clear to auscultation bilaterally and dry cough Heart: regular rate and rhythm, S1, S2 normal, no murmur, click, rub or gallop    Assessment:    Acute bacterial sinusitis.    Plan:     1. Take meds as prescribed 2. Use a cool mist humidifier especially during the winter months and when heat has been humid. 3. Use saline nose sprays frequently 4. Saline irrigations of the nose can be very helpful if done frequently.  * 4X daily for 1 week*  * Use of a nettie pot can be helpful with this. Follow directions with this* 5. Drink plenty of fluids 6. Keep thermostat turn  down low 7.For any cough or congestion  Use plain Mucinex- regular strength or max strength is fine   * Children- consult with Pharmacist for dosing 8. For fever or aces or pains- take tylenol or ibuprofen appropriate for age and weight.  * for fevers greater than 101 orally you may alternate ibuprofen and tylenol every  3 hours.   Meds ordered this encounter  Medications  . azithromycin (ZITHROMAX) 250 MG tablet    Sig: Two tablets day one, then one tablet daily next 4 days.    Dispense:  6 tablet    Refill:  0    Order Specific Question:   Supervising Provider    Answer:   Eustaquio Maize [[id number]]   Mary-Margaret Hassell Done, FNP

## 2016-01-27 ENCOUNTER — Telehealth: Payer: Self-pay | Admitting: Nurse Practitioner

## 2016-01-27 NOTE — Telephone Encounter (Signed)
zpak works for 10 days- should not need another antibiotic.

## 2016-01-27 NOTE — Telephone Encounter (Signed)
Pt aware.

## 2016-01-27 NOTE — Telephone Encounter (Signed)
Pt has continued congestion in head and chest Denies fever Finished Zpac Is currently taking Mucinex Prod cough is dark, green Feels like he is more congested in his chest Can pt get another antibiotic Please advise

## 2016-02-03 DIAGNOSIS — D1801 Hemangioma of skin and subcutaneous tissue: Secondary | ICD-10-CM | POA: Diagnosis not present

## 2016-02-03 DIAGNOSIS — L570 Actinic keratosis: Secondary | ICD-10-CM | POA: Diagnosis not present

## 2016-02-03 DIAGNOSIS — L814 Other melanin hyperpigmentation: Secondary | ICD-10-CM | POA: Diagnosis not present

## 2016-02-03 DIAGNOSIS — L821 Other seborrheic keratosis: Secondary | ICD-10-CM | POA: Diagnosis not present

## 2016-03-29 ENCOUNTER — Other Ambulatory Visit: Payer: Self-pay | Admitting: Nurse Practitioner

## 2016-03-29 DIAGNOSIS — F32A Depression, unspecified: Secondary | ICD-10-CM

## 2016-03-29 DIAGNOSIS — E785 Hyperlipidemia, unspecified: Secondary | ICD-10-CM

## 2016-03-29 DIAGNOSIS — H2513 Age-related nuclear cataract, bilateral: Secondary | ICD-10-CM | POA: Diagnosis not present

## 2016-03-29 DIAGNOSIS — F329 Major depressive disorder, single episode, unspecified: Secondary | ICD-10-CM

## 2016-03-29 DIAGNOSIS — Z961 Presence of intraocular lens: Secondary | ICD-10-CM | POA: Diagnosis not present

## 2016-03-29 DIAGNOSIS — H524 Presbyopia: Secondary | ICD-10-CM | POA: Diagnosis not present

## 2016-05-04 ENCOUNTER — Telehealth: Payer: Self-pay | Admitting: Nurse Practitioner

## 2016-05-04 NOTE — Telephone Encounter (Signed)
Informed pt not to worry about TC his [facility] records shows that he did the FOBT on 10/11/15.

## 2016-05-05 ENCOUNTER — Encounter: Payer: Self-pay | Admitting: Physician Assistant

## 2016-05-05 ENCOUNTER — Ambulatory Visit (INDEPENDENT_AMBULATORY_CARE_PROVIDER_SITE_OTHER): Payer: Self-pay | Admitting: Physician Assistant

## 2016-05-05 VITALS — BP 145/79 | HR 94 | Temp 98.3°F | Ht 71.0 in | Wt 220.0 lb

## 2016-05-05 DIAGNOSIS — J40 Bronchitis, not specified as acute or chronic: Secondary | ICD-10-CM | POA: Diagnosis not present

## 2016-05-05 MED ORDER — PREDNISONE 10 MG (21) PO TBPK
ORAL_TABLET | ORAL | 0 refills | Status: DC
Start: 2016-05-05 — End: 2016-11-07

## 2016-05-05 MED ORDER — PSEUDOEPHEDRINE-CODEINE-GG 30-10-100 MG/5ML PO SOLN: 5.0000 mL | Freq: Four times a day (QID) | ORAL | 0 refills | Status: DC | PRN

## 2016-05-05 MED ORDER — DOXYCYCLINE HYCLATE 100 MG PO TABS: 100.0000 mg | ORAL_TABLET | Freq: Two times a day (BID) | ORAL | 0 refills | Status: DC

## 2016-05-05 NOTE — Progress Notes (Signed)
BP (!) 145/79   Pulse 94   Temp 98.3 F (36.8 C) (Oral)   Ht 5\' 11"  (1.803 m)   Wt 220 lb (99.8 kg)   SpO2 97%   BMI 30.68 kg/m    Subjective:    Patient ID: [name], male    DOB: [birth year], 71 y.o.   MRN: [id number]  HPI: [name] is a 71 y.o. male presenting on 05/05/2016 for Sinusitis; Cough; Flank Pain (RIGHT); and Shortness of Breath  Patient with several days of progressing upper respiratory and bronchial symptoms. Initially there was more upper respiratory congestion. This progressed to having significant cough that is productive throughout the day and severe at night. There is occasional wheezing after coughing. They will sometimes have slight dyspnea on exertion. It is productive mucus that is yellow in color. Denies any blood.  Relevant past medical, surgical, family and social history reviewed and updated as indicated. Allergies and medications reviewed and updated.  Past Medical History:  Diagnosis Date  . Carotid stenosis   . Coronary artery disease    s/p Taxus stenting to the LAD.  This was 2005. Catheterization in 2008, demonstrated a proximal calcification with ostial 40-50% stenosis, it was a widely patent stent, the circumflex had 40%  proximal stenosis,  the right coronary artery is large and dominant with minor nonobstructive plaque, the EF was 55%.  . Degenerative joint disease   . Depression with anxiety   . Dyslipidemia   . Sinus bradycardia     Past Surgical History:  Procedure Laterality Date  . ORTHOPEDIC SURGERY     right foot and right hip secondary to DJD    Review of Systems  Constitutional: Negative.  Negative for appetite change and fatigue.  HENT: Negative.   Eyes: Negative.  Negative for pain and visual disturbance.  Respiratory: Negative.  Negative for cough, chest tightness, shortness of breath and wheezing.   Cardiovascular: Negative.  Negative for chest pain, palpitations and leg swelling.  Gastrointestinal: Negative.   Negative for abdominal pain, diarrhea, nausea and vomiting.  Endocrine: Negative.   Genitourinary: Negative.   Musculoskeletal: Negative.   Skin: Negative.  Negative for color change and rash.  Neurological: Negative.  Negative for weakness, numbness and headaches.  Psychiatric/Behavioral: Negative.     Allergies as of 05/05/2016   No Known Allergies     Medication List       Accurate as of 05/05/16  8:48 AM. Always use your most recent med list.          albuterol 108 (90 Base) MCG/ACT inhaler Commonly known as:  PROVENTIL HFA;VENTOLIN HFA Inhale 2 puffs into the lungs every 4 (four) hours as needed for wheezing or shortness of breath (cough, shortness of breath or wheezing.).   aspirin EC 81 MG tablet Take 81 mg by mouth every morning.   atorvastatin 20 MG tablet Commonly known as:  LIPITOR TAKE 1 TABLET (20 MG TOTAL) BY MOUTH DAILY.   doxycycline 100 MG tablet Commonly known as:  VIBRA-TABS Take 1 tablet (100 mg total) by mouth 2 (two) times daily.   escitalopram 10 MG tablet Commonly known as:  LEXAPRO TAKE 1 TABLET (10 MG TOTAL) BY MOUTH DAILY.   LORazepam 0.5 MG tablet Commonly known as:  ATIVAN Take 0.5 mg by mouth as needed for anxiety. Reported on 04/28/2015   multivitamin with minerals tablet Take 1 tablet by mouth daily.   nitroGLYCERIN 0.4 MG SL tablet Commonly known as:  NITROSTAT Place  0.4 mg under the tongue every 5 (five) minutes as needed. Reported on 04/01/2015   predniSONE 10 MG (21) Tbpk tablet Commonly known as:  STERAPRED UNI-PAK 21 TAB As directed x 6 days   pseudoephedrine-codeine-guaifenesin 30-10-100 MG/5ML solution Commonly known as:  MYTUSSIN DAC Take 5-10 mLs by mouth 4 (four) times daily as needed for cough.   Vitamin D 2000 units Caps Take 1 capsule by mouth daily.          Objective:    BP (!) 145/79   Pulse 94   Temp 98.3 F (36.8 C) (Oral)   Ht 5\' 11"  (1.803 m)   Wt 220 lb (99.8 kg)   SpO2 97%   BMI 30.68 kg/m     No Known Allergies  Physical Exam  Constitutional: He appears well-developed and well-nourished.  HENT:  Head: Normocephalic and atraumatic.  Right Ear: Hearing and tympanic membrane normal.  Left Ear: Hearing and tympanic membrane normal.  Nose: Mucosal edema and sinus tenderness present. No nasal deformity. Right sinus exhibits frontal sinus tenderness. Left sinus exhibits frontal sinus tenderness.  Mouth/Throat: Posterior oropharyngeal erythema present.  Eyes: Conjunctivae and EOM are normal. Pupils are equal, round, and reactive to light. Right eye exhibits no discharge. Left eye exhibits no discharge.  Neck: Normal range of motion. Neck supple.  Cardiovascular: Normal rate, regular rhythm and normal heart sounds.   Pulmonary/Chest: Effort normal. No respiratory distress. He has no decreased breath sounds. He has wheezes. He has no rhonchi. He has no rales.  Abdominal: Soft. Bowel sounds are normal.  Musculoskeletal: Normal range of motion.  Skin: Skin is warm and dry.  Nursing note and vitals reviewed.   Results for orders placed or performed in visit on 10/11/15  Fecal occult blood, imunochemical  Result Value Ref Range   Fecal Occult Bld Negative Negative      Assessment & Plan:   1. Bronchitis - doxycycline (VIBRA-TABS) 100 MG tablet; Take 1 tablet (100 mg total) by mouth 2 (two) times daily.  Dispense: 20 tablet; Refill: 0 - predniSONE (STERAPRED UNI-PAK 21 TAB) 10 MG (21) TBPK tablet; As directed x 6 days  Dispense: 21 tablet; Refill: 0 - pseudoephedrine-codeine-guaifenesin (MYTUSSIN DAC) 30-10-100 MG/5ML solution; Take 5-10 mLs by mouth 4 (four) times daily as needed for cough.  Dispense: 240 mL; Refill: 0   Continue all other maintenance medications as listed above.  Follow up plan: Return if symptoms worsen or fail to improve.  Educational handout given for bronchitis  Terald Sleeper PA-C [facility] [address]  [place], [place]  [address] [phone]   05/05/2016, 8:48 AM

## 2016-05-05 NOTE — Patient Instructions (Signed)

## 2016-06-27 ENCOUNTER — Other Ambulatory Visit: Payer: Self-pay | Admitting: Nurse Practitioner

## 2016-06-27 DIAGNOSIS — F329 Major depressive disorder, single episode, unspecified: Secondary | ICD-10-CM

## 2016-06-27 DIAGNOSIS — F32A Depression, unspecified: Secondary | ICD-10-CM

## 2016-06-27 DIAGNOSIS — E785 Hyperlipidemia, unspecified: Secondary | ICD-10-CM

## 2016-08-01 ENCOUNTER — Other Ambulatory Visit: Payer: Self-pay | Admitting: Nurse Practitioner

## 2016-08-01 DIAGNOSIS — F32A Depression, unspecified: Secondary | ICD-10-CM

## 2016-08-01 DIAGNOSIS — E785 Hyperlipidemia, unspecified: Secondary | ICD-10-CM

## 2016-08-01 DIAGNOSIS — F329 Major depressive disorder, single episode, unspecified: Secondary | ICD-10-CM

## 2016-08-28 ENCOUNTER — Other Ambulatory Visit: Payer: Self-pay | Admitting: Physician Assistant

## 2016-08-28 DIAGNOSIS — E785 Hyperlipidemia, unspecified: Secondary | ICD-10-CM

## 2016-08-28 DIAGNOSIS — F32A Depression, unspecified: Secondary | ICD-10-CM

## 2016-08-28 DIAGNOSIS — F329 Major depressive disorder, single episode, unspecified: Secondary | ICD-10-CM

## 2016-09-07 ENCOUNTER — Other Ambulatory Visit: Payer: Self-pay | Admitting: *Deleted

## 2016-09-07 DIAGNOSIS — E785 Hyperlipidemia, unspecified: Secondary | ICD-10-CM

## 2016-09-07 DIAGNOSIS — F329 Major depressive disorder, single episode, unspecified: Secondary | ICD-10-CM

## 2016-09-07 DIAGNOSIS — F32A Depression, unspecified: Secondary | ICD-10-CM

## 2016-09-07 MED ORDER — ATORVASTATIN CALCIUM 20 MG PO TABS: 20.0000 mg | ORAL_TABLET | Freq: Every day | ORAL | 0 refills | Status: DC

## 2016-09-07 NOTE — Addendum Note (Signed)
Addended by: Antonietta Barcelona D on: 09/07/2016 11:39 AM   Modules accepted: Orders

## 2016-10-08 ENCOUNTER — Other Ambulatory Visit: Payer: Self-pay | Admitting: Nurse Practitioner

## 2016-10-08 DIAGNOSIS — F32A Depression, unspecified: Secondary | ICD-10-CM

## 2016-10-08 DIAGNOSIS — F329 Major depressive disorder, single episode, unspecified: Secondary | ICD-10-CM

## 2016-10-10 NOTE — Telephone Encounter (Signed)
Last seen 05/05/16  [name]  MMM PCP

## 2016-11-07 ENCOUNTER — Ambulatory Visit (INDEPENDENT_AMBULATORY_CARE_PROVIDER_SITE_OTHER): Payer: PPO

## 2016-11-07 ENCOUNTER — Ambulatory Visit (INDEPENDENT_AMBULATORY_CARE_PROVIDER_SITE_OTHER): Payer: PPO | Admitting: Family Medicine

## 2016-11-07 ENCOUNTER — Encounter: Payer: Self-pay | Admitting: Family Medicine

## 2016-11-07 VITALS — BP 139/71 | HR 61 | Temp 98.1°F | Ht 71.0 in | Wt 227.0 lb

## 2016-11-07 DIAGNOSIS — M79672 Pain in left foot: Secondary | ICD-10-CM | POA: Diagnosis not present

## 2016-11-07 DIAGNOSIS — M722 Plantar fascial fibromatosis: Secondary | ICD-10-CM | POA: Diagnosis not present

## 2016-11-07 DIAGNOSIS — R200 Anesthesia of skin: Secondary | ICD-10-CM

## 2016-11-07 DIAGNOSIS — R202 Paresthesia of skin: Secondary | ICD-10-CM | POA: Diagnosis not present

## 2016-11-07 LAB — BAYER DCA HB A1C WAIVED: HB A1C (BAYER DCA - WAIVED): 6 % (ref <7.0)

## 2016-11-07 NOTE — Patient Instructions (Signed)
I thank you has something called plantar fasciitis and left foot. I provided you a number of stretches and exercises to perform at home to relieve this. I would suggest that you roll the left heel/foot over a frozen bottle of water, this will help relieve pain and reduce swelling. If your symptoms are persistent or worsen, I would recommend that you consider seeing an orthopedist for possible injection into the affected area. I will contact you will the results of your labs.  If anything is abnormal, I will call you.

## 2016-11-07 NOTE — Progress Notes (Signed)
Subjective: CC: feet bothering him PCP: Chevis Pretty, FNP UVO:[id number] H Meggett is a 71 y.o. male presenting to clinic today for:  1. Heel pain/ tingling of feet Patient reports about a 3-4 day history of left heel pain. He notes that he has markedly difficulty planting his left foot, especially in the morning or after being seated for prolonged amount of time. He denies previous injury. He does note that he worked a lot on his feet. He also reports intermittent numbness and tingling of bilateral feet. He reports this is getting slightly better since onset but still is present. He was somewhat worried about possibly having type 2 diabetes, as he is a strong family history of this. He reports he takes a daily multivitamin. No known exposure to syphilis. No numbness tingling elsewhere in the body. No associated weakness.  No Known Allergies Past Medical History:  Diagnosis Date  . Carotid stenosis   . Coronary artery disease    s/p Taxus stenting to the LAD.  This was 2005. Catheterization in 2008, demonstrated a proximal calcification with ostial 40-50% stenosis, it was a widely patent stent, the circumflex had 40%  proximal stenosis,  the right coronary artery is large and dominant with minor nonobstructive plaque, the EF was 55%.  . Degenerative joint disease   . Depression with anxiety   . Dyslipidemia   . Sinus bradycardia    Family History  Problem Relation Age of Onset  . COPD Mother   . Coronary artery disease Father        stent  . Diabetes Father   . Coronary artery disease Other        s/p MI   Social Hx: non smoker.Current medications reviewed.   ROS: Per HPI  Objective: Office vital signs reviewed. BP 139/71   Pulse 61   Temp 98.1 F (36.7 C) (Oral)   Ht 5\' 11"  (1.803 m)   Wt 227 lb (103 kg)   BMI 31.66 kg/m   Physical Examination:  General: Awake, alert, well nourished, well appearing male, No acute distress Cardio: regular rate Pulm: normal work of  breathing on room air Extremities: warm, well perfused, No edema, cyanosis or clubbing; +1 pulses bilaterally  Left foot: onychomycosis of the great toe appreciated.  Patient has limited active range of motion in dorsiflexion. No tenderness to palpation along the dorsal aspect of the foot. He does have moderate tenderness to palpation to the anterior medial aspect of the calcaneus. No palpable bony abnormalities, no swelling, no erythema, no ecchymosis. MSK: antalgic gait; avoids heel strike on left. Skin: dry; intact; no rashes or lesions Neuro: light touch and pressure sensation of feet in tact bilaterally  No results found.   Assessment/ Plan: 71 y.o. male   1. Numbness and tingling of foot Mild, bilateral. Since 2017, patient has had elevated glucose levels on metabolic panel. Given his strong family history of type 2 diabetes, we'll obtain an A1c to further evaluate. B12 level and TSH also ordered. RPR was considered, however patient has no known exposure to syphilis. If persistent and labs unremarkable, would consider ABIs. Will contact patient with results. - Vitamin B12 - Bayer DCA Hb A1c Waived - TSH  2. Plantar fasciitis, left Clinical diagnosis. X-ray of left foot was obtained to evaluate for possible bone spur. No palpable bony abnormalities or evidence of infection. Given unilateral nature, doubt that this is autoimmune in etiology. We'll treat with conservative measures, handout for home stretches and icing provided. I  did discuss that there is a risk with oral NSAID medications given his heart disease. However, naproxen has been studied and is generally safe for short-term use. Patient was good understanding. If persistent pain, despite conservative measures, we'll plan to refer to orthopedics for possible glucocorticoid injection versus surgical intervention. - DG Foot Complete Left; Future   Orders Placed This Encounter  Procedures  . DG Foot Complete Left    Standing  Status:   Future    Standing Expiration Date:   01/07/2018    Order Specific Question:   Reason for Exam (SYMPTOM  OR DIAGNOSIS REQUIRED)    Answer:   left heel pain, check for bone spurs    Order Specific Question:   Preferred imaging location?    Answer:   Internal    Order Specific Question:   Radiology Contrast Protocol - do NOT remove file path    Answer:   [email or web address]  . Vitamin B12  . Bayer DCA Hb A1c Waived  . TSH   Follow up with PCP prn.  Janora Norlander, DO [place] [phone]

## 2016-11-08 LAB — TSH: TSH: 1.35 u[IU]/mL (ref 0.450–4.500)

## 2016-11-08 LAB — VITAMIN B12: Vitamin B-12: 313 pg/mL (ref 232–1245)

## 2016-11-10 ENCOUNTER — Other Ambulatory Visit: Payer: Self-pay | Admitting: Nurse Practitioner

## 2016-11-10 DIAGNOSIS — F329 Major depressive disorder, single episode, unspecified: Secondary | ICD-10-CM

## 2016-11-10 DIAGNOSIS — F32A Depression, unspecified: Secondary | ICD-10-CM

## 2016-11-19 ENCOUNTER — Other Ambulatory Visit: Payer: Self-pay | Admitting: Nurse Practitioner

## 2016-11-19 DIAGNOSIS — F32A Depression, unspecified: Secondary | ICD-10-CM

## 2016-11-19 DIAGNOSIS — F329 Major depressive disorder, single episode, unspecified: Secondary | ICD-10-CM

## 2016-11-29 NOTE — Progress Notes (Signed)
Subjective: CC: Follow-up foot numbness and tingling/planter fasciitis PCP: Chevis Pretty, FNP [name] is a 71 y.o. male presenting to clinic today for:  1. Planter fasciitis Patient was seen 1 month ago for heel pain and was diagnosed with plantar fasciitis. Home care instructions were provided. Patient was instructed to use naproxen as needed for pain.  He had left foot x-rays which revealed calcaneal and plantar heel spurs. He reports to office today for recheck. Patient notes that since last visit the pain is about the same.  He reports compliance with the home stretches and icing.  He notes that again this is worse when he wakes up in the morning and goes to take his first few steps.  He does also note that pain is significant after being very active throughout the day then having a seat and resting.  He describes pain as a throbbing, sharp pain in his left heel.  No radiation.  No swelling.  No discoloration.  He reports that this is interfering with his ability to be active.  2. Numbness and tingling Patient had a hemoglobin A1c, thyroid testing and B12 obtained which were all within normal limits. Patient reports that numbness and tingling is no longer present.  No Known Allergies Past Medical History:  Diagnosis Date  . Carotid stenosis   . Coronary artery disease    s/p Taxus stenting to the LAD.  This was 2005. Catheterization in 2008, demonstrated a proximal calcification with ostial 40-50% stenosis, it was a widely patent stent, the circumflex had 40%  proximal stenosis,  the right coronary artery is large and dominant with minor nonobstructive plaque, the EF was 55%.  . Degenerative joint disease   . Depression with anxiety   . Dyslipidemia   . Sinus bradycardia    Family History  Problem Relation Age of Onset  . COPD Mother   . Coronary artery disease Father        stent  . Diabetes Father   . Coronary artery disease Other        s/p MI    Current  Outpatient Prescriptions:  .  albuterol (PROVENTIL HFA;VENTOLIN HFA) 108 (90 BASE) MCG/ACT inhaler, Inhale 2 puffs into the lungs every 4 (four) hours as needed for wheezing or shortness of breath (cough, shortness of breath or wheezing.)., Disp: 1 Inhaler, Rfl: 1 .  aspirin EC 81 MG tablet, Take 81 mg by mouth every morning.  , Disp: , Rfl:  .  atorvastatin (LIPITOR) 20 MG tablet, Take 1 tablet (20 mg total) by mouth daily., Disp: 90 tablet, Rfl: 0 .  Cholecalciferol (VITAMIN D) 2000 UNITS CAPS, Take 1 capsule by mouth daily.  , Disp: , Rfl:  .  escitalopram (LEXAPRO) 10 MG tablet, TAKE 1 TABLET BY MOUTH EVERY DAY, Disp: 30 tablet, Rfl: 2 .  LORazepam (ATIVAN) 0.5 MG tablet, Take 0.5 mg by mouth as needed for anxiety. Reported on 04/28/2015, Disp: , Rfl:  .  Multiple Vitamins-Minerals (MULTIVITAMIN WITH MINERALS) tablet, Take 1 tablet by mouth daily.  , Disp: , Rfl:  .  nitroGLYCERIN (NITROSTAT) 0.4 MG SL tablet, Place 0.4 mg under the tongue every 5 (five) minutes as needed. Reported on 04/01/2015, Disp: , Rfl:   Social Hx: former smoker.  Health Maintenance: Flu shot   ROS: Per HPI  Objective: Office vital signs reviewed. BP 137/78   Pulse 63   Temp (!) 96.9 F (36.1 C) (Oral)   Ht 5\' 11"  (1.803 m)  Wt 224 lb 3.2 oz (101.7 kg)   BMI 31.27 kg/m   Physical Examination:  General: Awake, alert, well nourished, No acute distress MSK: Patient has full active range of motion of the left foot.  No discoloration or swelling.  He has tenderness to palpation on the plantar aspect of the left heel.  No tenderness to palpation elsewhere in the foot. Neuro: Light touch sensation grossly intact. Skin: No discoloration or skin breakdown.  Assessment/ Plan: 71 y.o. male   1. Plantar fasciitis, left Patient still within the window of conservative management.  However, pain is significantly interfering with everyday activities and therefore a referral to orthopedic surgery has been placed for  consideration of steroid injection.  I did review the risks and benefits of steroid injection.  Patient voices good understanding and wishes to proceed with referral.  In the interim, I did recommend that he continue home care, stretching and icing.  He will follow-up as needed. - Ambulatory referral to Orthopedic Surgery  2. Heel spur, left - Ambulatory referral to Orthopedic Surgery  3. Encounter for immunization - Flu vaccine HIGH DOSE PF   Orders Placed This Encounter  Procedures  . Flu vaccine HIGH DOSE PF  . Ambulatory referral to Orthopedic Surgery    Referral Priority:   Routine    Referral Type:   Surgical    Referral Reason:   Specialty Services Required    Requested Specialty:   Orthopedic Surgery    Number of Visits Requested:   1   No orders of the defined types were placed in this encounter.    Janora Norlander, DO [place] [phone]

## 2016-12-06 NOTE — Progress Notes (Signed)
HPI The patient presents with a one year followup. Since I last saw him he has done well.  He remains busy in his yard and around his house although he does not exercise routinely.  The patient denies any new symptoms such as chest discomfort, neck or arm discomfort. There has been no new shortness of breath, PND or orthopnea. There have been no reported palpitations, presyncope or syncope.  No Known Allergies  Current Outpatient Prescriptions  Medication Sig Dispense Refill  . albuterol (PROVENTIL HFA;VENTOLIN HFA) 108 (90 BASE) MCG/ACT inhaler Inhale 2 puffs into the lungs every 4 (four) hours as needed for wheezing or shortness of breath (cough, shortness of breath or wheezing.). 1 Inhaler 1  . aspirin EC 81 MG tablet Take 81 mg by mouth every morning.      Marland Kitchen atorvastatin (LIPITOR) 20 MG tablet Take 1 tablet (20 mg total) by mouth daily. 90 tablet 0  . Cholecalciferol (VITAMIN D) 2000 UNITS CAPS Take 1 capsule by mouth daily.      Marland Kitchen escitalopram (LEXAPRO) 10 MG tablet TAKE 1 TABLET BY MOUTH EVERY DAY 30 tablet 2  . LORazepam (ATIVAN) 0.5 MG tablet Take 0.5 mg by mouth as needed for anxiety. Reported on 04/28/2015    . Multiple Vitamins-Minerals (MULTIVITAMIN WITH MINERALS) tablet Take 1 tablet by mouth daily.      . nitroGLYCERIN (NITROSTAT) 0.4 MG SL tablet Place 0.4 mg under the tongue every 5 (five) minutes as needed. Reported on 04/01/2015     No current facility-administered medications for this visit.     Past Medical History:  Diagnosis Date  . Carotid stenosis   . Coronary artery disease    s/p Taxus stenting to the LAD.  This was 2005. Catheterization in 2008, demonstrated a proximal calcification with ostial 40-50% stenosis, it was a widely patent stent, the circumflex had 40%  proximal stenosis,  the right coronary artery is large and dominant with minor nonobstructive plaque, the EF was 55%.  . Degenerative joint disease   . Depression with anxiety   . Dyslipidemia   .  Sinus bradycardia     Past Surgical History:  Procedure Laterality Date  . ORTHOPEDIC SURGERY     right foot and right hip secondary to DJD    ROS:  As stated in the HPI and negative for all other systems.  PHYSICAL EXAM BP 126/78   Pulse 60   Ht 5\' 11"  (1.803 m)   Wt 222 lb (100.7 kg)   BMI 30.96 kg/m   GENERAL:  Well appearing NECK:  No jugular venous distention, waveform within normal limits, carotid upstroke brisk and symmetric, no bruits, no thyromegaly LUNGS:  Clear to auscultation bilaterally CHEST:  Unremarkable HEART:  PMI not displaced or sustained,S1 and S2 within normal limits, no S3, no S4, no clicks, no rubs, no murmurs ABD:  Flat, positive bowel sounds normal in frequency in pitch, no bruits, no rebound, no guarding, no midline pulsatile mass, no hepatomegaly, no splenomegaly EXT:  2 plus pulses throughout, no edema, no cyanosis no clubbing    GENERAL:  Well appearing NECK:  No jugular venous distention, waveform within normal limits, carotid upstroke brisk and symmetric, no bruits, no thyromegaly LUNGS:  Clear to auscultation bilaterally CHEST:  Unremarkable HEART:  PMI not displaced or sustained,S1 and S2 within normal limits, no S3, no S4, no clicks, no rubs, no murmurs ABD:  Flat, positive bowel sounds normal in frequency in pitch, no bruits, no rebound, no guarding,  no midline pulsatile mass, no hepatomegaly, no splenomegaly EXT:  2 plus pulses throughout, no edema, no cyanosis no clubbing  Lab Results  Component Value Date   CHOL 143 09/29/2015   TRIG 78 09/29/2015   HDL 48 09/29/2015   LDLCALC 79 09/29/2015   LDLDIRECT 67.0 06/01/2006    EKG:  Sinus rhythm, rate 60, axis within normal limits, intervals within normal limits, no acute ST-T wave changes.  12/08/2016   ASSESSMENT AND PLAN  CAD:  The patient has no new sypmtoms.  No further cardiovascular testing is indicated.  We will continue with aggressive risk reduction and meds as listed. He  had a negative POET (Plain Old Exercise Treadmill)  in 2015.    CAROTID STENOSIS:  This was very mild two years ago and does not need to be repeated yet.   DYSLIPIDEMIA:  LDL and HDL as above.  Continue current therapy.

## 2016-12-08 ENCOUNTER — Encounter: Payer: Self-pay | Admitting: Cardiology

## 2016-12-08 ENCOUNTER — Ambulatory Visit: Payer: PPO | Admitting: Family Medicine

## 2016-12-08 ENCOUNTER — Ambulatory Visit (INDEPENDENT_AMBULATORY_CARE_PROVIDER_SITE_OTHER): Payer: PPO | Admitting: Cardiology

## 2016-12-08 VITALS — BP 126/78 | HR 60 | Ht 71.0 in | Wt 222.0 lb

## 2016-12-08 DIAGNOSIS — I2510 Atherosclerotic heart disease of native coronary artery without angina pectoris: Secondary | ICD-10-CM

## 2016-12-08 DIAGNOSIS — E785 Hyperlipidemia, unspecified: Secondary | ICD-10-CM

## 2016-12-08 NOTE — Patient Instructions (Signed)
Medication Instructions:  Continue current medications  If you need a refill on your cardiac medications before your next appointment, please call your pharmacy.  Labwork: None ordered  Testing/Procedures: None Ordered   Follow-Up: Your physician wants you to follow-up in: 1 Year. You should receive a reminder letter in the mail two months in advance. If you do not receive a letter, please call our office [phone].    Thank you for choosing [facility]!!      

## 2016-12-11 ENCOUNTER — Encounter: Payer: Self-pay | Admitting: Family Medicine

## 2016-12-11 ENCOUNTER — Ambulatory Visit (INDEPENDENT_AMBULATORY_CARE_PROVIDER_SITE_OTHER): Payer: PPO | Admitting: Family Medicine

## 2016-12-11 VITALS — BP 137/78 | HR 63 | Temp 96.9°F | Ht 71.0 in | Wt 224.2 lb

## 2016-12-11 DIAGNOSIS — M7732 Calcaneal spur, left foot: Secondary | ICD-10-CM | POA: Diagnosis not present

## 2016-12-11 DIAGNOSIS — M722 Plantar fascial fibromatosis: Secondary | ICD-10-CM

## 2016-12-11 DIAGNOSIS — Z23 Encounter for immunization: Secondary | ICD-10-CM

## 2016-12-11 NOTE — Patient Instructions (Signed)
I've placed a referral to Orthopedics in [place].  You should receive a call within the next week for an appointment.  Please continue to do the at home exercises/ stretches and icing.   Heel Spur A heel spur is a bony growth that forms on the bottom of your heel bone (calcaneus). Heel spurs are common and do not always cause pain. However, heel spurs often cause inflammation in the strong band of tissue that runs underneath the bone of your foot (plantar fascia). When this happens, you may feel pain on the bottom of your foot, near your heel. What are the causes? The cause of heel spurs is not completely understood. They may be caused by pressure on the heel. Or, they may stem from the muscle attachments (tendons) near the spur pulling on the heel. What increases the risk? You may be at risk for a heel spur if you:  Are older than 40.  Are overweight.  Have wear and tear arthritis (osteoarthritis).  Have plantar fascia inflammation.  What are the signs or symptoms? Some people have heel spurs but no symptoms. If you do have symptoms, they may include:  Pain in the bottom of your heel.  Pain that is worse when you first get out of bed.  Pain that gets worse after walking or standing.  How is this diagnosed? Your health care provider may diagnose a heel spur based on your symptoms and a physical exam. You may also have an X-ray of your foot to check for a bony growth coming from the calcaneus. How is this treated? Treatment aims to relieve the pain from the heel spur. This may include:  Stretching exercises.  Losing weight.  Wearing specific shoes, inserts, or orthotics for comfort and support.  Wearing splints at night to properly position your feet.  Taking over-the-counter medicine to relieve pain.  Being treated with high-intensity sound waves to break up the heel spur (extracorporeal shock wave therapy).  Getting steroid injections in your heel to reduce swelling and ease  pain.  Having surgery if your heel spur causes long-term (chronic) pain.  Follow these instructions at home:  Take medicines only as directed by your health care provider.  Ask your health care provider if you should use ice or cold packs on the painful areas of your heel or foot.  Avoid activities that cause you pain until you recover or as directed by your health care provider.  Stretch before exercising or being physically active.  Wear supportive shoes that fit well as directed by your health care provider. You might need to buy new shoes. Wearing old shoes or shoes that do not fit correctly may not provide the support that you need.  Lose weight if your health care provider thinks you should. This can relieve pressure on your foot that may be causing pain and discomfort. Contact a health care provider if:  Your pain continues or gets worse. This information is not intended to replace advice given to you by your health care provider. Make sure you discuss any questions you have with your health care provider. Document Released: 03/08/2005 Document Revised: 07/08/2015 Document Reviewed: 04/02/2013 Elsevier Interactive Patient Education  [employer].

## 2016-12-12 ENCOUNTER — Ambulatory Visit (INDEPENDENT_AMBULATORY_CARE_PROVIDER_SITE_OTHER): Payer: PPO | Admitting: Orthopedic Surgery

## 2016-12-14 ENCOUNTER — Ambulatory Visit (INDEPENDENT_AMBULATORY_CARE_PROVIDER_SITE_OTHER): Payer: PPO | Admitting: Orthopaedic Surgery

## 2016-12-14 ENCOUNTER — Encounter (INDEPENDENT_AMBULATORY_CARE_PROVIDER_SITE_OTHER): Payer: Self-pay | Admitting: Orthopaedic Surgery

## 2016-12-14 VITALS — BP 124/65 | HR 60 | Ht 71.0 in | Wt 225.0 lb

## 2016-12-14 DIAGNOSIS — M722 Plantar fascial fibromatosis: Secondary | ICD-10-CM | POA: Diagnosis not present

## 2016-12-14 NOTE — Progress Notes (Signed)
Office Visit Note   Patient: [name]           Date of Birth: [birth year]           MRN: [id number] Visit Date: 12/14/2016              Requested by: Chevis Pretty, [address], [place] [address] PCP: Chevis Pretty, FNP   Assessment & Plan: Visit Diagnoses:  1. Plantar fasciitis of left foot     Plan: Patient is failed anti-inflammatories stretching exercises intermittent ice.  We discussed heel cord stretching standing on a step landing his heel drop.  Injection performed with improvement in his symptoms.  He will continue using a frozen lemonade or orange can.  We discussed shoe wear with increase arch support.  He will return if he has persistent symptoms.  Follow-Up Instructions: Return if symptoms worsen or fail to improve.   Orders:  Orders Placed This Encounter  Procedures  . Foot Injection   No orders of the defined types were placed in this encounter.     Procedures: Foot Inj Date/Time: 12/14/2016 10:37 AM Performed by: Marybelle Killings Authorized by: Rodell Perna C   Condition: Plantar Fasciitis   Location: left plantar fascia muscle   Needle Size:  25 G Medications:  0.33 mL lidocaine 1 %; 1 mL lidocaine (PF) 1 %; 40 mg methylPREDNISolone acetate 40 MG/ML     Clinical Data: No additional findings.   Subjective: Chief Complaint  Patient presents with  . Left Foot - Pain    Left heel pain    HPI 71 year old male with left heel pain present for more than a month which is worse in the morning he is use ice Aleve stretching and has not gotten any improvement.  Denies any specific trauma no past history of heel pain in the past.  No pain on his right heel just left.  He denies back pain no fever chills negative for diabetes.  Review of Systems 14 point review of systems positive for increased BMI, hyperlipidemia, depression,'s coronary artery disease, carotid artery stenosis, vitamin D deficiency and osteoarthritis.   Negative for low back pain.   Objective: Vital Signs: BP 124/65   Pulse 60   Ht 5\' 11"  (1.803 m)   Wt 225 lb (102.1 kg)   BMI 31.38 kg/m   Physical Exam  Constitutional: He is oriented to person, place, and time. He appears well-developed and well-nourished.  HENT:  Head: Normocephalic and atraumatic.  Eyes: EOM are normal. Pupils are equal, round, and reactive to light.  Neck: No tracheal deviation present. No thyromegaly present.  Cardiovascular: Normal rate.  Pulmonary/Chest: Effort normal. He has no wheezes.  Abdominal: Soft. Bowel sounds are normal.  Neurological: He is alert and oriented to person, place, and time.  Skin: Skin is warm and dry. Capillary refill takes less than 2 seconds.  Psychiatric: He has a normal mood and affect. His behavior is normal. Judgment and thought content normal.    Ortho Exam normal posterior tibial function.  Negative hindfoot valgus.  Anterior tib EHL is intact.  No Achilles tendon tenderness.  Tenderness over the plantar fascial origin.  No palpable plantar defects over the plantar fascia no nodules.  No plantar lesions over the skin of the foot.  Negative straight leg raising negative popliteal compression test normal hip range of motion negative Faber test.  Pelvis is level normal heel toe gait.  Has some increased pain with toe walking  on the left.  Specialty Comments:  No specialty comments available.  Imaging: No results found.   PMFS History: Patient Active Problem List   Diagnosis Date Noted  . Vitamin D deficiency 06/09/2015  . BMI 31.0-31.9,adult 04/23/2009  . Hyperlipidemia with target LDL less than 100 04/21/2009  . Depression 04/21/2009  . Coronary atherosclerosis 04/21/2009  . Carotid artery stenosis 04/21/2009  . Osteoarthritis 04/21/2009   Past Medical History:  Diagnosis Date  . Carotid stenosis   . Coronary artery disease    s/p Taxus stenting to the LAD.  This was 2005. Catheterization in 2008, demonstrated a  proximal calcification with ostial 40-50% stenosis, it was a widely patent stent, the circumflex had 40%  proximal stenosis,  the right coronary artery is large and dominant with minor nonobstructive plaque, the EF was 55%.  . Degenerative joint disease   . Depression with anxiety   . Dyslipidemia   . Sinus bradycardia     Family History  Problem Relation Age of Onset  . COPD Mother   . Coronary artery disease Father        stent  . Diabetes Father   . Coronary artery disease Other        s/p MI    Past Surgical History:  Procedure Laterality Date  . ORTHOPEDIC SURGERY     right foot and right hip secondary to DJD   Social History   Occupational History  . Electrician     full-time   Social History Main Topics  . Smoking status: Former Smoker    Types: Cigarettes    Quit date: 02/14/2003  . Smokeless tobacco: Never Used  . Alcohol use No     Comment: quit completely in 2005  . Drug use: No  . Sexual activity: Not on file

## 2016-12-25 ENCOUNTER — Encounter (INDEPENDENT_AMBULATORY_CARE_PROVIDER_SITE_OTHER): Payer: Self-pay | Admitting: Orthopaedic Surgery

## 2016-12-25 MED ORDER — LIDOCAINE HCL (PF) 1 % IJ SOLN
1.0000 mL | INTRAMUSCULAR | Status: AC | PRN
  Administered 2016-12-14: 1 mL

## 2016-12-25 MED ORDER — LIDOCAINE HCL 1 % IJ SOLN
0.3300 mL | INTRAMUSCULAR | Status: AC | PRN
  Administered 2016-12-14: .33 mL

## 2016-12-25 MED ORDER — METHYLPREDNISOLONE ACETATE 40 MG/ML IJ SUSP
40.0000 mg | INTRAMUSCULAR | Status: AC | PRN
  Administered 2016-12-14: 40 mg

## 2017-01-10 ENCOUNTER — Other Ambulatory Visit: Payer: Self-pay | Admitting: *Deleted

## 2017-01-10 DIAGNOSIS — E785 Hyperlipidemia, unspecified: Secondary | ICD-10-CM

## 2017-01-11 MED ORDER — ATORVASTATIN CALCIUM 20 MG PO TABS: 20.0000 mg | ORAL_TABLET | Freq: Every day | ORAL | 0 refills | Status: DC

## 2017-01-24 ENCOUNTER — Ambulatory Visit: Payer: PPO | Admitting: Pediatrics

## 2017-01-24 ENCOUNTER — Telehealth: Payer: Self-pay

## 2017-01-24 VITALS — BP 127/75 | HR 68 | Temp 97.8°F | Resp 18 | Ht 71.0 in | Wt 230.8 lb

## 2017-01-24 DIAGNOSIS — R0789 Other chest pain: Secondary | ICD-10-CM | POA: Diagnosis not present

## 2017-01-24 DIAGNOSIS — F419 Anxiety disorder, unspecified: Secondary | ICD-10-CM

## 2017-01-24 MED ORDER — ESCITALOPRAM OXALATE 20 MG PO TABS: 20.0000 mg | ORAL_TABLET | Freq: Every day | ORAL | 2 refills | Status: DC

## 2017-01-24 NOTE — Telephone Encounter (Signed)
[facility] received a referral from Dr. Evette Doffing.  Writer was informed by the patient that he was not able to speak because  he was in the grocery store and he would call me back.

## 2017-01-24 NOTE — Progress Notes (Signed)
Subjective:   Patient ID: [name], male    DOB: June 05, 1945, 71 y.o.   MRN: [id number] CC: Chest pain  HPI: [name] is a 71 y.o. male presenting for chest discomfort  Says he woke up this morning around 2 AM, felt a "warm" feeling in his chest Lasted for a second and then went away Was not able to go to sleep Has been worrying more recently, says it is due to the time of year He has been feeling more anxious recently He took a lorazepam tablet that he had at home and was able to go back to sleep within the hour He woke up this morning and was able to do some shoveling snow of the sidewalk for 15-30 minutes without any chest pain, shortness of breath, left arm pain or tingling  He feels his normal self but says he has had a warm feeling in the left side of his chest come on a few more times, always lasting for less than a couple of seconds before it goes away  He was shoveling snow several times yesterday without chest pain He has been using a tractor is well, turning around to look over his right shoulder he is gotten tired some Says his left arm now feels somewhat fatigued from all of the shoveling  Last treadmill test several years ago was normal He did have a stent placed in 2005 Does have a history of a stent placed Patient says he has nitroglycerin at home but did not think that the feeling he had last night warranted nitroglycerin He says his mood has been okay He feels safe at home, no thoughts of self-harm He takes Lexapro 10 mg daily this is helped his anxiety a lot  He says he feels his normal self right now, no chest pressure, no chest pain Relevant past medical, surgical, family and social history reviewed. Allergies and medications reviewed and updated. Social History   Tobacco Use  Smoking Status Former Smoker  . Types: Cigarettes  . Last attempt to quit: 02/14/2003  . Years since quitting: 13.9  Smokeless Tobacco Never Used   ROS: Per HPI    Objective:    BP 127/75 (BP Location: Left Arm, Patient Position: Sitting, Cuff Size: Normal)   Pulse 68   Temp 97.8 F (36.6 C) (Oral)   Resp 18   Ht 5\' 11"  (1.803 m)   Wt 230 lb 12.8 oz (104.7 kg)   SpO2 96%   BMI 32.19 kg/m   Wt Readings from Last 3 Encounters:  01/24/17 230 lb 12.8 oz (104.7 kg)  12/14/16 225 lb (102.1 kg)  12/11/16 224 lb 3.2 oz (101.7 kg)    Gen: NAD, alert, cooperative with exam, NCAT EYES: EOMI, no conjunctival injection, or no icterus ENT:  TMs pearly gray b/l, OP without erythema LYMPH: no cervical LAD CV: NRRR, normal S1/S2, no murmur, distal pulses 2+ b/l Resp: CTABL, no wheezes, normal WOB Abd: +BS, soft, NTND. no guarding or organomegaly Ext: No edema, warm Neuro: Alert and oriented, strength equal b/l UE and LE, coordination grossly normal MSK: normal muscle bulk  EKG: Appears similar to last EKG, heart rate 64, Q waves present in I, II, aVL.  No T wave inversions, acute ST changes  Assessment & Plan:  [name] was seen today for chest pain.  Diagnoses and all orders for this visit:  Other chest pain EKG unchanged from baseline, no chest pain, pressure or other symptoms despite fairly vigorous activity  with shoveling snow yesterday and today Suspect symptoms primarily due to anxiety Return precautions discussed at length -     EKG 12-Lead  Anxiety Ongoing symptoms, will increase Lexapro to 20 mg Originally started for depression Anxiety has been worse this time a year Open to [place] program #5 additional lorazepam given in Rx, goal to not need any further refills -     escitalopram (LEXAPRO) 20 MG tablet; Take 1 tablet (20 mg total) by mouth daily.   Follow up plan: Return in about 3 months (around 04/24/2017). Assunta Found, MD [place]

## 2017-01-31 ENCOUNTER — Telehealth: Payer: Self-pay

## 2017-01-31 ENCOUNTER — Telehealth (HOSPITAL_COMMUNITY): Payer: Self-pay

## 2017-01-31 NOTE — Telephone Encounter (Signed)
Patient was referred to [facility] through staff messaging by Dr. Evette Doffing.  Patient denies SI/HI/Psychosis/Substance Abuse.  Patient PHQ-9 score is 0.  Patient reports that he does not want to see a psychiatrist.  Patient wants to continue receiving his medication from his PCP.  Patient declines counseling services.

## 2017-01-31 NOTE — Telephone Encounter (Signed)
Writer left a voice mail message 

## 2017-02-21 ENCOUNTER — Telehealth: Payer: Self-pay

## 2017-02-21 NOTE — Telephone Encounter (Signed)
[facility]  Writer left a voice mail message in order to schedule an appt.   

## 2017-03-19 ENCOUNTER — Other Ambulatory Visit: Payer: Self-pay | Admitting: Nurse Practitioner

## 2017-03-19 DIAGNOSIS — F329 Major depressive disorder, single episode, unspecified: Secondary | ICD-10-CM

## 2017-03-19 DIAGNOSIS — F32A Depression, unspecified: Secondary | ICD-10-CM

## 2017-04-14 ENCOUNTER — Other Ambulatory Visit: Payer: Self-pay | Admitting: Nurse Practitioner

## 2017-04-14 DIAGNOSIS — E785 Hyperlipidemia, unspecified: Secondary | ICD-10-CM

## 2017-04-16 ENCOUNTER — Other Ambulatory Visit: Payer: Self-pay | Admitting: Pediatrics

## 2017-04-16 DIAGNOSIS — F419 Anxiety disorder, unspecified: Secondary | ICD-10-CM

## 2017-04-16 NOTE — Telephone Encounter (Signed)
Last lipid 09/29/15

## 2017-04-24 ENCOUNTER — Ambulatory Visit (INDEPENDENT_AMBULATORY_CARE_PROVIDER_SITE_OTHER): Payer: PPO | Admitting: Pediatrics

## 2017-04-24 ENCOUNTER — Encounter: Payer: Self-pay | Admitting: Pediatrics

## 2017-04-24 VITALS — BP 130/78 | HR 62 | Temp 97.4°F | Ht 71.0 in | Wt 230.2 lb

## 2017-04-24 DIAGNOSIS — F329 Major depressive disorder, single episode, unspecified: Secondary | ICD-10-CM

## 2017-04-24 DIAGNOSIS — Z6832 Body mass index (BMI) 32.0-32.9, adult: Secondary | ICD-10-CM | POA: Diagnosis not present

## 2017-04-24 DIAGNOSIS — E785 Hyperlipidemia, unspecified: Secondary | ICD-10-CM | POA: Diagnosis not present

## 2017-04-24 DIAGNOSIS — Z1211 Encounter for screening for malignant neoplasm of colon: Secondary | ICD-10-CM

## 2017-04-24 DIAGNOSIS — F419 Anxiety disorder, unspecified: Secondary | ICD-10-CM

## 2017-04-24 DIAGNOSIS — F32A Depression, unspecified: Secondary | ICD-10-CM

## 2017-04-24 LAB — BMP8+EGFR
BUN/Creatinine Ratio: 15 (ref 10–24)
BUN: 13 mg/dL (ref 8–27)
CO2: 23 mmol/L (ref 20–29)
Calcium: 9.8 mg/dL (ref 8.6–10.2)
Chloride: 103 mmol/L (ref 96–106)
Creatinine, Ser: 0.89 mg/dL (ref 0.76–1.27)
GFR calc Af Amer: 99 mL/min/1.73
GFR calc non Af Amer: 85 mL/min/1.73
Glucose: 105 mg/dL — ABNORMAL HIGH (ref 65–99)
Potassium: 4.9 mmol/L (ref 3.5–5.2)
Sodium: 142 mmol/L (ref 134–144)

## 2017-04-24 MED ORDER — ESCITALOPRAM OXALATE 20 MG PO TABS: 20.0000 mg | ORAL_TABLET | Freq: Every day | ORAL | 2 refills | Status: DC

## 2017-04-24 MED ORDER — ATORVASTATIN CALCIUM 20 MG PO TABS: 20.0000 mg | ORAL_TABLET | Freq: Every day | ORAL | 2 refills | Status: DC

## 2017-04-24 NOTE — Progress Notes (Signed)
  Subjective:   Patient ID: [name], male    DOB: [birth year], 72 y.o.   MRN: [id number] CC: Follow-up (3 month) Multiple medical problems HPI: [name] is a 72 y.o. male presenting for Follow-up (3 month)  Elevated BMI: walking regularly throughout the day now.  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  2 years ago was walking 15-20 blocks daily.  He would like to get back to that.  No shortness of breath or chest pain with exertion right now.  Does have a family history of diabetes.  Says he is snacking more than what he usually does in the winter months.  Anxiety: says anxiety level is "great". Taking lexapro daily.  Says he has taken 1 of 5 tabs of lorazepam since last visit.    HLD: taking lipitor at night.  No side effects  Relevant past medical, surgical, family and social history reviewed. Allergies and medications reviewed and updated. Social History   Tobacco Use  Smoking Status Former Smoker  . Types: Cigarettes  . Last attempt to quit: 02/14/2003  . Years since quitting: 14.2  Smokeless Tobacco Never Used   ROS: Per HPI   Objective:    BP 130/78   Pulse 62   Temp (!) 97.4 F (36.3 C) (Oral)   Ht '5\' 11"'$  (1.803 m)   Wt 230 lb 3.2 oz (104.4 kg)   BMI 32.11 kg/m   Wt Readings from Last 3 Encounters:  04/24/17 230 lb 3.2 oz (104.4 kg)  01/24/17 230 lb 12.8 oz (104.7 kg)  12/14/16 225 lb (102.1 kg)    Gen: NAD, alert, cooperative with exam, NCAT EYES: EOMI, no conjunctival injection, or no icterus CV: NRRR, normal S1/S2, no murmur, distal pulses 2+ b/l Resp: CTABL, no wheezes, normal WOB Abd: +BS, soft, NTND.  Ext: No edema, warm Neuro: Alert and oriented, strength equal b/l UE and LE, coordination grossly normal MSK: normal muscle bulk Psych: Normal affect, mood has been good  Assessment & Plan:  Jaxyn was seen today for follow-up multiple medical problems  Diagnoses and all orders for this visit:  Hyperlipidemia with target LDL less than 100 Stable, continue  below. -     atorvastatin (LIPITOR) 20 MG tablet; Take 1 tablet (20 mg total) by mouth daily.  Depression Anxiety Stable, continue below.  He has been a number to virtual behavioral health if symptoms should worsen. -     escitalopram (LEXAPRO) 20 MG tablet; Take 1 tablet (20 mg total) by mouth daily.  Colon cancer screening -     Fecal occult blood, imunochemical; Future  BMI 32.0-32.9,adult Discussed lifestyle changes, increase physical activity, increase fruit and vegetable intake, decrease sugar-containing foods. -     BMP8+EGFR   Follow up plan: Return in about 6 months (around 10/25/2017). Assunta Found, MD [place]

## 2017-04-30 ENCOUNTER — Other Ambulatory Visit: Payer: PPO

## 2017-04-30 DIAGNOSIS — Z1211 Encounter for screening for malignant neoplasm of colon: Secondary | ICD-10-CM

## 2017-05-02 LAB — FECAL OCCULT BLOOD, IMMUNOCHEMICAL: Fecal Occult Bld: NEGATIVE

## 2017-05-07 ENCOUNTER — Other Ambulatory Visit: Payer: Self-pay

## 2017-05-07 ENCOUNTER — Ambulatory Visit (INDEPENDENT_AMBULATORY_CARE_PROVIDER_SITE_OTHER): Payer: PPO

## 2017-05-07 ENCOUNTER — Ambulatory Visit (INDEPENDENT_AMBULATORY_CARE_PROVIDER_SITE_OTHER): Payer: PPO | Admitting: Family Medicine

## 2017-05-07 VITALS — BP 138/76 | HR 99 | Temp 99.1°F | Ht 71.0 in | Wt 227.0 lb

## 2017-05-07 DIAGNOSIS — R059 Cough, unspecified: Secondary | ICD-10-CM

## 2017-05-07 DIAGNOSIS — R05 Cough: Secondary | ICD-10-CM | POA: Diagnosis not present

## 2017-05-07 DIAGNOSIS — J181 Lobar pneumonia, unspecified organism: Secondary | ICD-10-CM

## 2017-05-07 DIAGNOSIS — J189 Pneumonia, unspecified organism: Secondary | ICD-10-CM

## 2017-05-07 MED ORDER — BENZONATATE 200 MG PO CAPS: 200.0000 mg | ORAL_CAPSULE | Freq: Two times a day (BID) | ORAL | 0 refills | Status: DC | PRN

## 2017-05-07 MED ORDER — AZITHROMYCIN 250 MG PO TABS: ORAL_TABLET | ORAL | 0 refills | Status: DC

## 2017-05-07 NOTE — Progress Notes (Signed)
Subjective: CC: Congestion PCP: Eustaquio Maize, MD [name] [name] is a 72 y.o. male presenting to clinic today for:  1. Congestion/ Cough Patient reports a 4-day history of cough, congestion.  He notes that he has coughed so much during coughing spells that he will lose his breath.  He denies hemoptysis.  He denies lower extremity edema.  No orthopnea.  No wheezes, fevers.  He notes that he often feels chilled.  No nausea, vomiting.  He has been using an over-the-counter cough and cold medication with little improvement.   ROS: Per HPI  No Known Allergies Past Medical History:  Diagnosis Date  . Carotid stenosis   . Coronary artery disease    s/p Taxus stenting to the LAD.  This was 2005. Catheterization in 2008, demonstrated a proximal calcification with ostial 40-50% stenosis, it was a widely patent stent, the circumflex had 40%  proximal stenosis,  the right coronary artery is large and dominant with minor nonobstructive plaque, the EF was 55%.  . Degenerative joint disease   . Depression with anxiety   . Dyslipidemia   . Sinus bradycardia     Current Outpatient Medications:  .  aspirin EC 81 MG tablet, Take 81 mg by mouth every morning.  , Disp: , Rfl:  .  atorvastatin (LIPITOR) 20 MG tablet, Take 1 tablet (20 mg total) by mouth daily., Disp: 90 tablet, Rfl: 2 .  Cholecalciferol (VITAMIN D) 2000 UNITS CAPS, Take 1 capsule by mouth daily.  , Disp: , Rfl:  .  escitalopram (LEXAPRO) 20 MG tablet, Take 1 tablet (20 mg total) by mouth daily., Disp: 90 tablet, Rfl: 2 .  LORazepam (ATIVAN) 0.5 MG tablet, Take 0.5 mg by mouth as needed for anxiety. Reported on 04/28/2015, Disp: , Rfl:  .  Multiple Vitamins-Minerals (MULTIVITAMIN WITH MINERALS) tablet, Take 1 tablet by mouth daily.  , Disp: , Rfl:  .  nitroGLYCERIN (NITROSTAT) 0.4 MG SL tablet, Place 0.4 mg under the tongue every 5 (five) minutes as needed. Reported on 04/01/2015, Disp: , Rfl:  Social History   Socioeconomic  History  . Marital status: Legally Separated    Spouse name: Not on file  . Number of children: Not on file  . Years of education: Not on file  . Highest education level: Not on file  Occupational History  . Occupation: Clinical biochemist    Comment: full-time  Social Needs  . Financial resource strain: Not on file  . Food insecurity:    Worry: Not on file    Inability: Not on file  . Transportation needs:    Medical: Not on file    Non-medical: Not on file  Tobacco Use  . Smoking status: Former Smoker    Types: Cigarettes    Last attempt to quit: 02/14/2003    Years since quitting: 14.2  . Smokeless tobacco: Never Used  Substance and Sexual Activity  . Alcohol use: No    Alcohol/week: 0.0 oz    Comment: quit completely in 2005  . Drug use: No  . Sexual activity: Not on file  Lifestyle  . Physical activity:    Days per week: Not on file    Minutes per session: Not on file  . Stress: Not on file  Relationships  . Social connections:    Talks on phone: Not on file    Gets together: Not on file    Attends religious service: Not on file    Active member of club or organization:  Not on file    Attends meetings of clubs or organizations: Not on file    Relationship status: Not on file  . Intimate partner violence:    Fear of current or ex partner: Not on file    Emotionally abused: Not on file    Physically abused: Not on file    Forced sexual activity: Not on file  Other Topics Concern  . Not on file  Social History Narrative  . Not on file   Family History  Problem Relation Age of Onset  . COPD Mother   . Coronary artery disease Father        stent  . Diabetes Father   . Coronary artery disease Other        s/p MI    Objective: Office vital signs reviewed. BP 138/76   Pulse 99   Temp 99.1 F (37.3 C) (Oral)   Ht 5\' 11"  (1.803 m)   Wt 227 lb (103 kg)   SpO2 92%   BMI 31.66 kg/m   Physical Examination:  General: Awake, alert, well nourished, nontoxic. No  acute distress HEENT: Normal    Neck: No masses palpated. No lymphadenopathy    Ears: Tympanic membranes intact, normal light reflex, no erythema, no bulging    Eyes: PERRLA, extraocular membranes intact, sclera white    Nose: nasal turbinates moist, clear nasal discharge    Throat: moist mucus membranes, no erythema, no tonsillar exudate.  Airway is patent Cardio: regular rate and rhythm, S1S2 heard, no murmurs appreciated Pulm: Slight decreased breath sounds noted on the lower left lung fields.  Otherwise, clear to auscultation bilaterally, no wheezes, rhonchi or rales; normal work of breathing on room air  Assessment/ Plan: 72 y.o. male   1. Community acquired pneumonia of left lower lobe of lung [facility]) Patient with low-grade fever here in office to 99.1 F.  His oxygen on room air is 92%.  Chest x-ray was obtained which does appear to show an increased opacity in the left lower lobe, suggestive of pneumonia.  I am still awaiting formal read by radiology.  Azithromycin was sent into pharmacy.  2018 EKG was reviewed, no QTC prolongation noted.  I have also prescribed him Tessalon Perles to use twice a day if needed for cough.  Home care instructions were reviewed with the patient.  Follow-up as needed.  2. Cough - DG Chest 2 View; Future   Orders Placed This Encounter  Procedures  . DG Chest 2 View    Standing Status:   Future    Standing Expiration Date:   07/07/2018    Order Specific Question:   Reason for Exam (SYMPTOM  OR DIAGNOSIS REQUIRED)    Answer:   cough    Order Specific Question:   Preferred imaging location?    Answer:   Internal  . DG Chest 2 View    Standing Status:   Future    Number of Occurrences:   1    Standing Expiration Date:   07/08/2018    Order Specific Question:   Reason for Exam (SYMPTOM  OR DIAGNOSIS REQUIRED)    Answer:   cough, left rib pain, decreased breath sounds on left    Order Specific Question:   Preferred imaging location?    Answer:   Internal     Order Specific Question:   Radiology Contrast Protocol - do NOT remove file path    Answer:   [email or web address]   Meds ordered this encounter  Medications  .  azithromycin (ZITHROMAX Z-PAK) 250 MG tablet    Sig: Take 500mg  day 1, then take 250mg  days 2-5.    Dispense:  6 tablet    Refill:  0  . benzonatate (TESSALON) 200 MG capsule    Sig: Take 1 capsule (200 mg total) by mouth 2 (two) times daily as needed for cough.    Dispense:  20 capsule    Refill:  0    Strict return precautions and reasons for emergent evaluation in the emergency department review with patient.  They voiced understanding and will follow-up as needed.  Janora Norlander, DO [place] [phone]

## 2017-05-07 NOTE — Patient Instructions (Addendum)
Sometimes, the benzonatate Perles or not covered by insurance.  If you find them not to be affordable and wished to use something else, you may use the below medication safely.   Community-Acquired Pneumonia, Adult Pneumonia is an infection of the lungs. One type of pneumonia can happen while a person is in a hospital. A different type can happen when a person is not in a hospital (community-acquired pneumonia). It is easy for this kind to spread from person to person. It can spread to you if you breathe near an infected person who coughs or sneezes. Some symptoms include:  A dry cough.  A wet (productive) cough.  Fever.  Sweating.  Chest pain.  Follow these instructions at home:  Take over-the-counter and prescription medicines only as told by your doctor. ? Only take cough medicine if you are losing sleep. ? If you were prescribed an antibiotic medicine, take it as told by your doctor. Do not stop taking the antibiotic even if you start to feel better.  Sleep with your head and neck raised (elevated). You can do this by putting a few pillows under your head, or you can sleep in a recliner.  Do not use tobacco products. These include cigarettes, chewing tobacco, and e-cigarettes. If you need help quitting, ask your doctor.  Drink enough water to keep your pee (urine) clear or pale yellow. A shot (vaccine) can help prevent pneumonia. Shots are often suggested for:  People older than 73 years of age.  People older than 73 years of age: ? Who are having cancer treatment. ? Who have long-term (chronic) lung disease. ? Who have problems with their body's defense system (immune system).  You may also prevent pneumonia if you take these actions:  Get the flu (influenza) shot every year.  Go to the dentist as often as told.  Wash your hands often. If soap and water are not available, use hand sanitizer.  Contact a doctor if:  You have a fever.  You lose sleep because your  cough medicine does not help. Get help right away if:  You are short of breath and it gets worse.  You have more chest pain.  Your sickness gets worse. This is very serious if: ? You are an older adult. ? Your body's defense system is weak.  You cough up blood. This information is not intended to replace advice given to you by your health care provider. Make sure you discuss any questions you have with your health care provider. Document Released: 07/19/2007 Document Revised: 07/08/2015 Document Reviewed: 05/27/2014 Elsevier Interactive Patient Education  [employer].   I recommend that you only use cold medications that are safe in high blood pressure like Coricidin (generic is fine).  Other cold medications can increase your blood pressure.    - Get plenty of rest and drink plenty of fluids. - Try to breathe moist air. Use a cold mist humidifier. - Consume warm fluids (soup or tea) to provide relief for a stuffy nose and to loosen phlegm. - For nasal stuffiness, try saline nasal spray or a Neti Pot.  Afrin nasal spray can also be used but this product should not be used longer than 3 days or it will cause rebound nasal stuffiness (worsening nasal congestion). - For sore throat pain relief: suck on throat lozenges, hard candy or popsicles; gargle with warm salt water (1/4 tsp. salt per 8 oz. of water); and eat soft, bland foods. - Eat a well-balanced diet. If  you cannot, ensure you are getting enough nutrients by taking a daily multivitamin. - Avoid dairy products, as they can thicken phlegm. - Avoid alcohol, as it impairs your body's immune system.  CONTACT YOUR DOCTOR IF YOU EXPERIENCE ANY OF THE FOLLOWING: - High fever - Ear pain - Sinus-type headache - Unusually severe cold symptoms - Cough that gets worse while other cold symptoms improve - Flare up of any chronic lung problem, such as asthma - Your symptoms persist longer than 2 weeks

## 2017-07-21 ENCOUNTER — Other Ambulatory Visit: Payer: Self-pay | Admitting: Nurse Practitioner

## 2017-07-21 DIAGNOSIS — F329 Major depressive disorder, single episode, unspecified: Secondary | ICD-10-CM

## 2017-07-21 DIAGNOSIS — F32A Depression, unspecified: Secondary | ICD-10-CM

## 2017-07-23 DIAGNOSIS — D225 Melanocytic nevi of trunk: Secondary | ICD-10-CM | POA: Diagnosis not present

## 2017-07-23 DIAGNOSIS — L814 Other melanin hyperpigmentation: Secondary | ICD-10-CM | POA: Diagnosis not present

## 2017-07-23 DIAGNOSIS — L570 Actinic keratosis: Secondary | ICD-10-CM | POA: Diagnosis not present

## 2017-07-23 DIAGNOSIS — D485 Neoplasm of uncertain behavior of skin: Secondary | ICD-10-CM | POA: Diagnosis not present

## 2017-07-23 DIAGNOSIS — D1801 Hemangioma of skin and subcutaneous tissue: Secondary | ICD-10-CM | POA: Diagnosis not present

## 2017-07-23 DIAGNOSIS — L821 Other seborrheic keratosis: Secondary | ICD-10-CM | POA: Diagnosis not present

## 2017-07-23 DIAGNOSIS — C4442 Squamous cell carcinoma of skin of scalp and neck: Secondary | ICD-10-CM | POA: Diagnosis not present

## 2017-08-02 NOTE — Telephone Encounter (Signed)
Error in charting.

## 2017-10-03 DIAGNOSIS — L905 Scar conditions and fibrosis of skin: Secondary | ICD-10-CM | POA: Diagnosis not present

## 2017-10-03 DIAGNOSIS — D044 Carcinoma in situ of skin of scalp and neck: Secondary | ICD-10-CM | POA: Diagnosis not present

## 2017-10-19 ENCOUNTER — Other Ambulatory Visit: Payer: Self-pay | Admitting: Pharmacist

## 2017-10-19 NOTE — Patient Outreach (Signed)
[facility]) Care Management  10/19/2017  REQUAN HARDGE January 26, 1946 [id number]   Incoming call from Merlene Morse in response to the [facility] Medication Adherence Campaign. Speak with patient. HIPAA identifiers verified and verbal consent received.  Mr. Saladin reports that he takes his atorvastatin 20 mg daily as directed. Denies missed doses or barriers to adherence. Reports that he uses a weekly pillbox to organize his medications. Counsel patient on the importance of adherence to this medication.  Patient denies any medication questions/concerns at this time.  Will close pharmacy episode.  Harlow Asa, PharmD, [facility] [phone]

## 2017-11-26 ENCOUNTER — Ambulatory Visit (INDEPENDENT_AMBULATORY_CARE_PROVIDER_SITE_OTHER): Payer: PPO

## 2017-11-26 DIAGNOSIS — Z23 Encounter for immunization: Secondary | ICD-10-CM | POA: Diagnosis not present

## 2017-12-24 NOTE — Progress Notes (Signed)
HPI The patient presents with a one year followup. Since I last saw him he has done well.  He remains active and owns 14 lawnmowers.  He has been blowing leaves.  He said a little pain in his deltoid on his left arm but he thinks this is related to the motion of using a leaf blower. The patient denies any new symptoms such as chest discomfort, neck or arm discomfort. There has been no new shortness of breath, PND or orthopnea. There have been no reported palpitations, presyncope or syncope.  No Known Allergies  Current Outpatient Medications  Medication Sig Dispense Refill  . aspirin EC 81 MG tablet Take 81 mg by mouth every morning.      Marland Kitchen atorvastatin (LIPITOR) 20 MG tablet Take 1 tablet (20 mg total) by mouth daily. 90 tablet 2  . Cholecalciferol (VITAMIN D) 2000 UNITS CAPS Take 1 capsule by mouth daily.      Marland Kitchen escitalopram (LEXAPRO) 20 MG tablet Take 1 tablet (20 mg total) by mouth daily. 90 tablet 2  . LORazepam (ATIVAN) 0.5 MG tablet Take 0.5 mg by mouth as needed for anxiety. Reported on 04/28/2015    . Multiple Vitamins-Minerals (MULTIVITAMIN WITH MINERALS) tablet Take 1 tablet by mouth daily.      . nitroGLYCERIN (NITROSTAT) 0.4 MG SL tablet Place 0.4 mg under the tongue every 5 (five) minutes as needed. Reported on 04/01/2015     No current facility-administered medications for this visit.     Past Medical History:  Diagnosis Date  . Carotid stenosis   . Coronary artery disease    s/p Taxus stenting to the LAD.  This was 2005. Catheterization in 2008, demonstrated a proximal calcification with ostial 40-50% stenosis, it was a widely patent stent, the circumflex had 40%  proximal stenosis,  the right coronary artery is large and dominant with minor nonobstructive plaque, the EF was 55%.  . Degenerative joint disease   . Depression with anxiety   . Dyslipidemia   . Sinus bradycardia     Past Surgical History:  Procedure Laterality Date  . ORTHOPEDIC SURGERY     right  foot and right hip secondary to DJD    ROS: As stated in the HPI and negative for all other systems.  PHYSICAL EXAM BP 124/70   Pulse 61   Ht 5\' 11"  (1.803 m)   Wt 225 lb (102.1 kg)   BMI 31.38 kg/m   GENERAL:  Well appearing NECK:  No jugular venous distention, waveform within normal limits, carotid upstroke brisk and symmetric, no bruits, no thyromegaly LUNGS:  Clear to auscultation bilaterally CHEST:  Unremarkable HEART:  PMI not displaced or sustained,S1 and S2 within normal limits, no S3, no S4, no clicks, no rubs, no murmurs ABD:  Flat, positive bowel sounds normal in frequency in pitch, no bruits, no rebound, no guarding, no midline pulsatile mass, no hepatomegaly, no splenomegaly EXT:  2 plus pulses throughout, no edema, no cyanosis no clubbing   Lab Results  Component Value Date   CHOL 143 09/29/2015   TRIG 78 09/29/2015   HDL 48 09/29/2015   LDLCALC 79 09/29/2015   LDLDIRECT 67.0 06/01/2006    EKG:  Sinus rhythm, rate 61, axis within normal limits, intervals within normal limits, no acute ST-T wave changes.  12/26/2017   ASSESSMENT AND PLAN  CAD:  The patient has no new sypmtoms.  No further cardiovascular testing is indicated.  We will continue with aggressive risk reduction  and meds as listed.  CAROTID STENOSIS:   No further testing is indicated.   DYSLIPIDEMIA:    He needs a fasting lipid profile. I will arrange this.

## 2017-12-26 ENCOUNTER — Ambulatory Visit (INDEPENDENT_AMBULATORY_CARE_PROVIDER_SITE_OTHER): Payer: PPO | Admitting: Cardiology

## 2017-12-26 ENCOUNTER — Encounter: Payer: Self-pay | Admitting: Cardiology

## 2017-12-26 VITALS — BP 124/70 | HR 61 | Ht 71.0 in | Wt 225.0 lb

## 2017-12-26 DIAGNOSIS — I2510 Atherosclerotic heart disease of native coronary artery without angina pectoris: Secondary | ICD-10-CM

## 2017-12-26 DIAGNOSIS — Z79899 Other long term (current) drug therapy: Secondary | ICD-10-CM | POA: Diagnosis not present

## 2017-12-26 DIAGNOSIS — E785 Hyperlipidemia, unspecified: Secondary | ICD-10-CM

## 2017-12-26 NOTE — Patient Instructions (Addendum)
Medication Instructions:  The current medical regimen is effective;  continue present plan and medications.  If you need a refill on your cardiac medications before your next appointment, please call your pharmacy.   Lab work: Please have fasting lab work at [facility] (Lipid/hepatic panel)  If you have labs (blood work) drawn today and your tests are completely normal, you will receive your results only by: Marland Kitchen MyChart Message (if you have MyChart) OR . A paper copy in the mail If you have any lab test that is abnormal or we need to change your treatment, we will call you to review the results.  Follow-Up: Follow up in 1 year with Dr. Percival Spanish in [place].  You will receive a letter in the mail 2 months before you are due.  Please call us when you receive this letter to schedule your follow up appointment.  Thank you for choosing [facility]!!

## 2018-01-01 ENCOUNTER — Other Ambulatory Visit: Payer: PPO

## 2018-01-01 DIAGNOSIS — E785 Hyperlipidemia, unspecified: Secondary | ICD-10-CM

## 2018-01-01 DIAGNOSIS — I2510 Atherosclerotic heart disease of native coronary artery without angina pectoris: Secondary | ICD-10-CM

## 2018-01-01 DIAGNOSIS — Z79899 Other long term (current) drug therapy: Secondary | ICD-10-CM | POA: Diagnosis not present

## 2018-01-02 LAB — LIPID PANEL
Chol/HDL Ratio: 3 ratio (ref 0.0–5.0)
Cholesterol, Total: 133 mg/dL (ref 100–199)
HDL: 44 mg/dL (ref >39)
LDL CALC: 74 mg/dL (ref 0–99)
TRIGLYCERIDES: 77 mg/dL (ref 0–149)
VLDL CHOLESTEROL CAL: 15 mg/dL (ref 5–40)

## 2018-01-02 LAB — HEPATIC FUNCTION PANEL
ALT: 27 IU/L (ref 0–44)
AST: 30 IU/L (ref 0–40)
Albumin: 4.4 g/dL (ref 3.5–4.8)
Alkaline Phosphatase: 67 IU/L (ref 39–117)
Bilirubin Total: 0.3 mg/dL (ref 0.0–1.2)
Bilirubin, Direct: 0.12 mg/dL (ref 0.00–0.40)
Total Protein: 6.6 g/dL (ref 6.0–8.5)

## 2018-03-04 ENCOUNTER — Ambulatory Visit (INDEPENDENT_AMBULATORY_CARE_PROVIDER_SITE_OTHER): Payer: PPO | Admitting: Physician Assistant

## 2018-03-04 ENCOUNTER — Encounter: Payer: Self-pay | Admitting: Physician Assistant

## 2018-03-04 VITALS — BP 131/72 | HR 65 | Temp 98.7°F | Ht 71.0 in | Wt 229.0 lb

## 2018-03-04 DIAGNOSIS — J0110 Acute frontal sinusitis, unspecified: Secondary | ICD-10-CM

## 2018-03-04 MED ORDER — AMOXICILLIN 500 MG PO CAPS: 500.0000 mg | ORAL_CAPSULE | Freq: Three times a day (TID) | ORAL | 0 refills | Status: DC

## 2018-03-04 NOTE — Progress Notes (Signed)
BP 131/72   Pulse 65   Temp 98.7 F (37.1 C) (Oral)   Ht 5\' 11"  (1.803 m)   Wt 229 lb (103.9 kg)   BMI 31.94 kg/m    Subjective:    Patient ID: [name], male    DOB: 1945/12/30, 73 y.o.   MRN: [id number]  HPI: [name] is a 73 y.o. male presenting on 03/04/2018 for Headache (x 2 weeks ) and Sinusitis  This patient has had many days of sinus headache and postnasal drainage. There is copious drainage at times. Denies any fever at this time. There has been a history of sinus infections in the past.  No history of sinus surgery. There is cough at night. It has become more prevalent in recent days.   Past Medical History:  Diagnosis Date  . Carotid stenosis   . Coronary artery disease    s/p Taxus stenting to the LAD.  This was 2005. Catheterization in 2008, demonstrated a proximal calcification with ostial 40-50% stenosis, it was a widely patent stent, the circumflex had 40%  proximal stenosis,  the right coronary artery is large and dominant with minor nonobstructive plaque, the EF was 55%.  . Degenerative joint disease   . Depression with anxiety   . Dyslipidemia   . Sinus bradycardia    Relevant past medical, surgical, family and social history reviewed and updated as indicated. Interim medical history since our last visit reviewed. Allergies and medications reviewed and updated. DATA REVIEWED: CHART IN EPIC  Family History reviewed for pertinent findings.  Review of Systems  Constitutional: Positive for fatigue. Negative for appetite change.  HENT: Positive for sinus pressure and sore throat.   Eyes: Negative.  Negative for pain and visual disturbance.  Respiratory: Positive for shortness of breath and wheezing. Negative for cough and chest tightness.   Cardiovascular: Negative.  Negative for chest pain, palpitations and leg swelling.  Gastrointestinal: Negative.  Negative for abdominal pain, diarrhea, nausea and vomiting.  Endocrine: Negative.     Genitourinary: Negative.   Musculoskeletal: Positive for back pain and myalgias.  Skin: Negative.  Negative for color change and rash.  Neurological: Positive for headaches. Negative for weakness and numbness.  Psychiatric/Behavioral: Negative.     Allergies as of 03/04/2018   No Known Allergies     Medication List       Accurate as of March 04, 2018  3:59 PM. Always use your most recent med list.        amoxicillin 500 MG capsule Commonly known as:  AMOXIL Take 1 capsule (500 mg total) by mouth 3 (three) times daily.   aspirin EC 81 MG tablet Take 81 mg by mouth every morning.   atorvastatin 20 MG tablet Commonly known as:  LIPITOR Take 1 tablet (20 mg total) by mouth daily.   escitalopram 20 MG tablet Commonly known as:  LEXAPRO Take 1 tablet (20 mg total) by mouth daily.   LORazepam 0.5 MG tablet Commonly known as:  ATIVAN Take 0.5 mg by mouth as needed for anxiety. Reported on 04/28/2015   multivitamin with minerals tablet Take 1 tablet by mouth daily.   nitroGLYCERIN 0.4 MG SL tablet Commonly known as:  NITROSTAT Place 0.4 mg under the tongue every 5 (five) minutes as needed. Reported on 04/01/2015   Vitamin D 50 MCG (2000 UT) Caps Take 1 capsule by mouth daily.          Objective:    BP 131/72   Pulse  65   Temp 98.7 F (37.1 C) (Oral)   Ht 5\' 11"  (1.803 m)   Wt 229 lb (103.9 kg)   BMI 31.94 kg/m   No Known Allergies  Wt Readings from Last 3 Encounters:  03/04/18 229 lb (103.9 kg)  12/26/17 225 lb (102.1 kg)  05/07/17 227 lb (103 kg)    Physical Exam Constitutional:      Appearance: He is well-developed.  HENT:     Head: Normocephalic and atraumatic.     Right Ear: Tympanic membrane and external ear normal. No middle ear effusion.     Left Ear: Tympanic membrane and external ear normal.  No middle ear effusion.     Nose: Mucosal edema and rhinorrhea present.     Right Sinus: No maxillary sinus tenderness.     Left Sinus: No maxillary  sinus tenderness.     Mouth/Throat:     Pharynx: Uvula midline. Posterior oropharyngeal erythema present.  Eyes:     General:        Right eye: No discharge.        Left eye: No discharge.     Conjunctiva/sclera: Conjunctivae normal.     Pupils: Pupils are equal, round, and reactive to light.  Neck:     Musculoskeletal: Normal range of motion.  Cardiovascular:     Rate and Rhythm: Normal rate and regular rhythm.     Heart sounds: Normal heart sounds.  Pulmonary:     Effort: Pulmonary effort is normal. No respiratory distress.     Breath sounds: Normal breath sounds. No wheezing.  Abdominal:     Palpations: Abdomen is soft.  Lymphadenopathy:     Cervical: No cervical adenopathy.  Skin:    General: Skin is warm and dry.  Neurological:     Mental Status: He is alert and oriented to person, place, and time.     Results for orders placed or performed in visit on 01/01/18  Lipid panel  Result Value Ref Range   Cholesterol, Total 133 100 - 199 mg/dL   Triglycerides 77 0 - 149 mg/dL   HDL 44 >39 mg/dL   VLDL Cholesterol Cal 15 5 - 40 mg/dL   LDL Calculated 74 0 - 99 mg/dL   Chol/HDL Ratio 3.0 0.0 - 5.0 ratio  Hepatic function panel  Result Value Ref Range   Total Protein 6.6 6.0 - 8.5 g/dL   Albumin 4.4 3.5 - 4.8 g/dL   Bilirubin Total 0.3 0.0 - 1.2 mg/dL   Bilirubin, Direct 0.12 0.00 - 0.40 mg/dL   Alkaline Phosphatase 67 39 - 117 IU/L   AST 30 0 - 40 IU/L   ALT 27 0 - 44 IU/L      Assessment & Plan:   1. Acute non-recurrent frontal sinusitis - amoxicillin (AMOXIL) 500 MG capsule; Take 1 capsule (500 mg total) by mouth 3 (three) times daily.  Dispense: 30 capsule; Refill: 0   Continue all other maintenance medications as listed above.  Follow up plan: No follow-ups on file.  Educational handout given for Terrell Hills PA-C [place] [address]  [place], [place] [address] [phone]   03/04/2018, 3:59 PM

## 2018-04-17 ENCOUNTER — Ambulatory Visit: Payer: PPO

## 2018-04-21 ENCOUNTER — Other Ambulatory Visit: Payer: Self-pay | Admitting: Pediatrics

## 2018-04-21 DIAGNOSIS — F419 Anxiety disorder, unspecified: Secondary | ICD-10-CM

## 2018-04-22 ENCOUNTER — Ambulatory Visit (INDEPENDENT_AMBULATORY_CARE_PROVIDER_SITE_OTHER): Payer: PPO | Admitting: *Deleted

## 2018-04-22 VITALS — BP 130/71 | HR 67 | Ht 71.0 in | Wt 235.2 lb

## 2018-04-22 DIAGNOSIS — Z0000 Encounter for general adult medical examination without abnormal findings: Secondary | ICD-10-CM

## 2018-04-22 MED ORDER — FLUTICASONE PROPIONATE 50 MCG/ACT NA SUSP: 2.0000 | Freq: Every day | NASAL | 6 refills | Status: DC

## 2018-04-22 NOTE — Patient Instructions (Addendum)
Shingrix Vaccine  [name] , Thank you for taking time to come for your Medicare Wellness Visit. I appreciate your ongoing commitment to your health goals. Please review the following plan we discussed and let me know if I can assist you in the future.   These are the goals we discussed: Goals    . Exercise 3x per week (30 min per time)     Try to exercise for at least 30 minutes, 3 times weekly Walking Stationary Bike Hand Weights       This is a list of the screening recommended for you and due dates:  Health Maintenance  Topic Date Due  . Colon Cancer Screening  02/18/2015  . Stool Blood Test  05/01/2018  . Tetanus Vaccine  01/26/2021  . Flu Shot  Completed  .  Hepatitis C: One time screening is recommended by Center for Disease Control  (CDC) for  adults born from [birth year] through 1966.   Completed  . Pneumonia vaccines  Completed     Advance Directive  Advance directives are legal documents that let you make choices ahead of time about your health care and medical treatment in case you become unable to communicate for yourself. Advance directives are a way for you to communicate your wishes to family, friends, and health care providers. This can help convey your decisions about end-of-life care if you become unable to communicate. Discussing and writing advance directives should happen over time rather than all at once. Advance directives can be changed depending on your situation and what you want, even after you have signed the advance directives. If you do not have an advance directive, some states assign family decision makers to act on your behalf based on how closely you are related to them. Each state has its own laws regarding advance directives. You may want to check with your health care provider, attorney, or state representative about the laws in your state. There are different types of advance directives, such as:  Medical power of attorney.  Living will.  Do not  resuscitate (DNR) or do not attempt resuscitation (DNAR) order. Health care proxy and medical power of attorney A health care proxy, also called a health care agent, is a person who is appointed to make medical decisions for you in cases in which you are unable to make the decisions yourself. Generally, people choose someone they know well and trust to represent their preferences. Make sure to ask this person for an agreement to act as your proxy. A proxy may have to exercise judgment in the event of a medical decision for which your wishes are not known. A medical power of attorney is a legal document that names your health care proxy. Depending on the laws in your state, after the document is written, it may also need to be:  Signed.  Notarized.  Dated.  Copied.  Witnessed.  Incorporated into your medical record. You may also want to appoint someone to manage your financial affairs in a situation in which you are unable to do so. This is called a durable power of attorney for finances. It is a separate legal document from the durable power of attorney for health care. You may choose the same person or someone different from your health care proxy to act as your agent in financial matters. If you do not appoint a proxy, or if there is a concern that the proxy is not acting in your best interests, a court-appointed guardian may  be designated to act on your behalf. Living will A living will is a set of instructions documenting your wishes about medical care when you cannot express them yourself. Health care providers should keep a copy of your living will in your medical record. You may want to give a copy to family members or friends. To alert caregivers in case of an emergency, you can place a card in your wallet to let them know that you have a living will and where they can find it. A living will is used if you become:  Terminally ill.  Incapacitated.  Unable to communicate or make  decisions. Items to consider in your living will include:  The use or non-use of life-sustaining equipment, such as dialysis machines and breathing machines (ventilators).  A DNR or DNAR order, which is the instruction not to use cardiopulmonary resuscitation (CPR) if breathing or heartbeat stops.  The use or non-use of tube feeding.  Withholding of food and fluids.  Comfort (palliative) care when the goal becomes comfort rather than a cure.  Organ and tissue donation. A living will does not give instructions for distributing your money and property if you should pass away. It is recommended that you seek the advice of a lawyer when writing a will. Decisions about taxes, beneficiaries, and asset distribution will be legally binding. This process can relieve your family and friends of any concerns surrounding disputes or questions that may come up about the distribution of your assets. DNR or DNAR A DNR or DNAR order is a request not to have CPR in the event that your heart stops beating or you stop breathing. If a DNR or DNAR order has not been made and shared, a health care provider will try to help any patient whose heart has stopped or who has stopped breathing. If you plan to have surgery, talk with your health care provider about how your DNR or DNAR order will be followed if problems occur. Summary  Advance directives are the legal documents that allow you to make choices ahead of time about your health care and medical treatment in case you become unable to communicate for yourself.  The process of discussing and writing advance directives should happen over time. You can change the advance directives, even after you have signed them.  Advance directives include DNR or DNAR orders, living wills, and designating an agent as your medical power of attorney. This information is not intended to replace advice given to you by your health care provider. Make sure you discuss any questions you  have with your health care provider. Document Released: 05/09/2007 Document Revised: 12/20/2015 Document Reviewed: 12/20/2015 Elsevier Interactive Patient Education  2019 [employer].

## 2018-04-22 NOTE — Progress Notes (Signed)
Subjective:   [name] is a 74 y.o. male who presents for an Initial Medicare Annual Wellness Visit.  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  He enjoys working on Conservation officer, nature and attending [employer].  He lives at home with his daughter(Sherry), grandson [name]) and 2 dogs (Scrappy and [place]).  He has another daughter, son and 2 grandchildren that he also sees often.   [name] gets his exercise from working around the house.  He eats 3 unhealthy meals daily that consist of soup, sandwiches and pizza.  [name] has had no hospitalization or surgeries in the past year.  He has had no fall in the past year and feels that his health is overall the same.    Objective:    Today's Vitals   04/22/18 0909  BP: 130/71  Pulse: 67  Weight: 235 lb 3.2 oz (106.7 kg)  Height: 5\' 11"  (1.803 m)   Body mass index is 32.8 kg/m.  Advanced Directives 04/27/2014  Does Patient Have a Medical Advance Directive? No  Would patient like information on creating a medical advance directive? No - patient declined information  Packet given  Current Medications (verified) Outpatient Encounter Medications as of 04/22/2018  Medication Sig  . aspirin EC 81 MG tablet Take 81 mg by mouth every morning.    Marland Kitchen atorvastatin (LIPITOR) 20 MG tablet Take 1 tablet (20 mg total) by mouth daily.  . Cholecalciferol (VITAMIN D) 2000 UNITS CAPS Take 1 capsule by mouth daily.    Marland Kitchen escitalopram (LEXAPRO) 20 MG tablet Take 1 tablet (20 mg total) by mouth daily.  . Multiple Vitamins-Minerals (MULTIVITAMIN WITH MINERALS) tablet Take 1 tablet by mouth daily.    . nitroGLYCERIN (NITROSTAT) 0.4 MG SL tablet Place 0.4 mg under the tongue every 5 (five) minutes as needed. Reported on 04/01/2015  . fluticasone (FLONASE) 50 MCG/ACT nasal spray Place 2 sprays into both nostrils daily.  Marland Kitchen LORazepam (ATIVAN) 0.5 MG tablet Take 0.5 mg by mouth as needed for anxiety. Reported on 04/28/2015  . [DISCONTINUED] amoxicillin (AMOXIL) 500 MG  capsule Take 1 capsule (500 mg total) by mouth 3 (three) times daily.   No facility-administered encounter medications on file as of 04/22/2018.     Allergies (verified) Patient has no known allergies.   History: Past Medical History:  Diagnosis Date  . Carotid stenosis   . Coronary artery disease    s/p Taxus stenting to the LAD.  This was 2005. Catheterization in 2008, demonstrated a proximal calcification with ostial 40-50% stenosis, it was a widely patent stent, the circumflex had 40%  proximal stenosis,  the right coronary artery is large and dominant with minor nonobstructive plaque, the EF was 55%.  . Degenerative joint disease   . Depression with anxiety   . Dyslipidemia   . Sinus bradycardia    Past Surgical History:  Procedure Laterality Date  . ORTHOPEDIC SURGERY     right foot and right hip secondary to DJD   Family History  Problem Relation Age of Onset  . COPD Mother   . Coronary artery disease Father        stent  . Diabetes Father   . Coronary artery disease Other        s/p MI   Social History   Socioeconomic History  . Marital status: Legally Separated    Spouse name: Not on file  . Number of children: 3  . Years of education: 7th Grade  .  Highest education level: 7th grade  Occupational History  . Occupation: Retired    Comment: Arboriculturist  . Financial resource strain: Not hard at all  . Food insecurity:    Worry: Never true    Inability: Never true  . Transportation needs:    Medical: No    Non-medical: No  Tobacco Use  . Smoking status: Former Smoker    Types: Cigarettes    Last attempt to quit: 02/14/2003    Years since quitting: 15.1  . Smokeless tobacco: Never Used  Substance and Sexual Activity  . Alcohol use: No    Alcohol/week: 0.0 standard drinks    Comment: quit completely in 2005  . Drug use: No  . Sexual activity: Yes  Lifestyle  . Physical activity:    Days per week: 0 days    Minutes per session: 0 min  .  Stress: Only a little  Relationships  . Social connections:    Talks on phone: More than three times a week    Gets together: More than three times a week    Attends religious service: Never    Active member of club or organization: No    Attends meetings of clubs or organizations: Never    Relationship status: Separated  Other Topics Concern  . Not on file  Social History Narrative   Lives at home with Daughter [name]) and Yolanda Bonine [name])   Tobacco Counseling Counseling given: Not Answered   Clinical Intake:  Pre-visit preparation completed: No  Pain : No/denies pain     BMI - recorded: 32.8 Nutritional Status: BMI > 30  Obese Nutritional Risks: None Diabetes: No  How often do you need to have someone help you when you read instructions, pamphlets, or other written materials from your doctor or pharmacy?: 1 - Never What is the last grade level you completed in school?: 7th Grade  Interpreter Needed?: No  Information entered by :: Truett Mainland, LPN  Activities of Daily Living In your present state of health, do you have any difficulty performing the following activities: 04/22/2018  Hearing? Y  Vision? Y  Comment wears glasses  Difficulty concentrating or making decisions? Y  Comment Remembering  Walking or climbing stairs? N  Dressing or bathing? N  Doing errands, shopping? N  Some recent data might be hidden     Immunizations and Health Maintenance Immunization History  Administered Date(s) Administered  . Influenza, High Dose Seasonal PF 01/03/2016, 12/11/2016, 11/26/2017  . Influenza,inj,Quad PF,6+ Mos 12/10/2012, 11/19/2013, 12/09/2014  . Pneumococcal Conjugate-13 10/27/2013  . Pneumococcal Polysaccharide-23 06/09/2015  . Tdap 01/27/2011   Health Maintenance Due  Topic Date Due  . COLONOSCOPY  02/18/2015  colonoscopy ordered   Patient Care Team: Theodoro Clock as PCP - General (Physician Assistant)  Indicate any recent Medical Services  you may have received from other than [facility] providers in the past year (date may be approximate).    Assessment:   This is a routine wellness examination for [facility].  Hearing/Vision screen No exam data present  Dietary issues and exercise activities discussed: Current Exercise Habits: Home exercise routine;The patient does not participate in regular exercise at present  Goals    . Exercise 3x per week (30 min per time)     Try to exercise for at least 30 minutes, 3 times weekly Walking Stationary Bike Hand Weights      Depression Screen [facility] 2/9 Scores 04/22/2018 03/04/2018 04/24/2017 01/24/2017  PHQ - 2 Score  0 0 0 0    Fall Risk Fall Risk  04/22/2018 03/04/2018 04/24/2017 12/11/2016 11/07/2016  Falls in the past year? 0 0 No Yes Yes  Number falls in past yr: - - - 1 1  Injury with Fall? - - - No No  Comment - - - - -    Is the patient's home free of loose throw rugs in walkways, pet beds, electrical cords, etc?   yes      Grab bars in the bathroom? yes      Handrails on the stairs?   yes      Adequate lighting?   yes  Timed Get Up and Go performed:   Cognitive Function: MMSE - Mini Mental State Exam 04/22/2018  Orientation to time 5  Orientation to Place 5  Registration 3  Attention/ Calculation 4  Recall 3  Language- name 2 objects 2  Language- repeat 1  Language- follow 3 step command 3  Language- read & follow direction 1  Write a sentence 1  Copy design 1  Total score 29        Screening Tests Health Maintenance  Topic Date Due  . COLONOSCOPY  02/18/2015  . COLON CANCER SCREENING ANNUAL FOBT  05/01/2018  . TETANUS/TDAP  01/26/2021  . INFLUENZA VACCINE  Completed  . Hepatitis C Screening  Completed  . PNA vac Low Risk Adult  Completed  Colonoscopy ordered   Qualifies for Shingles Vaccine? Will have done at pharmacy   Cancer Screenings: Lung: Low Dose CT Chest recommended if Age 70-80 years, 30 pack-year currently smoking OR have quit w/in 15years.  Patient does not qualify. Colorectal: Ordered  Additional Screenings:  Hepatitis C Screening:       Plan:   Encouraged to try to eat 3 healthy meals with snacks in between that consist of lean proteins, fresh fruits and fresh vegetables.  Encouraged to try to exercise for at least 30 minutes, 3 times weekly. Explained the importance of shingrix vaccine and encouraged to have done at pharmacy due to insurance.  Explained the importance of colonoscopy and ordered. Encouraged to look over and discuss advance directive packet given. Encouraged to keep follow up appointment with Particia Nearing, Pa and Dr. Percival Spanish.   I have personally reviewed and noted the following in the patient's chart:   . Medical and social history . Use of alcohol, tobacco or illicit drugs  . Current medications and supplements . Functional ability and status . Nutritional status . Physical activity . Advanced directives . List of other physicians . Hospitalizations, surgeries, and ER visits in previous 12 months . Vitals . Screenings to include cognitive, depression, and falls . Referrals and appointments  In addition, I have reviewed and discussed with patient certain preventive protocols, quality metrics, and best practice recommendations. A written personalized care plan for preventive services as well as general preventive health recommendations were provided to patient.     Wardell Heath, LPN   09/18/7617

## 2018-05-05 ENCOUNTER — Other Ambulatory Visit: Payer: Self-pay | Admitting: Pediatrics

## 2018-05-05 DIAGNOSIS — F419 Anxiety disorder, unspecified: Secondary | ICD-10-CM

## 2018-05-06 MED ORDER — ESCITALOPRAM OXALATE 20 MG PO TABS: 20.0000 mg | ORAL_TABLET | Freq: Every day | ORAL | 0 refills | Status: DC

## 2018-05-06 NOTE — Addendum Note (Signed)
Addended by: Antonietta Barcelona D on: 05/06/2018 10:16 AM   Modules accepted: Orders

## 2018-05-06 NOTE — Telephone Encounter (Signed)
Refill failed. resent °

## 2018-05-07 ENCOUNTER — Other Ambulatory Visit: Payer: Self-pay | Admitting: Pediatrics

## 2018-05-07 DIAGNOSIS — E785 Hyperlipidemia, unspecified: Secondary | ICD-10-CM

## 2018-05-10 ENCOUNTER — Encounter: Payer: Self-pay | Admitting: Physician Assistant

## 2018-05-10 ENCOUNTER — Other Ambulatory Visit: Payer: Self-pay

## 2018-05-10 ENCOUNTER — Ambulatory Visit (INDEPENDENT_AMBULATORY_CARE_PROVIDER_SITE_OTHER): Payer: PPO | Admitting: Physician Assistant

## 2018-05-10 DIAGNOSIS — E785 Hyperlipidemia, unspecified: Secondary | ICD-10-CM | POA: Diagnosis not present

## 2018-05-10 DIAGNOSIS — Z1211 Encounter for screening for malignant neoplasm of colon: Secondary | ICD-10-CM | POA: Diagnosis not present

## 2018-05-10 DIAGNOSIS — J301 Allergic rhinitis due to pollen: Secondary | ICD-10-CM | POA: Diagnosis not present

## 2018-05-10 DIAGNOSIS — F419 Anxiety disorder, unspecified: Secondary | ICD-10-CM | POA: Insufficient documentation

## 2018-05-10 MED ORDER — ESCITALOPRAM OXALATE 20 MG PO TABS: 20.0000 mg | ORAL_TABLET | Freq: Every day | ORAL | 3 refills | Status: DC

## 2018-05-10 MED ORDER — LORATADINE 10 MG PO TABS: 10.0000 mg | ORAL_TABLET | Freq: Every day | ORAL | 3 refills | Status: DC

## 2018-05-10 NOTE — Progress Notes (Signed)
Telephone visit  Subjective: CC: Recheck hyperlipidemia, anxiety, allergic rhinitis. PCP: Terald Sleeper, PA-C [facility] H [name] is a 73 y.o. male calls for telephone consult today. Patient provides verbal consent for consult held via phone.  Location of patient: home Location of provider: [facility] Others present for call: no  1.  Hyperlipidemia Atorvastatin 20 mg 1 daily He is tolerating the medications well and not having any issues.  We will plan to do labs later this year.  2. Generalized anxiety Celexa 20 mg 1 daily Ativan taken on a rare occasion He reports having a great improvement in his overall anxiety since going from 10 to 20 mg at his last visit.  He denies any side effects with the medications.  He has great energy and is being very active.   3.  Allergic rhinitis His symptoms are greatly flared up at this time because of seasonal pollen.  He is taking Claritin over-the-counter but not on a regular basis.  I encouraged him to take it once daily through the spring and then he can discontinue.  The patient is doing a colonoscopy.  It has been many years since he has had one.  He did have polyps on his previous exam.  So we will set up for a future colonoscopy to be performed.  ROS: Per HPI  No Known Allergies Past Medical History:  Diagnosis Date  . Carotid stenosis   . Coronary artery disease    s/p Taxus stenting to the LAD.  This was 2005. Catheterization in 2008, demonstrated a proximal calcification with ostial 40-50% stenosis, it was a widely patent stent, the circumflex had 40%  proximal stenosis,  the right coronary artery is large and dominant with minor nonobstructive plaque, the EF was 55%.  . Degenerative joint disease   . Depression with anxiety   . Dyslipidemia   . Sinus bradycardia     Current Outpatient Medications:  .  aspirin EC 81 MG tablet, Take 81 mg by mouth every morning.  , Disp: , Rfl:  .  atorvastatin (LIPITOR) 20 MG tablet, TAKE 1 TABLET BY  MOUTH EVERY DAY, Disp: 90 tablet, Rfl: 2 .  Cholecalciferol (VITAMIN D) 2000 UNITS CAPS, Take 1 capsule by mouth daily.  , Disp: , Rfl:  .  escitalopram (LEXAPRO) 20 MG tablet, Take 1 tablet (20 mg total) by mouth daily., Disp: 90 tablet, Rfl: 3 .  fluticasone (FLONASE) 50 MCG/ACT nasal spray, Place 2 sprays into both nostrils daily., Disp: 16 g, Rfl: 6 .  loratadine (CLARITIN) 10 MG tablet, Take 1 tablet (10 mg total) by mouth daily., Disp: 90 tablet, Rfl: 3 .  LORazepam (ATIVAN) 0.5 MG tablet, Take 0.5 mg by mouth as needed for anxiety. Reported on 04/28/2015, Disp: , Rfl:  .  Multiple Vitamins-Minerals (MULTIVITAMIN WITH MINERALS) tablet, Take 1 tablet by mouth daily.  , Disp: , Rfl:  .  nitroGLYCERIN (NITROSTAT) 0.4 MG SL tablet, Place 0.4 mg under the tongue every 5 (five) minutes as needed. Reported on 04/01/2015, Disp: , Rfl:   Assessment/ Plan: 73 y.o. male   1. Anxiety - escitalopram (LEXAPRO) 20 MG tablet; Take 1 tablet (20 mg total) by mouth daily.  Dispense: 90 tablet; Refill: 3 Ativan is taken on a rare occasion  2. Hyperlipidemia with target LDL less than 100 Atorvastatin 20 mg 1 daily  3. Seasonal allergic rhinitis due to pollen - loratadine (CLARITIN) 10 MG tablet; Take 1 tablet (10 mg total) by mouth daily.  Dispense: 90  tablet; Refill: 3   Start time: 9:49 End time: 10:00  Meds ordered this encounter  Medications  . escitalopram (LEXAPRO) 20 MG tablet    Sig: Take 1 tablet (20 mg total) by mouth daily.    Dispense:  90 tablet    Refill:  3    Order Specific Question:   Supervising Provider    Answer:   Janora Norlander [[id number]]  . loratadine (CLARITIN) 10 MG tablet    Sig: Take 1 tablet (10 mg total) by mouth daily.    Dispense:  90 tablet    Refill:  3    Order Specific Question:   Supervising Provider    Answer:   Janora Norlander [[id number]]    Particia Nearing PA-C [place] [phone]

## 2018-07-02 NOTE — Addendum Note (Signed)
Addended by: Wardell Heath on: 07/02/2018 10:01 AM   Modules accepted: Level of Service

## 2018-07-19 ENCOUNTER — Other Ambulatory Visit: Payer: Self-pay

## 2018-07-19 ENCOUNTER — Ambulatory Visit (INDEPENDENT_AMBULATORY_CARE_PROVIDER_SITE_OTHER): Payer: PPO | Admitting: Family Medicine

## 2018-07-19 ENCOUNTER — Encounter: Payer: Self-pay | Admitting: Family Medicine

## 2018-07-19 VITALS — BP 148/75 | HR 63 | Temp 97.0°F | Ht 71.0 in | Wt 234.8 lb

## 2018-07-19 DIAGNOSIS — S39012A Strain of muscle, fascia and tendon of lower back, initial encounter: Secondary | ICD-10-CM

## 2018-07-19 MED ORDER — METHYLPREDNISOLONE ACETATE 80 MG/ML IJ SUSP
80.0000 mg | Freq: Once | INTRAMUSCULAR | Status: AC
  Administered 2018-07-19: 80 mg via INTRAMUSCULAR

## 2018-07-19 NOTE — Progress Notes (Signed)
BP (!) 148/75   Pulse 63   Temp (!) 97 F (36.1 C) (Oral)   Ht 5\' 11"  (1.803 m)   Wt 234 lb 12.8 oz (106.5 kg)   BMI 32.75 kg/m    Subjective:   Patient ID: [name], male    DOB: April 21, 1945, 73 y.o.   MRN: [id number]  HPI: [name] is a 73 y.o. male presenting on 07/19/2018 for Back Pain (Patient states he picked up something 3-4 days ago and now he is having right sided lower back pain that is going down right leg)   HPI Patient is complaining of bilateral low back pain that is been going on for 3 or 4 days.  He says that it goes down towards his right leg.  He says he was bending down to pick up something and when he came back up that is when he felt the pain in his lower back.  Patient denies any numbness or weakness but just has the pain shooting down the back of his right leg.  He says it has been especially worse at night.  Relevant past medical, surgical, family and social history reviewed and updated as indicated. Interim medical history since our last visit reviewed. Allergies and medications reviewed and updated.  Review of Systems  Constitutional: Negative for chills and fever.  Respiratory: Negative for shortness of breath and wheezing.   Cardiovascular: Negative for chest pain and leg swelling.  Musculoskeletal: Positive for back pain and myalgias. Negative for gait problem.  Skin: Negative for rash.  All other systems reviewed and are negative.   Per HPI unless specifically indicated above   Allergies as of 07/19/2018   No Known Allergies     Medication List       Accurate as of July 19, 2018 12:09 PM. If you have any questions, ask your nurse or doctor.        aspirin EC 81 MG tablet Take 81 mg by mouth every morning.   atorvastatin 20 MG tablet Commonly known as:  LIPITOR TAKE 1 TABLET BY MOUTH EVERY DAY   escitalopram 20 MG tablet Commonly known as:  LEXAPRO Take 1 tablet (20 mg total) by mouth daily.   fluticasone 50 MCG/ACT nasal  spray Commonly known as:  FLONASE Place 2 sprays into both nostrils daily.   loratadine 10 MG tablet Commonly known as:  Claritin Take 1 tablet (10 mg total) by mouth daily.   LORazepam 0.5 MG tablet Commonly known as:  ATIVAN Take 0.5 mg by mouth as needed for anxiety. Reported on 04/28/2015   multivitamin with minerals tablet Take 1 tablet by mouth daily.   nitroGLYCERIN 0.4 MG SL tablet Commonly known as:  NITROSTAT Place 0.4 mg under the tongue every 5 (five) minutes as needed. Reported on 04/01/2015   Vitamin D 50 MCG (2000 UT) Caps Take 1 capsule by mouth daily.        Objective:   BP (!) 148/75   Pulse 63   Temp (!) 97 F (36.1 C) (Oral)   Ht 5\' 11"  (1.803 m)   Wt 234 lb 12.8 oz (106.5 kg)   BMI 32.75 kg/m   Wt Readings from Last 3 Encounters:  07/19/18 234 lb 12.8 oz (106.5 kg)  04/22/18 235 lb 3.2 oz (106.7 kg)  03/04/18 229 lb (103.9 kg)    Physical Exam Vitals signs and nursing note reviewed.  Constitutional:      General: He is not in acute distress.  Appearance: He is well-developed. He is not diaphoretic.  Eyes:     General: No scleral icterus.    Conjunctiva/sclera: Conjunctivae normal.  Neck:     Thyroid: No thyromegaly.  Musculoskeletal: Normal range of motion.     Lumbar back: He exhibits tenderness (Bilateral lumbar tenderness in the musculature and paraspinal.). He exhibits normal range of motion.  Skin:    General: Skin is warm and dry.     Findings: No rash.  Neurological:     Mental Status: He is alert and oriented to person, place, and time.     Coordination: Coordination normal.  Psychiatric:        Behavior: Behavior normal.      Assessment & Plan:   Problem List Items Addressed This Visit    None    Visit Diagnoses    Strain of lumbar region, initial encounter    -  Primary   Right lower back strain with sciatic, will do steroid and recommend anti-inflammatories   Relevant Medications   methylPREDNISolone acetate  (DEPO-MEDROL) injection 80 mg (Completed)      Recommended stretching and heating pad and continue his Biofreeze as well.  Follow up plan: Return if symptoms worsen or fail to improve.  Counseling provided for all of the vaccine components No orders of the defined types were placed in this encounter.   Caryl Pina, MD [facility] 07/19/2018, 12:09 PM

## 2018-08-13 ENCOUNTER — Other Ambulatory Visit: Payer: Self-pay

## 2018-08-13 ENCOUNTER — Ambulatory Visit: Payer: PPO | Admitting: *Deleted

## 2018-08-13 VITALS — Ht 71.0 in | Wt 235.0 lb

## 2018-08-13 DIAGNOSIS — Z1211 Encounter for screening for malignant neoplasm of colon: Secondary | ICD-10-CM

## 2018-08-13 MED ORDER — PEG 3350-KCL-NA BICARB-NACL 420 G PO SOLR: 4000.0000 mL | Freq: Once | ORAL | 0 refills | Status: AC

## 2018-08-13 NOTE — Progress Notes (Signed)
Patient's pre-visit was done today over the phone with the patient due to COVID-19 pandemic. Name,DOB and address verified. Insurance verified. Packet of Prep instructions mailed to patient including copy of a consent form and pre-procedure patient acknowledgement form-pt is aware. Patient understands to call us back with any questions or concerns. Patient denies any allergies to eggs or soy. Patient denies any problems with anesthesia/sedation. Patient denies any oxygen use at home. Patient denies taking any diet/weight loss medications or blood thinners. Pt is aware that care partner will wait in the car in the parking lot; if they feel like they will be too hot to wait in the car; they may wait in the lobby.  We want them to wear a mask (we do not have any that we can provide them), practice social distancing, and we will check their temperatures when they get here.  I did remind patient that their care partner needs to stay in the parking lot the entire time. Pt will wear mask into building

## 2018-08-23 ENCOUNTER — Telehealth: Payer: Self-pay | Admitting: Gastroenterology

## 2018-08-23 NOTE — Telephone Encounter (Signed)

## 2018-08-26 ENCOUNTER — Other Ambulatory Visit: Payer: Self-pay

## 2018-08-26 ENCOUNTER — Encounter: Payer: Self-pay | Admitting: Gastroenterology

## 2018-08-26 ENCOUNTER — Ambulatory Visit (AMBULATORY_SURGERY_CENTER): Payer: PPO | Admitting: Gastroenterology

## 2018-08-26 VITALS — BP 132/82 | HR 55 | Temp 97.8°F | Resp 17 | Ht 71.0 in | Wt 235.0 lb

## 2018-08-26 DIAGNOSIS — D120 Benign neoplasm of cecum: Secondary | ICD-10-CM

## 2018-08-26 DIAGNOSIS — D128 Benign neoplasm of rectum: Secondary | ICD-10-CM | POA: Diagnosis not present

## 2018-08-26 DIAGNOSIS — D125 Benign neoplasm of sigmoid colon: Secondary | ICD-10-CM

## 2018-08-26 DIAGNOSIS — Z1211 Encounter for screening for malignant neoplasm of colon: Secondary | ICD-10-CM | POA: Diagnosis not present

## 2018-08-26 DIAGNOSIS — D127 Benign neoplasm of rectosigmoid junction: Secondary | ICD-10-CM | POA: Diagnosis not present

## 2018-08-26 DIAGNOSIS — D123 Benign neoplasm of transverse colon: Secondary | ICD-10-CM

## 2018-08-26 MED ORDER — SODIUM CHLORIDE 0.9 % IV SOLN: 500.0000 mL | Freq: Once | INTRAVENOUS | Status: DC

## 2018-08-26 NOTE — Progress Notes (Signed)
Called to room to assist during endoscopic procedure.  Patient ID and intended procedure confirmed with present staff. Received instructions for my participation in the procedure from the performing physician.  

## 2018-08-26 NOTE — Progress Notes (Signed)
Pt's states no medical or surgical changes since previsit or office visit.  Temp-nancy campbell  Vital signs-judy branson

## 2018-08-26 NOTE — Patient Instructions (Signed)
Handouts given for polyps, diverticulosis and hemorrhoids  YOU HAD AN ENDOSCOPIC PROCEDURE TODAY AT THE [facility]:   Refer to the procedure report that was given to you for any specific questions about what was found during the examination.  If the procedure report does not answer your questions, please call your gastroenterologist to clarify.  If you requested that your care partner not be given the details of your procedure findings, then the procedure report has been included in a sealed envelope for you to review at your convenience later.  YOU SHOULD EXPECT: Some feelings of bloating in the abdomen. Passage of more gas than usual.  Walking can help get rid of the air that was put into your GI tract during the procedure and reduce the bloating. If you had a lower endoscopy (such as a colonoscopy or flexible sigmoidoscopy) you may notice spotting of blood in your stool or on the toilet paper. If you underwent a bowel prep for your procedure, you may not have a normal bowel movement for a few days.  Please Note:  You might notice some irritation and congestion in your nose or some drainage.  This is from the oxygen used during your procedure.  There is no need for concern and it should clear up in a day or so.  SYMPTOMS TO REPORT IMMEDIATELY:   Following lower endoscopy (colonoscopy or flexible sigmoidoscopy):  Excessive amounts of blood in the stool  Significant tenderness or worsening of abdominal pains  Swelling of the abdomen that is new, acute  Fever of 100F or higher  For urgent or emergent issues, a gastroenterologist can be reached at any hour by calling [phone].   DIET:  We do recommend a small meal at first, but then you may proceed to your regular diet.  Drink plenty of fluids but you should avoid alcoholic beverages for 24 hours.  ACTIVITY:  You should plan to take it easy for the rest of today and you should NOT DRIVE or use heavy machinery until tomorrow  (because of the sedation medicines used during the test).    FOLLOW UP: Our staff will call the number listed on your records 48-72 hours following your procedure to check on you and address any questions or concerns that you may have regarding the information given to you following your procedure. If we do not reach you, we will leave a message.  We will attempt to reach you two times.  During this call, we will ask if you have developed any symptoms of COVID 19. If you develop any symptoms (ie: fever, flu-like symptoms, shortness of breath, cough etc.) before then, please call [phone].  If you test positive for Covid 19 in the 2 weeks post procedure, please call and report this information to us.    If any biopsies were taken you will be contacted by phone or by letter within the next 1-3 weeks.  Please call us at [phone] if you have not heard about the biopsies in 3 weeks.    SIGNATURES/CONFIDENTIALITY: You and/or your care partner have signed paperwork which will be entered into your electronic medical record.  These signatures attest to the fact that that the information above on your After Visit Summary has been reviewed and is understood.  Full responsibility of the confidentiality of this discharge information lies with you and/or your care-partner. 

## 2018-08-26 NOTE — Op Note (Signed)
[facility] Patient Name: [name] Procedure Date: 08/26/2018 1:36 PM MRN: [id number] Endoscopist: Mauri Pole , MD Age: 73 Referring MD:  Date of Birth: [birth year] Gender: Male Account #: [id number] Procedure:                Colonoscopy Indications:              Screening for colorectal malignant neoplasm Medicines:                Monitored Anesthesia Care Procedure:                Pre-Anesthesia Assessment:                           - Prior to the procedure, a History and Physical                            was performed, and patient medications and                            allergies were reviewed. The patient's tolerance of                            previous anesthesia was also reviewed. The risks                            and benefits of the procedure and the sedation                            options and risks were discussed with the patient.                            All questions were answered, and informed consent                            was obtained. Prior Anticoagulants: The patient has                            taken no previous anticoagulant or antiplatelet                            agents. ASA Grade Assessment: III - A patient with                            severe systemic disease. After reviewing the risks                            and benefits, the patient was deemed in                            satisfactory condition to undergo the procedure.                           After obtaining informed consent, the colonoscope  was passed under direct vision. Throughout the                            procedure, the patient's blood pressure, pulse, and                            oxygen saturations were monitored continuously. The                            Model PCF-[id number]) scope was introduced                            through the anus and advanced to the the terminal                            ileum,  with identification of the appendiceal                            orifice and IC valve. The colonoscopy was performed                            without difficulty. The patient tolerated the                            procedure well. The quality of the bowel                            preparation was good. The terminal ileum, ileocecal                            valve, appendiceal orifice, and rectum were                            photographed. Scope In: 1:44:13 PM Scope Out: 2:07:31 PM Scope Withdrawal Time: 0 hours 18 minutes 57 seconds  Total Procedure Duration: 0 hours 23 minutes 18 seconds  Findings:                 The perianal and digital rectal examinations were                            normal.                           Two sessile polyps were found in the sigmoid colon                            and cecum. The polyps were 1 to 2 mm in size. These                            polyps were removed with a cold biopsy forceps.                            Resection and retrieval were complete.  Two sessile polyps were found in the rectum and                            transverse colon. The polyps were 4 to 5 mm in                            size. These polyps were removed with a cold snare.                            Resection and retrieval were complete.                           Scattered small and large-mouthed diverticula were                            found in the sigmoid colon, descending colon,                            ascending colon and cecum.                           The terminal ileum and ileocecal valve contained                            pseudopolyps. Biopsies were taken with a cold                            forceps for histology. Complications:            No immediate complications. Estimated Blood Loss:     Estimated blood loss was minimal. Impression:               - Two 1 to 2 mm polyps in the sigmoid colon and in                             the cecum, removed with a cold biopsy forceps.                            Resected and retrieved.                           - Two 4 to 5 mm polyps in the rectum and in the                            transverse colon, removed with a cold snare.                            Resected and retrieved.                           - Moderate diverticulosis in the sigmoid colon, in                            the descending colon, in the ascending colon  and in                            the cecum.                           - Pseudopolyps in the terminal ileum and at the                            ileocecal valve. Biopsied. Recommendation:           - Patient has a contact number available for                            emergencies. The signs and symptoms of potential                            delayed complications were discussed with the                            patient. Return to normal activities tomorrow.                            Written discharge instructions were provided to the                            patient.                           - Resume previous diet.                           - Continue present medications.                           - Await pathology results.                           - Repeat colonoscopy in 3 - 5 years for                            surveillance based on pathology results. Mauri Pole, MD 08/26/2018 2:13:37 PM This report has been signed electronically.

## 2018-08-26 NOTE — Progress Notes (Signed)
A/ox3, pleased with MAC, report to RN 

## 2018-08-28 ENCOUNTER — Telehealth: Payer: Self-pay | Admitting: *Deleted

## 2018-08-28 NOTE — Telephone Encounter (Signed)
1. Have you developed a fever since your procedure? no  2.   Have you had an respiratory symptoms (SOB or cough) since your procedure? no  3.   Have you tested positive for COVID 19 since your procedure no  4.   Have you had any family members/close contacts diagnosed with the COVID 19 since your procedure?  no   If yes to any of these questions please route to Joylene John, RN and Alphonsa Gin, Therapist, sports.  Follow up Call-  Call back number 08/26/2018  Post procedure Call Back phone  # [phone]  Permission to leave phone message Yes  Some recent data might be hidden     Patient questions:  Do you have a fever, pain , or abdominal swelling? No. Pain Score  0 *  Have you tolerated food without any problems? Yes.    Have you been able to return to your normal activities? Yes.    Do you have any questions about your discharge instructions: Diet   No. Medications  No. Follow up visit  No.  Do you have questions or concerns about your Care? No.  Actions: * If pain score is 4 or above: No action needed, pain <4.

## 2018-09-03 ENCOUNTER — Encounter: Payer: Self-pay | Admitting: Gastroenterology

## 2018-09-04 ENCOUNTER — Encounter: Payer: Self-pay | Admitting: Family Medicine

## 2018-09-04 DIAGNOSIS — Z1211 Encounter for screening for malignant neoplasm of colon: Secondary | ICD-10-CM | POA: Diagnosis not present

## 2018-11-03 ENCOUNTER — Other Ambulatory Visit: Payer: Self-pay | Admitting: Physician Assistant

## 2018-11-25 ENCOUNTER — Other Ambulatory Visit: Payer: Self-pay

## 2018-11-26 ENCOUNTER — Ambulatory Visit (INDEPENDENT_AMBULATORY_CARE_PROVIDER_SITE_OTHER): Payer: PPO

## 2018-11-26 ENCOUNTER — Other Ambulatory Visit: Payer: Self-pay

## 2018-11-26 DIAGNOSIS — Z23 Encounter for immunization: Secondary | ICD-10-CM | POA: Diagnosis not present

## 2019-01-21 DIAGNOSIS — Z7189 Other specified counseling: Secondary | ICD-10-CM | POA: Insufficient documentation

## 2019-01-21 NOTE — Progress Notes (Signed)
HPI The patient presents with a one year followup of CAD.   The patient has no new sypmtoms.  No further cardiovascular testing is indicated.  We will continue with aggressive risk reduction and meds as listed.  He still owns 14 mowers and "tinkers" on them.  He does a fair amount of lawn work.  The patient denies any new symptoms such as chest discomfort, neck or arm discomfort. There has been no new shortness of breath, PND or orthopnea. There have been no reported palpitations, presyncope or syncope.   No Known Allergies  Current Outpatient Medications  Medication Sig Dispense Refill  . aspirin EC 81 MG tablet Take 81 mg by mouth every morning.      Marland Kitchen atorvastatin (LIPITOR) 20 MG tablet TAKE 1 TABLET BY MOUTH EVERY DAY 90 tablet 2  . Cholecalciferol (VITAMIN D) 2000 UNITS CAPS Take 1 capsule by mouth daily.      Marland Kitchen escitalopram (LEXAPRO) 20 MG tablet Take 1 tablet (20 mg total) by mouth daily. 90 tablet 3  . fluticasone (FLONASE) 50 MCG/ACT nasal spray SPRAY 2 SPRAYS INTO EACH NOSTRIL EVERY DAY 48 mL 2  . loratadine (CLARITIN) 10 MG tablet Take 1 tablet (10 mg total) by mouth daily. 90 tablet 3  . LORazepam (ATIVAN) 0.5 MG tablet Take 0.5 mg by mouth as needed for anxiety. Reported on 04/28/2015    . Multiple Vitamins-Minerals (MULTIVITAMIN WITH MINERALS) tablet Take 1 tablet by mouth daily.      . nitroGLYCERIN (NITROSTAT) 0.4 MG SL tablet Place 0.4 mg under the tongue every 5 (five) minutes as needed. Reported on 04/01/2015    . acetaminophen (TYLENOL) 500 MG tablet Take 1,000 mg by mouth every 6 (six) hours as needed.     No current facility-administered medications for this visit.     Past Medical History:  Diagnosis Date  . Cancer (HCC)    Skin  . Carotid stenosis   . Coronary artery disease    s/p Taxus stenting to the LAD.  This was 2005. Catheterization in 2008, demonstrated a proximal calcification with ostial 40-50% stenosis, it was a widely patent stent, the circumflex  had 40%  proximal stenosis,  the right coronary artery is large and dominant with minor nonobstructive plaque, the EF was 55%.  . Degenerative joint disease   . Depression with anxiety   . Dyslipidemia   . Sinus bradycardia     Past Surgical History:  Procedure Laterality Date  . COLONOSCOPY  x3-last time 10 yrs ago   [facility]  . CORONARY ANGIOPLASTY WITH STENT PLACEMENT  2005?  Marland Kitchen HERNIA REPAIR    . HIP ARTHROSCOPY    . ORTHOPEDIC SURGERY     right foot and right hip secondary to DJD  . SKIN CANCER EXCISION    . UMBILICAL HERNIA REPAIR      ROS: As stated in the HPI and negative for all other systems.  PHYSICAL EXAM BP 140/80   Pulse 65   Ht 5\' 11"  (1.803 m)   Wt 238 lb (108 kg)   BMI 33.19 kg/m   GENERAL:  Well appearing NECK:  No jugular venous distention, waveform within normal limits, carotid upstroke brisk and symmetric, no bruits, no thyromegaly LUNGS:  Clear to auscultation bilaterally CHEST:  Unremarkable HEART:  PMI not displaced or sustained,S1 and S2 within normal limits, no S3, no S4, no clicks, no rubs, no murmurs ABD:  Flat, positive bowel sounds normal in frequency in pitch, no  bruits, no rebound, no guarding, no midline pulsatile mass, no hepatomegaly, no splenomegaly EXT:  2 plus pulses throughout, no edema, no cyanosis no clubbing   Lab Results  Component Value Date   CHOL 133 01/01/2018   TRIG 77 01/01/2018   HDL 44 01/01/2018   LDLCALC 74 01/01/2018   LDLDIRECT 67.0 06/01/2006    EKG:  Sinus rhythm, rate 65, axis within normal limits, intervals within normal limits, no acute ST-T wave changes.  01/22/2019   ASSESSMENT AND PLAN   CAD:   The patient has no new sypmtoms.  No further cardiovascular testing is indicated.  We will continue with aggressive risk reduction and meds as listed.  CAROTID STENOSIS:  I will likely repeat  Doppler next year after our annual visit.   DYSLIPIDEMIA:     He will get a fasting lipid profile.   COVID EDUCATION:   We talked at length about the vaccine.

## 2019-01-22 ENCOUNTER — Other Ambulatory Visit: Payer: Self-pay

## 2019-01-22 ENCOUNTER — Ambulatory Visit: Payer: PPO | Admitting: Cardiology

## 2019-01-22 ENCOUNTER — Encounter: Payer: Self-pay | Admitting: Cardiology

## 2019-01-22 VITALS — BP 140/80 | HR 65 | Ht 71.0 in | Wt 238.0 lb

## 2019-01-22 DIAGNOSIS — I2510 Atherosclerotic heart disease of native coronary artery without angina pectoris: Secondary | ICD-10-CM | POA: Diagnosis not present

## 2019-01-22 DIAGNOSIS — E785 Hyperlipidemia, unspecified: Secondary | ICD-10-CM

## 2019-01-22 DIAGNOSIS — Z7189 Other specified counseling: Secondary | ICD-10-CM

## 2019-01-22 NOTE — Patient Instructions (Signed)
Medication Instructions:  The current medical regimen is effective;  continue present plan and medications.  *If you need a refill on your cardiac medications before your next appointment, please call your pharmacy*  Follow-Up: At [facility], you and your health needs are our priority.  As part of our continuing mission to provide you with exceptional heart care, we have created designated Provider Care Teams.  These Care Teams include your primary Cardiologist (physician) and Advanced Practice Providers (APPs -  Physician Assistants and Nurse Practitioners) who all work together to provide you with the care you need, when you need it.  Your next appointment:   12 month(s)  The format for your next appointment:   In Person  Provider:   Dr Minus Breeding  Thank you for choosing [facility]!!

## 2019-01-28 ENCOUNTER — Other Ambulatory Visit: Payer: Self-pay | Admitting: Physician Assistant

## 2019-01-28 DIAGNOSIS — E785 Hyperlipidemia, unspecified: Secondary | ICD-10-CM

## 2019-03-03 ENCOUNTER — Other Ambulatory Visit: Payer: Self-pay | Admitting: Physician Assistant

## 2019-03-03 DIAGNOSIS — J301 Allergic rhinitis due to pollen: Secondary | ICD-10-CM

## 2019-04-23 ENCOUNTER — Ambulatory Visit (INDEPENDENT_AMBULATORY_CARE_PROVIDER_SITE_OTHER): Payer: PPO | Admitting: *Deleted

## 2019-04-23 DIAGNOSIS — Z0000 Encounter for general adult medical examination without abnormal findings: Secondary | ICD-10-CM

## 2019-04-23 NOTE — Progress Notes (Signed)
MEDICARE ANNUAL WELLNESS VISIT  04/23/2019  Telephone Visit Disclaimer This Medicare AWV was conducted by telephone due to national recommendations for restrictions regarding the COVID-19 Pandemic (e.g. social distancing).  I verified, using two identifiers, that I am speaking with [name] or their authorized healthcare agent. I discussed the limitations, risks, security, and privacy concerns of performing an evaluation and management service by telephone and the potential availability of an in-person appointment in the future. The patient expressed understanding and agreed to proceed.   Subjective:  [name] is a 74 y.o. male patient of Terald Sleeper, PA-C who had a Medicare Annual Wellness Visit today via telephone. 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 He is legally married but has lived separately from his wife for over 48 years. he has 3 children. he reports that he is socially active and does interact with friends/family regularly. he is minimally physically active and enjoys watching NASCAR and doing yard work.  Patient Care Team: Theodoro Clock as PCP - General (Physician Assistant)  Advanced Directives 04/23/2019 04/27/2014  Does Patient Have a Medical Advance Directive? No No  Would patient like information on creating a medical advance directive? No - Patient declined No - patient declined information    Hospital Utilization Over the Past 12 Months: # of hospitalizations or ER visits: 0 # of surgeries: 0  Review of Systems    Patient reports that his overall health is unchanged compared to last year.  History obtained from chart review  Patient Reported Readings (BP, Pulse, CBG, Weight, etc) none  Pain Assessment Pain : No/denies pain     Current Medications & Allergies (verified) Allergies as of 04/23/2019   No Known Allergies     Medication List       Accurate as of April 23, 2019  9:00 AM. If you have any  questions, ask your nurse or doctor.        acetaminophen 500 MG tablet Commonly known as: TYLENOL Take 1,000 mg by mouth every 6 (six) hours as needed.   aspirin EC 81 MG tablet Take 81 mg by mouth every morning.   atorvastatin 20 MG tablet Commonly known as: LIPITOR TAKE 1 TABLET BY MOUTH EVERY DAY   escitalopram 20 MG tablet Commonly known as: LEXAPRO Take 1 tablet (20 mg total) by mouth daily.   fluticasone 50 MCG/ACT nasal spray Commonly known as: FLONASE SPRAY 2 SPRAYS INTO EACH NOSTRIL EVERY DAY   loratadine 10 MG tablet Commonly known as: CLARITIN TAKE 1 TABLET BY MOUTH EVERY DAY   LORazepam 0.5 MG tablet Commonly known as: ATIVAN Take 0.5 mg by mouth as needed for anxiety. Reported on 04/28/2015   multivitamin with minerals tablet Take 1 tablet by mouth daily.   nitroGLYCERIN 0.4 MG SL tablet Commonly known as: NITROSTAT Place 0.4 mg under the tongue every 5 (five) minutes as needed. Reported on 04/01/2015   Vitamin D 50 MCG (2000 UT) Caps Take 1 capsule by mouth daily.       History (reviewed): Past Medical History:  Diagnosis Date  . Cancer (HCC)    Skin  . Carotid stenosis   . Coronary artery disease    s/p Taxus stenting to the LAD.  This was 2005. Catheterization in 2008, demonstrated a proximal calcification with ostial 40-50% stenosis, it was a widely patent stent, the circumflex had 40%  proximal stenosis,  the right coronary artery is large and dominant with  minor nonobstructive plaque, the EF was 55%.  . Degenerative joint disease   . Depression with anxiety   . Dyslipidemia   . Sinus bradycardia    Past Surgical History:  Procedure Laterality Date  . COLONOSCOPY  x3-last time 10 yrs ago   [facility]  . CORONARY ANGIOPLASTY WITH STENT PLACEMENT  2005?  [facility] HERNIA REPAIR    . HIP ARTHROSCOPY    . ORTHOPEDIC SURGERY     right foot and right hip secondary to DJD  . SKIN CANCER EXCISION    . UMBILICAL HERNIA REPAIR     Family History    Problem Relation Age of Onset  . COPD Mother   . Coronary artery disease Father        stent  . Diabetes Father   . Coronary artery disease Other        s/p MI  . Colon cancer Neg Hx   . Esophageal cancer Neg Hx   . Rectal cancer Neg Hx   . Stomach cancer Neg Hx   . Colon polyps Neg Hx    Social History   Socioeconomic History  . Marital status: Married    Spouse name: Not on file  . Number of children: 3  . Years of education: 7th Grade  . Highest education level: 7th grade  Occupational History  . Occupation: Retired    Comment: Clinical biochemist  Tobacco Use  . Smoking status: Former Smoker    Packs/day: 1.00    Years: 36.00    Pack years: 36.00    Types: Cigarettes    Quit date: 02/14/2003    Years since quitting: 16.1  . Smokeless tobacco: Never Used  Substance and Sexual Activity  . Alcohol use: No    Alcohol/week: 0.0 standard drinks    Comment: quit completely in 2005  . Drug use: No  . Sexual activity: Not Currently  Other Topics Concern  . Not on file  Social History Narrative   Lives at home with Daughter Judeen Hammans) and Yolanda Bonine (Chaz)-he is still legally married but has lived separate from wife for over 37 years   Social Determinants of Health   Financial Resource Strain: Low Risk   . Difficulty of Paying Living Expenses: Not hard at all  Food Insecurity: No Food Insecurity  . Worried About Charity fundraiser in the Last Year: Never true  . Ran Out of Food in the Last Year: Never true  Transportation Needs: No Transportation Needs  . Lack of Transportation (Medical): No  . Lack of Transportation (Non-Medical): No  Physical Activity: Inactive  . Days of Exercise per Week: 0 days  . Minutes of Exercise per Session: 0 min  Stress: No Stress Concern Present  . Feeling of Stress : Only a little  Social Connections: Somewhat Isolated  . Frequency of Communication with Friends and Family: More than three times a week  . Frequency of Social Gatherings with  Friends and Family: More than three times a week  . Attends Religious Services: Never  . Active Member of Clubs or Organizations: No  . Attends Archivist Meetings: Never  . Marital Status: Married    Activities of Daily Living In your present state of health, do you have any difficulty performing the following activities: 04/23/2019  Hearing? N  Vision? N  Comment wears glasses-gets yearly eye exam  Difficulty concentrating or making decisions? N  Walking or climbing stairs? N  Dressing or bathing? N  Doing errands, shopping? N  Preparing Food and eating ? N  Using the Toilet? N  In the past six months, have you accidently leaked urine? N  Do you have problems with loss of bowel control? N  Managing your Medications? N  Managing your Finances? N  Housekeeping or managing your Housekeeping? N  Some recent data might be hidden    Patient Education/ Literacy How often do you need to have someone help you when you read instructions, pamphlets, or other written materials from your doctor or pharmacy?: 1 - Never What is the last grade level you completed in school?: 7th grade  Exercise Current Exercise Habits: The patient does not participate in regular exercise at present, Exercise limited by: cardiac condition(s)  Diet Patient reports consuming 3 meals a day and 1 snack(s) a day Patient reports that his primary diet is: Regular Patient reports that she does have regular access to food.   Depression Screen PHQ 2/9 Scores 04/23/2019 04/22/2018 03/04/2018 04/24/2017 01/24/2017 12/11/2016 11/07/2016  PHQ - 2 Score 0 0 0 0 0 0 0  PHQ- 9 Score 0 - - - - - -     Fall Risk Fall Risk  04/23/2019 04/22/2018 03/04/2018 04/24/2017 12/11/2016  Falls in the past year? 0 0 0 No Yes  Number falls in past yr: - - - - 1  Injury with Fall? - - - - No  Comment - - - - -     Objective:  [name] seemed alert and oriented and he participated appropriately during our telephone  visit.  Blood Pressure Weight BMI  BP Readings from Last 3 Encounters:  01/22/19 140/80  08/26/18 132/82  07/19/18 (!) 148/75   Wt Readings from Last 3 Encounters:  01/22/19 238 lb (108 kg)  08/26/18 235 lb (106.6 kg)  08/13/18 235 lb (106.6 kg)   BMI Readings from Last 1 Encounters:  01/22/19 33.19 kg/m    *Unable to obtain current vital signs, weight, and BMI due to telephone visit type  Hearing/Vision  . Jyson did not seem to have difficulty with hearing/understanding during the telephone conversation . Reports that he has had a formal eye exam by an eye care professional within the past year . Reports that he has not had a formal hearing evaluation within the past year *Unable to fully assess hearing and vision during telephone visit type  Cognitive Function: 6CIT Screen 04/23/2019  What Year? 0 points  What month? 0 points  What time? 0 points  Count back from 20 0 points  Months in reverse 0 points  Repeat phrase 2 points  Total Score 2   (Normal:0-7, Significant for Dysfunction: >8)  Normal Cognitive Function Screening: Yes   Immunization & Health Maintenance Record Immunization History  Administered Date(s) Administered  . Fluad Quad(high Dose 65+) 11/26/2018  . Influenza, High Dose Seasonal PF 01/03/2016, 12/11/2016, 11/26/2017  . Influenza,inj,Quad PF,6+ Mos 12/10/2012, 11/19/2013, 12/09/2014  . Pneumococcal Conjugate-13 10/27/2013  . Pneumococcal Polysaccharide-23 06/09/2015  . Tdap 01/27/2011  . Zoster Recombinat (Shingrix) 04/22/2018    Health Maintenance  Topic Date Due  . COLON CANCER SCREENING ANNUAL FOBT  09/04/2019  . TETANUS/TDAP  01/26/2021  . COLONOSCOPY  08/25/2021  . INFLUENZA VACCINE  Completed  . Hepatitis C Screening  Completed  . PNA vac Low Risk Adult  Completed       Assessment  This is a routine wellness examination for [name].  Health Maintenance: Due or Overdue There are no preventive care reminders to  display  for this patient.  [name] does not need a referral for Community Assistance: Care Management:   no Social Work:    no Prescription Assistance:  no Nutrition/Diabetes Education:  no   Plan:  Personalized Goals Goals Addressed            This Visit's Progress   . DIET - INCREASE WATER INTAKE       Try to drink 6-8 glasses of water daily      Personalized Health Maintenance & Screening Recommendations  He is up to date on all recommended health screenings  Lung Cancer Screening Recommended: no (Low Dose CT Chest recommended if Age 67-80 years, 30 pack-year currently smoking OR have quit w/in past 15 years) Hepatitis C Screening recommended: no HIV Screening recommended: no  Advanced Directives: Written information was not prepared per patient's request.  Referrals & Orders No orders of the defined types were placed in this encounter.   Follow-up Plan . Follow-up with Terald Sleeper, PA-C as planned . Bring the Immunization record for the COVID vaccines you received so we can document them   I have personally reviewed and noted the following in the patient's chart:   . Medical and social history . Use of alcohol, tobacco or illicit drugs  . Current medications and supplements . Functional ability and status . Nutritional status . Physical activity . Advanced directives . List of other physicians . Hospitalizations, surgeries, and ER visits in previous 12 months . Vitals . Screenings to include cognitive, depression, and falls . Referrals and appointments  In addition, I have reviewed and discussed with [name] certain preventive protocols, quality metrics, and best practice recommendations. A written personalized care plan for preventive services as well as general preventive health recommendations is available and can be mailed to the patient at his request.      Milas Hock, LPN  [id number]

## 2019-04-23 NOTE — Patient Instructions (Signed)

## 2019-05-21 ENCOUNTER — Encounter: Payer: Self-pay | Admitting: Family Medicine

## 2019-05-21 ENCOUNTER — Ambulatory Visit (INDEPENDENT_AMBULATORY_CARE_PROVIDER_SITE_OTHER): Payer: PPO | Admitting: Family Medicine

## 2019-05-21 ENCOUNTER — Other Ambulatory Visit: Payer: Self-pay

## 2019-05-21 DIAGNOSIS — L0292 Furuncle, unspecified: Secondary | ICD-10-CM | POA: Diagnosis not present

## 2019-05-21 DIAGNOSIS — J0100 Acute maxillary sinusitis, unspecified: Secondary | ICD-10-CM

## 2019-05-21 DIAGNOSIS — K625 Hemorrhage of anus and rectum: Secondary | ICD-10-CM

## 2019-05-21 MED ORDER — AMOXICILLIN-POT CLAVULANATE 875-125 MG PO TABS: 1.0000 | ORAL_TABLET | Freq: Two times a day (BID) | ORAL | 0 refills | Status: DC

## 2019-05-21 NOTE — Progress Notes (Signed)
Subjective:    Patient ID: [name], male    DOB: 07/04/1945, 74 y.o.   MRN: [id number]   HPI: [name] is a 74 y.o. male presenting for blood in stool. Hasn't been eating right. It's fresh blood. Onset 2 weeks ago. Occurring twice. Not a lot of volume. Denies pain. Hemorrhoids several years ago. Recent colonoscopy, 08/26/2018 showed diverticulosis.   Boil under left arm. Very painful for 4-5 days. Onset was remote. Big as a coffee cup for the last few days.    Depression screen [facility] 2/9 04/23/2019 04/22/2018 03/04/2018 04/24/2017 01/24/2017  Decreased Interest 0 0 0 0 0  Down, Depressed, Hopeless 0 0 0 0 0  PHQ - 2 Score 0 0 0 0 0  Altered sleeping 0 - - - -  Tired, decreased energy 0 - - - -  Change in appetite 0 - - - -  Feeling bad or failure about yourself  0 - - - -  Trouble concentrating 0 - - - -  Moving slowly or fidgety/restless 0 - - - -  Suicidal thoughts 0 - - - -  PHQ-9 Score 0 - - - -  Difficult doing work/chores Not difficult at all - - - -     Relevant past medical, surgical, family and social history reviewed and updated as indicated.  Interim medical history since our last visit reviewed. Allergies and medications reviewed and updated.  ROS:  Review of Systems  Constitutional: Negative for activity change, appetite change, chills and fever.  HENT: Positive for congestion, postnasal drip, rhinorrhea and sinus pressure. Negative for ear discharge, ear pain, hearing loss, nosebleeds, sneezing and trouble swallowing.   Respiratory: Negative for chest tightness and shortness of breath.   Cardiovascular: Negative for chest pain.  Gastrointestinal: Negative for abdominal pain.  Skin: Negative for rash.     Social History   Tobacco Use  Smoking Status Former Smoker  . Packs/day: 1.00  . Years: 36.00  . Pack years: 36.00  . Types: Cigarettes  . Quit date: 02/14/2003  . Years since quitting: 16.2  Smokeless Tobacco Never Used       Objective:     Wt Readings from Last 3 Encounters:  01/22/19 238 lb (108 kg)  08/26/18 235 lb (106.6 kg)  08/13/18 235 lb (106.6 kg)     Exam deferred. Pt. Harboring due to COVID 19. Phone visit performed.   Assessment & Plan:   1. Acute maxillary sinusitis, recurrence not specified   2. Bright red rectal bleeding   3. Boil     Meds ordered this encounter  Medications  . amoxicillin-clavulanate (AUGMENTIN) 875-125 MG tablet    Sig: Take 1 tablet by mouth 2 (two) times daily. Take all of this medication    Dispense:  20 tablet    Refill:  0        Diagnoses and all orders for this visit:  Acute maxillary sinusitis, recurrence not specified  Bright red rectal bleeding  Boil Comments: left axilla   Other orders -     amoxicillin-clavulanate (AUGMENTIN) 875-125 MG tablet; Take 1 tablet by mouth 2 (two) times daily. Take all of this medication  Patient was advised that he should have the boil lanced.  He needs to call the office and set up by time for the procedure with tomorrow's acute care provider.  In the meantime start taking the Augmentin.  He can apply heat.  He should be safe to take ibuprofen  short-term up to 600 mg every 6 hours as needed pain along with Tylenol.  Since the rectal bleeding is minor in volume and frequency, and his colonoscopy showed diverticula which could explain this reasonably I suggested observation for now.  If the frequency and volume of blood increases he should have that evaluated more closely.  Virtual Visit via telephone Note  I discussed the limitations, risks, security and privacy concerns of performing an evaluation and management service by telephone and the availability of in person appointments. The patient was identified with two identifiers. Pt.expressed understanding and agreed to proceed. Pt. Is at home. Dr. Livia Snellen is in his office.  Follow Up Instructions:   I discussed the assessment and treatment plan with the patient. The patient was  provided an opportunity to ask questions and all were answered. The patient agreed with the plan and demonstrated an understanding of the instructions.   The patient was advised to call back or seek an in-person evaluation if the symptoms worsen or if the condition fails to improve as anticipated.   Total minutes including chart review and phone contact time: 20   Follow up plan: Return in about 1 day (around 05/22/2019) for boil.  Claretta Fraise, MD [place]

## 2019-05-22 DIAGNOSIS — R222 Localized swelling, mass and lump, trunk: Secondary | ICD-10-CM | POA: Diagnosis not present

## 2019-05-22 DIAGNOSIS — L03313 Cellulitis of chest wall: Secondary | ICD-10-CM | POA: Diagnosis not present

## 2019-05-22 DIAGNOSIS — L0390 Cellulitis, unspecified: Secondary | ICD-10-CM | POA: Diagnosis not present

## 2019-05-22 DIAGNOSIS — E785 Hyperlipidemia, unspecified: Secondary | ICD-10-CM | POA: Diagnosis not present

## 2019-05-22 DIAGNOSIS — R2232 Localized swelling, mass and lump, left upper limb: Secondary | ICD-10-CM | POA: Diagnosis not present

## 2019-05-22 DIAGNOSIS — N61 Mastitis without abscess: Secondary | ICD-10-CM | POA: Diagnosis not present

## 2019-05-22 DIAGNOSIS — R739 Hyperglycemia, unspecified: Secondary | ICD-10-CM | POA: Diagnosis not present

## 2019-05-26 ENCOUNTER — Ambulatory Visit: Payer: PPO | Admitting: Nurse Practitioner

## 2019-05-26 DIAGNOSIS — L02213 Cutaneous abscess of chest wall: Secondary | ICD-10-CM | POA: Diagnosis not present

## 2019-05-26 DIAGNOSIS — J869 Pyothorax without fistula: Secondary | ICD-10-CM | POA: Diagnosis not present

## 2019-05-27 DIAGNOSIS — D72829 Elevated white blood cell count, unspecified: Secondary | ICD-10-CM | POA: Diagnosis not present

## 2019-05-27 DIAGNOSIS — Z20822 Contact with and (suspected) exposure to covid-19: Secondary | ICD-10-CM | POA: Diagnosis not present

## 2019-05-27 DIAGNOSIS — Z7982 Long term (current) use of aspirin: Secondary | ICD-10-CM | POA: Diagnosis not present

## 2019-05-27 DIAGNOSIS — L02412 Cutaneous abscess of left axilla: Secondary | ICD-10-CM | POA: Diagnosis not present

## 2019-05-27 DIAGNOSIS — E785 Hyperlipidemia, unspecified: Secondary | ICD-10-CM | POA: Diagnosis not present

## 2019-05-27 DIAGNOSIS — R7309 Other abnormal glucose: Secondary | ICD-10-CM | POA: Diagnosis not present

## 2019-05-27 DIAGNOSIS — R739 Hyperglycemia, unspecified: Secondary | ICD-10-CM | POA: Diagnosis not present

## 2019-05-27 DIAGNOSIS — L03313 Cellulitis of chest wall: Secondary | ICD-10-CM | POA: Diagnosis not present

## 2019-05-27 DIAGNOSIS — E559 Vitamin D deficiency, unspecified: Secondary | ICD-10-CM | POA: Diagnosis not present

## 2019-05-27 DIAGNOSIS — R7303 Prediabetes: Secondary | ICD-10-CM | POA: Diagnosis not present

## 2019-05-27 DIAGNOSIS — Z87891 Personal history of nicotine dependence: Secondary | ICD-10-CM | POA: Diagnosis not present

## 2019-05-27 DIAGNOSIS — H04319 Phlegmonous dacryocystitis of unspecified lacrimal passage: Secondary | ICD-10-CM | POA: Diagnosis not present

## 2019-05-28 ENCOUNTER — Telehealth: Payer: Self-pay | Admitting: *Deleted

## 2019-05-28 NOTE — Telephone Encounter (Signed)
Called and spoke with pt and he was admitted at [facility] from 05/22/19 till 05/27/19 for a large abscess. He was told to follow up with General Surgery 06/04/19 which he already has an appt scheduled with them.     Levell July, MD  General Surgery  NPI: [id number]  [address]  [place] [address]    Phone: [phone]  Fax: [phone]

## 2019-06-01 ENCOUNTER — Other Ambulatory Visit: Payer: Self-pay | Admitting: Family Medicine

## 2019-06-01 DIAGNOSIS — E785 Hyperlipidemia, unspecified: Secondary | ICD-10-CM

## 2019-06-01 DIAGNOSIS — F419 Anxiety disorder, unspecified: Secondary | ICD-10-CM

## 2019-06-02 ENCOUNTER — Ambulatory Visit (INDEPENDENT_AMBULATORY_CARE_PROVIDER_SITE_OTHER): Payer: PPO | Admitting: Family Medicine

## 2019-06-02 ENCOUNTER — Encounter: Payer: Self-pay | Admitting: Family Medicine

## 2019-06-02 DIAGNOSIS — E785 Hyperlipidemia, unspecified: Secondary | ICD-10-CM | POA: Diagnosis not present

## 2019-06-02 DIAGNOSIS — F3340 Major depressive disorder, recurrent, in remission, unspecified: Secondary | ICD-10-CM | POA: Diagnosis not present

## 2019-06-02 MED ORDER — ATORVASTATIN CALCIUM 20 MG PO TABS: 20.0000 mg | ORAL_TABLET | Freq: Every day | ORAL | 0 refills | Status: DC

## 2019-06-02 MED ORDER — ESCITALOPRAM OXALATE 20 MG PO TABS: 20.0000 mg | ORAL_TABLET | Freq: Every day | ORAL | 0 refills | Status: DC

## 2019-06-02 NOTE — Telephone Encounter (Signed)
Appointment scheduled- one month supply given.  

## 2019-06-02 NOTE — Progress Notes (Signed)
Subjective:    Patient ID: [name], male    DOB: Feb 26, 1945, 74 y.o.   MRN: [id number]   HPI: [name] is a 74 y.o. male presenting for  in for follow-up of elevated cholesterol. Doing well without complaints on current medication. Denies side effects of statin including myalgia and arthralgia and nausea. Currently no chest pain, shortness of breath or other cardiovascular related symptoms noted.  Also treated for anxiety and depression with escitalopram. PHQ from 5 weeks ago below. No significant change in mood or affect reported since then.   Spent five days in hospital last week due abscess under arm. Dx as a phlegmon. Has follow up tomorrow.    Depression screen [facility] 2/9 04/23/2019 04/22/2018 03/04/2018 04/24/2017 01/24/2017  Decreased Interest 0 0 0 0 0  Down, Depressed, Hopeless 0 0 0 0 0  PHQ - 2 Score 0 0 0 0 0  Altered sleeping 0 - - - -  Tired, decreased energy 0 - - - -  Change in appetite 0 - - - -  Feeling bad or failure about yourself  0 - - - -  Trouble concentrating 0 - - - -  Moving slowly or fidgety/restless 0 - - - -  Suicidal thoughts 0 - - - -  PHQ-9 Score 0 - - - -  Difficult doing work/chores Not difficult at all - - - -     Relevant past medical, surgical, family and social history reviewed and updated as indicated.  Interim medical history since our last visit reviewed. Allergies and medications reviewed and updated.  ROS:  Review of Systems  Constitutional: Negative for fever.  Respiratory: Negative for shortness of breath.   Cardiovascular: Negative for chest pain.  Musculoskeletal: Negative for arthralgias.  Skin: Negative for rash.     Social History   Tobacco Use  Smoking Status Former Smoker  . Packs/day: 1.00  . Years: 36.00  . Pack years: 36.00  . Types: Cigarettes  . Quit date: 02/14/2003  . Years since quitting: 16.3  Smokeless Tobacco Never Used       Objective:     Wt Readings from Last 3 Encounters:  01/22/19  238 lb (108 kg)  08/26/18 235 lb (106.6 kg)  08/13/18 235 lb (106.6 kg)     Exam deferred. Pt. Harboring due to COVID 19. Phone visit performed.   Assessment & Plan:   1. Hyperlipidemia with target LDL less than 100   2. Recurrent major depressive disorder, in remission ([place])     No orders of the defined types were placed in this encounter.   No orders of the defined types were placed in this encounter.     Diagnoses and all orders for this visit:  Hyperlipidemia with target LDL less than 100  Recurrent major depressive disorder, in remission [facility])    Virtual Visit via telephone Note  I discussed the limitations, risks, security and privacy concerns of performing an evaluation and management service by telephone and the availability of in person appointments. The patient was identified with two identifiers. Pt.expressed understanding and agreed to proceed. Pt. Is at home. Dr. Livia Snellen is in his office.  Follow Up Instructions:   I discussed the assessment and treatment plan with the patient. The patient was provided an opportunity to ask questions and all were answered. The patient agreed with the plan and demonstrated an understanding of the instructions.   The patient was advised to call back or seek  an in-person evaluation if the symptoms worsen or if the condition fails to improve as anticipated.   Total minutes including chart review and phone contact time: 16   Follow up plan: Return in about 2 weeks (around 06/16/2019) for cholesterol, 6 mos check up.Claretta Fraise, MD [place]

## 2019-06-03 DIAGNOSIS — I2510 Atherosclerotic heart disease of native coronary artery without angina pectoris: Secondary | ICD-10-CM | POA: Diagnosis not present

## 2019-06-03 DIAGNOSIS — Z955 Presence of coronary angioplasty implant and graft: Secondary | ICD-10-CM | POA: Diagnosis not present

## 2019-06-03 DIAGNOSIS — Z87891 Personal history of nicotine dependence: Secondary | ICD-10-CM | POA: Diagnosis not present

## 2019-06-03 DIAGNOSIS — R7303 Prediabetes: Secondary | ICD-10-CM | POA: Diagnosis not present

## 2019-06-03 DIAGNOSIS — E785 Hyperlipidemia, unspecified: Secondary | ICD-10-CM | POA: Diagnosis not present

## 2019-06-03 DIAGNOSIS — L03111 Cellulitis of right axilla: Secondary | ICD-10-CM | POA: Diagnosis not present

## 2019-06-03 DIAGNOSIS — L03112 Cellulitis of left axilla: Secondary | ICD-10-CM | POA: Diagnosis not present

## 2019-06-07 IMAGING — DX DG FOOT COMPLETE 3+V*L*
3 series · 3 of 3 positions shown · non-contrast
Comparison: None.

CLINICAL DATA: Left heel pain

EXAM:
LEFT FOOT - COMPLETE 3+ VIEW

[foot ap]
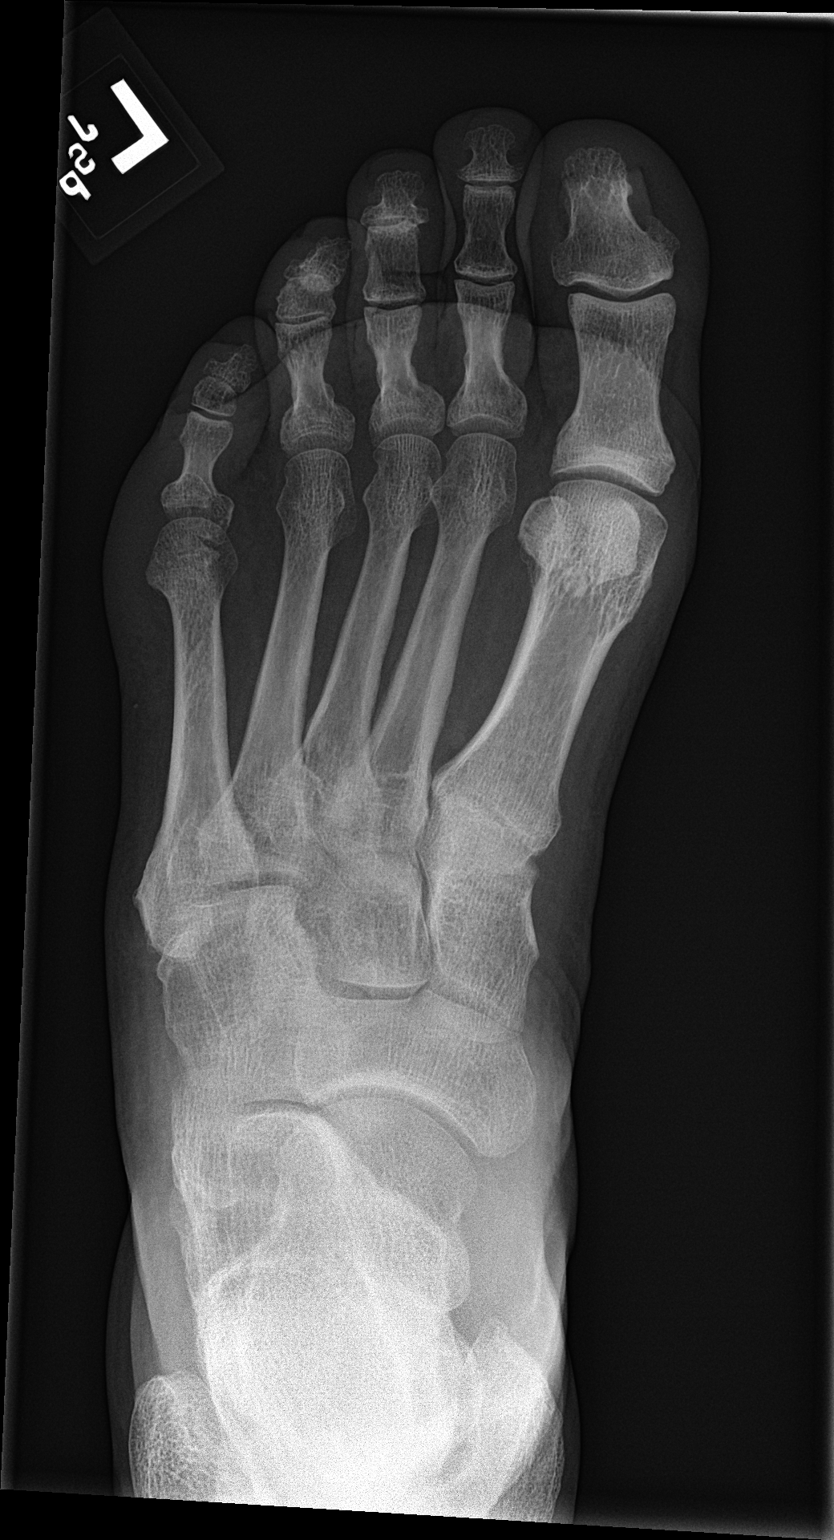

[foot obl]
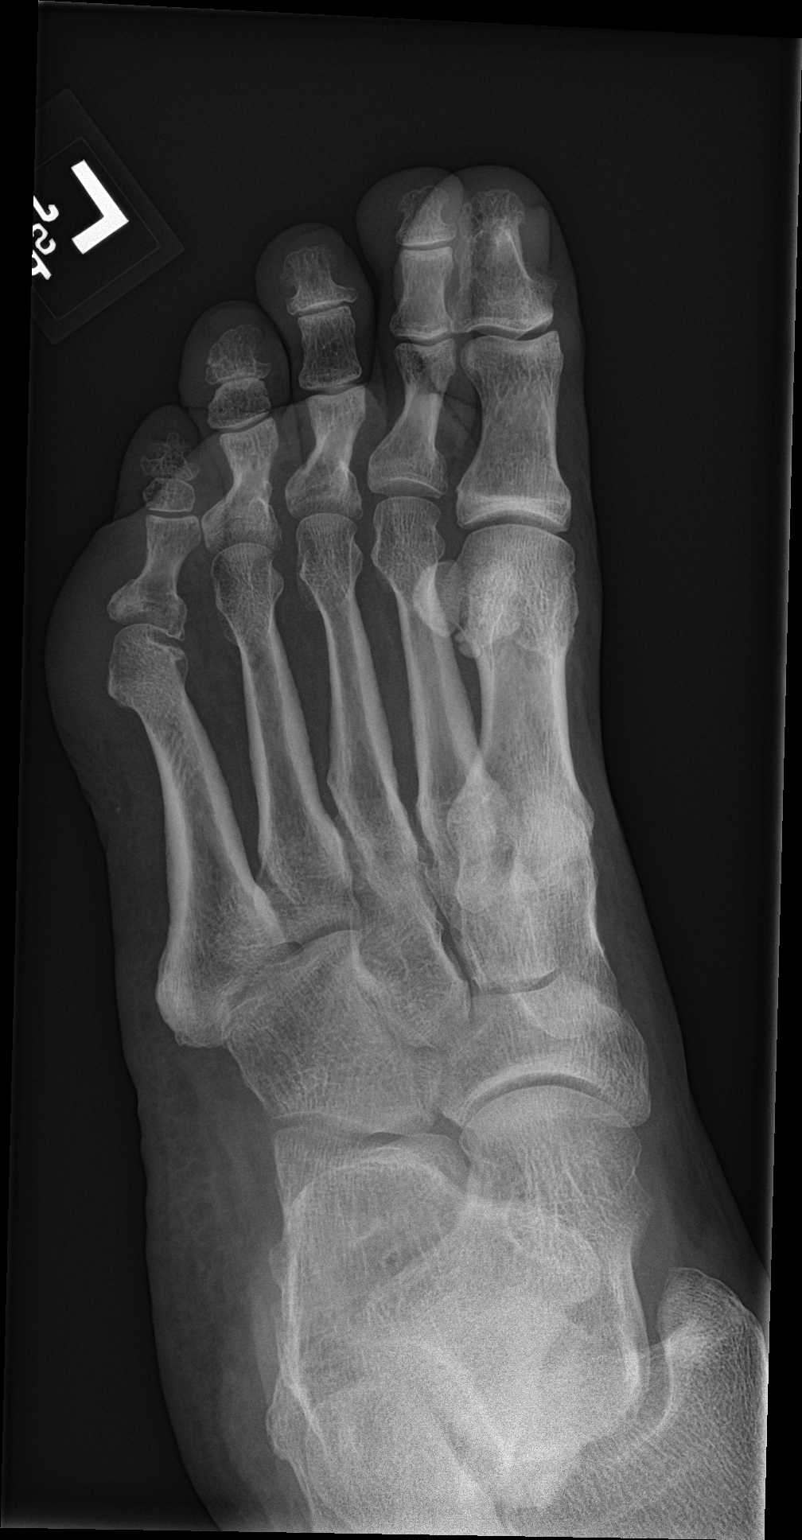

[foot lat]
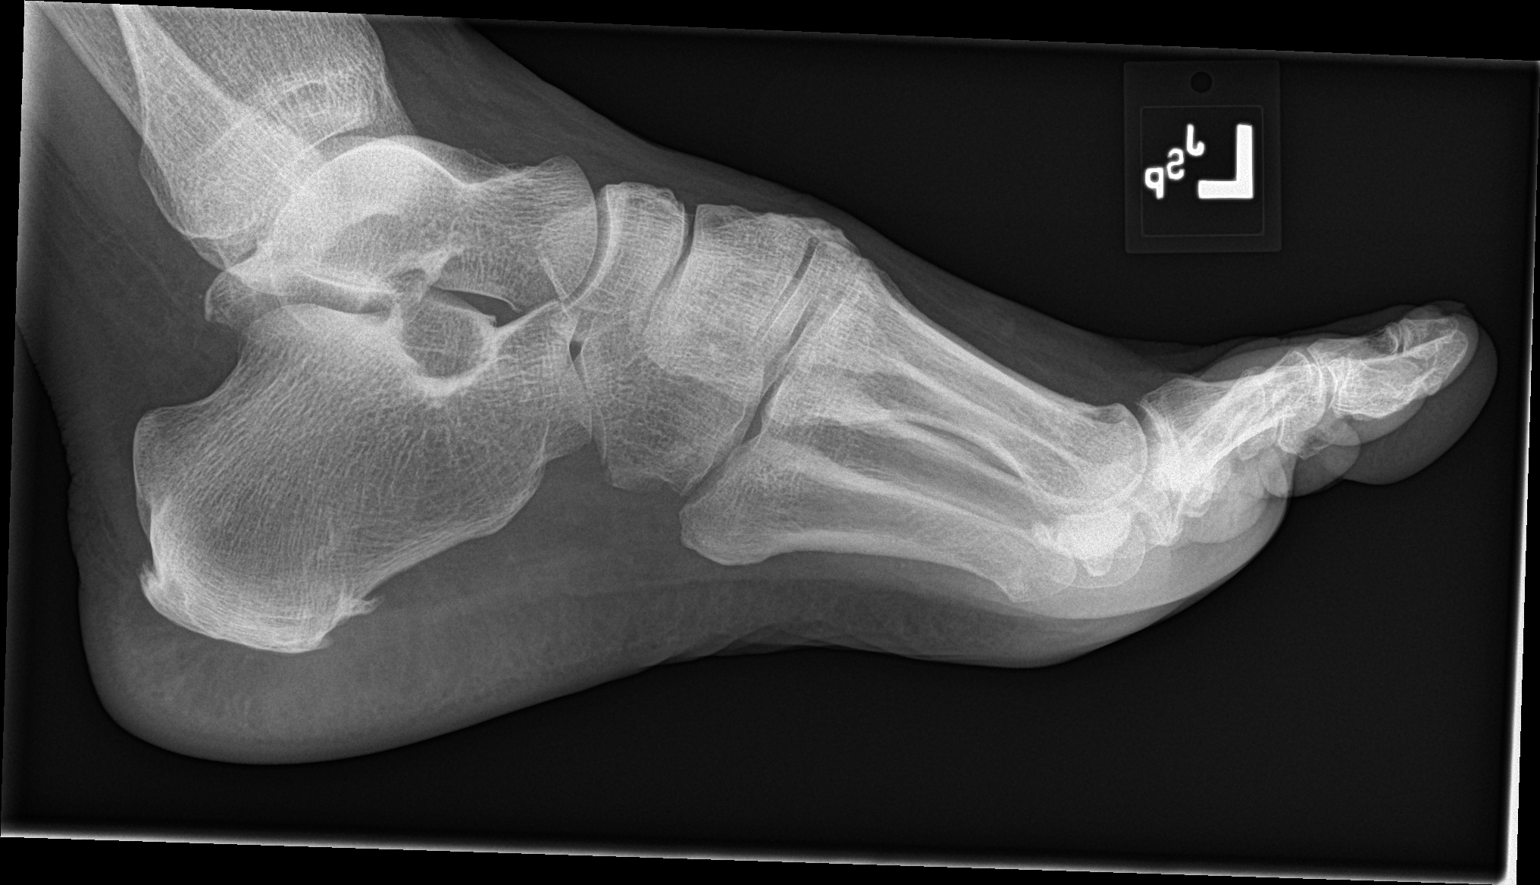

[3 of 3 positions shown; findings below may reference images not displayed]

FINDINGS: Small plantar and posterior calcaneal spurs. Joint spaces are
maintained. Possible old healed fractures at the base of the left
fifth proximal phalanx.
IMPRESSION: Small plantar and posterior calcaneal spurs.

Suspect old healed trauma at the base of the left fifth proximal
phalanx.

No acute findings.

## 2019-06-17 DIAGNOSIS — L03112 Cellulitis of left axilla: Secondary | ICD-10-CM | POA: Diagnosis not present

## 2019-06-17 DIAGNOSIS — L02412 Cutaneous abscess of left axilla: Secondary | ICD-10-CM | POA: Diagnosis not present

## 2019-06-18 ENCOUNTER — Other Ambulatory Visit: Payer: Self-pay

## 2019-06-18 ENCOUNTER — Ambulatory Visit: Payer: PPO | Admitting: Family Medicine

## 2019-06-18 ENCOUNTER — Ambulatory Visit (INDEPENDENT_AMBULATORY_CARE_PROVIDER_SITE_OTHER): Payer: PPO | Admitting: Family Medicine

## 2019-06-18 ENCOUNTER — Encounter: Payer: Self-pay | Admitting: Family Medicine

## 2019-06-18 VITALS — BP 134/78 | HR 75 | Temp 98.1°F | Ht 71.0 in | Wt 231.0 lb

## 2019-06-18 DIAGNOSIS — Z125 Encounter for screening for malignant neoplasm of prostate: Secondary | ICD-10-CM

## 2019-06-18 DIAGNOSIS — F419 Anxiety disorder, unspecified: Secondary | ICD-10-CM

## 2019-06-18 DIAGNOSIS — J301 Allergic rhinitis due to pollen: Secondary | ICD-10-CM

## 2019-06-18 DIAGNOSIS — E785 Hyperlipidemia, unspecified: Secondary | ICD-10-CM

## 2019-06-18 DIAGNOSIS — I2510 Atherosclerotic heart disease of native coronary artery without angina pectoris: Secondary | ICD-10-CM

## 2019-06-18 DIAGNOSIS — I6529 Occlusion and stenosis of unspecified carotid artery: Secondary | ICD-10-CM

## 2019-06-18 DIAGNOSIS — F3340 Major depressive disorder, recurrent, in remission, unspecified: Secondary | ICD-10-CM | POA: Diagnosis not present

## 2019-06-18 DIAGNOSIS — E559 Vitamin D deficiency, unspecified: Secondary | ICD-10-CM | POA: Diagnosis not present

## 2019-06-18 MED ORDER — FLUTICASONE PROPIONATE 50 MCG/ACT NA SUSP: NASAL | 2 refills | Status: DC

## 2019-06-18 MED ORDER — FEXOFENADINE-PSEUDOEPHED ER 180-240 MG PO TB24: 1.0000 | ORAL_TABLET | Freq: Every evening | ORAL | 11 refills | Status: DC

## 2019-06-18 MED ORDER — ATORVASTATIN CALCIUM 20 MG PO TABS: 20.0000 mg | ORAL_TABLET | Freq: Every day | ORAL | 1 refills | Status: DC

## 2019-06-18 MED ORDER — ESCITALOPRAM OXALATE 20 MG PO TABS: 20.0000 mg | ORAL_TABLET | Freq: Every day | ORAL | 1 refills | Status: DC

## 2019-06-18 NOTE — Progress Notes (Signed)
 Subjective:  Patient ID: [name], male    DOB: 12/28/1945  Age: 73 y.o. MRN: [id number]  CC: Follow-up   HPI [name] presents for follow-up of elevated cholesterol. Doing well without complaints on current medication. Denies side effects of statin including myalgia and arthralgia and nausea. Also in today for liver function testing. Currently no chest pain, shortness of breath or other cardiovascular related symptoms noted.  Patient has had a stent placed in his coronaries in 2006.  He is followed periodically by Dr. Hochrein.  Patient has been weaned off of lorazepam but continues to take Lexapro for depression.  He is using Claritin for allergic rhinitis but says that his symptoms are significant and that he is congested sneezing and having runny nose and posterior drainage.  He was noted to have prediabetes couple of years ago.  He is due for an A1c for that.  He does not follow a diet or take medicine for it. History [name] has a past medical history of Cancer (HCC), Carotid stenosis, Coronary artery disease, Degenerative joint disease, Depression with anxiety, Dyslipidemia, and Sinus bradycardia.   He has a past surgical history that includes orthopedic surgery; Coronary angioplasty with stent (2005?); Skin cancer excision; Hip arthroscopy; Hernia repair; Umbilical hernia repair; and Colonoscopy (x3-last time 10 yrs ago).   His family history includes COPD in his mother; Coronary artery disease in his father and another family member; Diabetes in his father.He reports that he quit smoking about 16 years ago. His smoking use included cigarettes. He has a 36.00 pack-year smoking history. He has never used smokeless tobacco. He reports that he does not drink alcohol or use drugs.  Current Outpatient Medications on File Prior to Visit  Medication Sig Dispense Refill  . acetaminophen (TYLENOL) 500 MG tablet Take 1,000 mg by mouth every 6 (six) hours as needed.    . aspirin EC  81 MG tablet Take 81 mg by mouth every morning.      . Cholecalciferol (VITAMIN D) 2000 UNITS CAPS Take 1 capsule by mouth daily.      . Multiple Vitamins-Minerals (MULTIVITAMIN WITH MINERALS) tablet Take 1 tablet by mouth daily.      . nitroGLYCERIN (NITROSTAT) 0.4 MG SL tablet Place 0.4 mg under the tongue every 5 (five) minutes as needed. Reported on 04/01/2015     No current facility-administered medications on file prior to visit.    ROS Review of Systems  Constitutional: Negative.   HENT: Negative.   Eyes: Negative for visual disturbance.  Respiratory: Negative for cough and shortness of breath.   Cardiovascular: Negative for chest pain and leg swelling.  Gastrointestinal: Negative for abdominal pain, diarrhea, nausea and vomiting.  Genitourinary: Negative for difficulty urinating.  Musculoskeletal: Negative for arthralgias and myalgias.  Skin: Negative for rash.  Neurological: Negative for headaches.  Psychiatric/Behavioral: Negative for sleep disturbance.    Objective:  BP 134/78   Pulse 75   Temp 98.1 F (36.7 C) (Temporal)   Ht 5' 11" (1.803 m)   Wt 231 lb (104.8 kg)   BMI 32.22 kg/m   BP Readings from Last 3 Encounters:  06/18/19 134/78  01/22/19 140/80  08/26/18 132/82    Wt Readings from Last 3 Encounters:  06/18/19 231 lb (104.8 kg)  01/22/19 238 lb (108 kg)  08/26/18 235 lb (106.6 kg)     Physical Exam Vitals reviewed.  Constitutional:      Appearance: He is well-developed.  HENT:       Head: Normocephalic and atraumatic.     Right Ear: Tympanic membrane and external ear normal. No decreased hearing noted.     Left Ear: Tympanic membrane and external ear normal. No decreased hearing noted.     Mouth/Throat:     Pharynx: No oropharyngeal exudate or posterior oropharyngeal erythema.  Eyes:     Pupils: Pupils are equal, round, and reactive to light.  Cardiovascular:     Rate and Rhythm: Normal rate and regular rhythm.     Heart sounds: No murmur.    Pulmonary:     Effort: No respiratory distress.     Breath sounds: Normal breath sounds.  Abdominal:     General: Bowel sounds are normal.     Palpations: Abdomen is soft. There is no mass.     Tenderness: There is no abdominal tenderness.  Musculoskeletal:     Cervical back: Normal range of motion and neck supple.     Lab Results  Component Value Date   HGBA1C 6.0 11/07/2016    Lab Results  Component Value Date   WBC 8.9 10/16/2006   HGB 13.5 10/16/2006   HCT 39.1 10/16/2006   PLT 231 10/16/2006   GLUCOSE 105 (H) 04/24/2017   CHOL 133 01/01/2018   TRIG 77 01/01/2018   HDL 44 01/01/2018   LDLDIRECT 67.0 06/01/2006   LDLCALC 74 01/01/2018   ALT 27 01/01/2018   AST 30 01/01/2018   NA 142 04/24/2017   K 4.9 04/24/2017   CL 103 04/24/2017   CREATININE 0.89 04/24/2017   BUN 13 04/24/2017   CO2 23 04/24/2017   TSH 1.350 11/07/2016   PSA 0.3 01/13/2013   INR 1.0 10/15/2006   HGBA1C 6.0 11/07/2016    No results found.  Assessment & Plan:   [name] was seen today for follow-up.  Diagnoses and all orders for this visit:  Hyperlipidemia with target LDL less than 100 -     CBC with Differential/Platelet -     CMP14+EGFR -     Lipid panel -     atorvastatin (LIPITOR) 20 MG tablet; Take 1 tablet (20 mg total) by mouth daily.  Recurrent major depressive disorder, in remission ([place]) -     CBC with Differential/Platelet -     CMP14+EGFR -     escitalopram (LEXAPRO) 20 MG tablet; Take 1 tablet (20 mg total) by mouth daily.  Atherosclerosis of native coronary artery of native heart without angina pectoris -     CBC with Differential/Platelet -     CMP14+EGFR  Stenosis of carotid artery, unspecified laterality -     CBC with Differential/Platelet -     CMP14+EGFR  Seasonal allergic rhinitis due to pollen -     CBC with Differential/Platelet -     CMP14+EGFR -     fluticasone (FLONASE) 50 MCG/ACT nasal spray; SPRAY 2 SPRAYS INTO EACH NOSTRIL EVERY DAY -      fexofenadine-pseudoephedrine (ALLEGRA-D 24) 180-240 MG 24 hr tablet; Take 1 tablet by mouth every evening. For allergy and congestion  Vitamin D deficiency -     VITAMIN D 25 Hydroxy (Vit-D Deficiency, Fractures)  Screening for prostate cancer -     PSA Total (Reflex To Free)  Anxiety -     escitalopram (LEXAPRO) 20 MG tablet; Take 1 tablet (20 mg total) by mouth daily.   I have discontinued Honor Loh. Foell's loratadine and LORazepam. I am also having him start on fexofenadine-pseudoephedrine. Additionally, I am having him maintain his multivitamin  with minerals, nitroGLYCERIN, Vitamin D, aspirin EC, acetaminophen, atorvastatin, escitalopram, and fluticasone.  Meds ordered this encounter  Medications  . atorvastatin (LIPITOR) 20 MG tablet    Sig: Take 1 tablet (20 mg total) by mouth daily.    Dispense:  90 tablet    Refill:  1  . escitalopram (LEXAPRO) 20 MG tablet    Sig: Take 1 tablet (20 mg total) by mouth daily.    Dispense:  90 tablet    Refill:  1  . fluticasone (FLONASE) 50 MCG/ACT nasal spray    Sig: SPRAY 2 SPRAYS INTO EACH NOSTRIL EVERY DAY    Dispense:  48 mL    Refill:  2  . fexofenadine-pseudoephedrine (ALLEGRA-D 24) 180-240 MG 24 hr tablet    Sig: Take 1 tablet by mouth every evening. For allergy and congestion    Dispense:  30 tablet    Refill:  11     Follow-up: Return in about 6 months (around 12/19/2019).  Warren Stacks, M.D. 

## 2019-06-19 LAB — CBC WITH DIFFERENTIAL/PLATELET
Basophils Absolute: 0.1 10*3/uL (ref 0.0–0.2)
Basos: 1 %
EOS (ABSOLUTE): 0.5 10*3/uL — ABNORMAL HIGH (ref 0.0–0.4)
Eos: 7 %
Hematocrit: 40.3 % (ref 37.5–51.0)
Hemoglobin: 13.8 g/dL (ref 13.0–17.7)
Immature Grans (Abs): 0 10*3/uL (ref 0.0–0.1)
Immature Granulocytes: 0 %
Lymphocytes Absolute: 3.1 10*3/uL (ref 0.7–3.1)
Lymphs: 41 %
MCH: 33.5 pg — ABNORMAL HIGH (ref 26.6–33.0)
MCHC: 34.2 g/dL (ref 31.5–35.7)
MCV: 98 fL — ABNORMAL HIGH (ref 79–97)
Monocytes Absolute: 0.6 10*3/uL (ref 0.1–0.9)
Monocytes: 9 %
Neutrophils Absolute: 3.3 10*3/uL (ref 1.4–7.0)
Neutrophils: 42 %
Platelets: 212 10*3/uL (ref 150–450)
RBC: 4.12 x10E6/uL — ABNORMAL LOW (ref 4.14–5.80)
RDW: 12.5 % (ref 11.6–15.4)
WBC: 7.5 10*3/uL (ref 3.4–10.8)

## 2019-06-19 LAB — CMP14+EGFR
ALT: 19 IU/L (ref 0–44)
AST: 30 IU/L (ref 0–40)
Albumin/Globulin Ratio: 1.5 (ref 1.2–2.2)
Albumin: 4 g/dL (ref 3.7–4.7)
Alkaline Phosphatase: 67 IU/L (ref 39–117)
BUN/Creatinine Ratio: 11 (ref 10–24)
BUN: 9 mg/dL (ref 8–27)
Bilirubin Total: 0.3 mg/dL (ref 0.0–1.2)
CO2: 25 mmol/L (ref 20–29)
Calcium: 9.7 mg/dL (ref 8.6–10.2)
Chloride: 104 mmol/L (ref 96–106)
Creatinine, Ser: 0.81 mg/dL (ref 0.76–1.27)
GFR calc Af Amer: 101 mL/min/{1.73_m2} (ref >59)
GFR calc non Af Amer: 88 mL/min/{1.73_m2} (ref >59)
Globulin, Total: 2.7 g/dL (ref 1.5–4.5)
Glucose: 119 mg/dL — ABNORMAL HIGH (ref 65–99)
Potassium: 5.3 mmol/L — ABNORMAL HIGH (ref 3.5–5.2)
Sodium: 141 mmol/L (ref 134–144)
Total Protein: 6.7 g/dL (ref 6.0–8.5)

## 2019-06-19 LAB — LIPID PANEL
Chol/HDL Ratio: 2.9 ratio (ref 0.0–5.0)
Cholesterol, Total: 134 mg/dL (ref 100–199)
HDL: 47 mg/dL (ref >39)
LDL Chol Calc (NIH): 70 mg/dL (ref 0–99)
Triglycerides: 87 mg/dL (ref 0–149)
VLDL Cholesterol Cal: 17 mg/dL (ref 5–40)

## 2019-06-19 LAB — PSA TOTAL (REFLEX TO FREE): Prostate Specific Ag, Serum: 0.2 ng/mL (ref 0.0–4.0)

## 2019-06-19 LAB — VITAMIN D 25 HYDROXY (VIT D DEFICIENCY, FRACTURES): Vit D, 25-Hydroxy: 41 ng/mL (ref 30.0–100.0)

## 2019-06-19 NOTE — Progress Notes (Signed)
Hello [name],  Your lab result is normal and/or stable.Some minor variations that are not significant are commonly marked abnormal, but do not represent any medical problem for you.  Best regards, Olamide Carattini, M.D.

## 2019-07-29 DIAGNOSIS — L0291 Cutaneous abscess, unspecified: Secondary | ICD-10-CM | POA: Diagnosis not present

## 2019-07-29 DIAGNOSIS — R7303 Prediabetes: Secondary | ICD-10-CM | POA: Diagnosis not present

## 2019-07-29 DIAGNOSIS — L02412 Cutaneous abscess of left axilla: Secondary | ICD-10-CM | POA: Diagnosis not present

## 2019-07-29 DIAGNOSIS — Z955 Presence of coronary angioplasty implant and graft: Secondary | ICD-10-CM | POA: Diagnosis not present

## 2019-07-29 DIAGNOSIS — I2510 Atherosclerotic heart disease of native coronary artery without angina pectoris: Secondary | ICD-10-CM | POA: Diagnosis not present

## 2019-07-29 DIAGNOSIS — L03112 Cellulitis of left axilla: Secondary | ICD-10-CM | POA: Diagnosis not present

## 2019-07-29 DIAGNOSIS — E785 Hyperlipidemia, unspecified: Secondary | ICD-10-CM | POA: Diagnosis not present

## 2019-07-29 DIAGNOSIS — Z7982 Long term (current) use of aspirin: Secondary | ICD-10-CM | POA: Diagnosis not present

## 2019-07-29 DIAGNOSIS — Z87891 Personal history of nicotine dependence: Secondary | ICD-10-CM | POA: Diagnosis not present

## 2019-12-18 ENCOUNTER — Other Ambulatory Visit: Payer: Self-pay | Admitting: Family Medicine

## 2019-12-18 DIAGNOSIS — F3340 Major depressive disorder, recurrent, in remission, unspecified: Secondary | ICD-10-CM

## 2019-12-18 DIAGNOSIS — E785 Hyperlipidemia, unspecified: Secondary | ICD-10-CM

## 2019-12-18 DIAGNOSIS — F419 Anxiety disorder, unspecified: Secondary | ICD-10-CM

## 2019-12-22 ENCOUNTER — Other Ambulatory Visit: Payer: Self-pay

## 2019-12-22 ENCOUNTER — Ambulatory Visit (INDEPENDENT_AMBULATORY_CARE_PROVIDER_SITE_OTHER): Payer: PPO

## 2019-12-22 ENCOUNTER — Ambulatory Visit (INDEPENDENT_AMBULATORY_CARE_PROVIDER_SITE_OTHER): Payer: PPO | Admitting: Family Medicine

## 2019-12-22 ENCOUNTER — Encounter: Payer: Self-pay | Admitting: Family Medicine

## 2019-12-22 VITALS — BP 132/75 | HR 61 | Temp 97.7°F | Ht 71.0 in | Wt 226.8 lb

## 2019-12-22 DIAGNOSIS — R0600 Dyspnea, unspecified: Secondary | ICD-10-CM | POA: Diagnosis not present

## 2019-12-22 DIAGNOSIS — E785 Hyperlipidemia, unspecified: Secondary | ICD-10-CM | POA: Diagnosis not present

## 2019-12-22 DIAGNOSIS — J449 Chronic obstructive pulmonary disease, unspecified: Secondary | ICD-10-CM | POA: Diagnosis not present

## 2019-12-22 DIAGNOSIS — I2510 Atherosclerotic heart disease of native coronary artery without angina pectoris: Secondary | ICD-10-CM

## 2019-12-22 DIAGNOSIS — F419 Anxiety disorder, unspecified: Secondary | ICD-10-CM

## 2019-12-22 DIAGNOSIS — R059 Cough, unspecified: Secondary | ICD-10-CM | POA: Diagnosis not present

## 2019-12-22 DIAGNOSIS — R0609 Other forms of dyspnea: Secondary | ICD-10-CM

## 2019-12-22 LAB — CBC WITH DIFFERENTIAL/PLATELET
Basophils Absolute: 0.1 10*3/uL (ref 0.0–0.2)
Basos: 1 %
EOS (ABSOLUTE): 0.3 10*3/uL (ref 0.0–0.4)
Eos: 4 %
Hematocrit: 42.4 % (ref 37.5–51.0)
Hemoglobin: 14.9 g/dL (ref 13.0–17.7)
Immature Grans (Abs): 0 10*3/uL (ref 0.0–0.1)
Immature Granulocytes: 0 %
Lymphocytes Absolute: 3.3 10*3/uL — ABNORMAL HIGH (ref 0.7–3.1)
Lymphs: 36 %
MCH: 33.7 pg — ABNORMAL HIGH (ref 26.6–33.0)
MCHC: 35.1 g/dL (ref 31.5–35.7)
MCV: 96 fL (ref 79–97)
Monocytes Absolute: 0.8 10*3/uL (ref 0.1–0.9)
Monocytes: 8 %
Neutrophils Absolute: 4.6 10*3/uL (ref 1.4–7.0)
Neutrophils: 51 %
Platelets: 263 10*3/uL (ref 150–450)
RBC: 4.42 x10E6/uL (ref 4.14–5.80)
RDW: 12.1 % (ref 11.6–15.4)
WBC: 9 10*3/uL (ref 3.4–10.8)

## 2019-12-22 LAB — LIPID PANEL
Chol/HDL Ratio: 2.8 ratio (ref 0.0–5.0)
Cholesterol, Total: 152 mg/dL (ref 100–199)
HDL: 55 mg/dL (ref >39)
LDL Chol Calc (NIH): 84 mg/dL (ref 0–99)
Triglycerides: 66 mg/dL (ref 0–149)
VLDL Cholesterol Cal: 13 mg/dL (ref 5–40)

## 2019-12-22 LAB — CMP14+EGFR
ALT: 26 IU/L (ref 0–44)
AST: 30 IU/L (ref 0–40)
Albumin/Globulin Ratio: 1.8 (ref 1.2–2.2)
Albumin: 4.6 g/dL (ref 3.7–4.7)
Alkaline Phosphatase: 78 IU/L (ref 44–121)
BUN/Creatinine Ratio: 15 (ref 10–24)
BUN: 14 mg/dL (ref 8–27)
Bilirubin Total: 0.4 mg/dL (ref 0.0–1.2)
CO2: 25 mmol/L (ref 20–29)
Calcium: 10.2 mg/dL (ref 8.6–10.2)
Chloride: 104 mmol/L (ref 96–106)
Creatinine, Ser: 0.92 mg/dL (ref 0.76–1.27)
GFR calc Af Amer: 94 mL/min/{1.73_m2} (ref >59)
GFR calc non Af Amer: 82 mL/min/{1.73_m2} (ref >59)
Globulin, Total: 2.5 g/dL (ref 1.5–4.5)
Glucose: 124 mg/dL — ABNORMAL HIGH (ref 65–99)
Potassium: 5.8 mmol/L — ABNORMAL HIGH (ref 3.5–5.2)
Sodium: 141 mmol/L (ref 134–144)
Total Protein: 7.1 g/dL (ref 6.0–8.5)

## 2019-12-22 MED ORDER — HYDROXYZINE HCL 10 MG PO TABS: 10.0000 mg | ORAL_TABLET | Freq: Three times a day (TID) | ORAL | 0 refills | Status: DC | PRN

## 2019-12-22 NOTE — Progress Notes (Signed)
Subjective:  Patient ID: [name], male    DOB: Oct 10, 1945  Age: 74 y.o. MRN: [id number]  CC: Follow-up (6 month)   HPI MACGREGOR AESCHLIMAN presents for follow-up of elevated cholesterol. Doing well without complaints on current medication. Denies side effects of statin including myalgia and arthralgia and nausea. Also in today for liver function testing. Currently no chest pain, shortness of breath or other cardiovascular related symptoms noted.  HAsn't used lorazepam in a couple of years has rare anxiety attack only. LExapro working well.  History Lucia has a past medical history of Cancer [facility]), Carotid stenosis, Coronary artery disease, Degenerative joint disease, Depression with anxiety, Dyslipidemia, and Sinus bradycardia.   He has a past surgical history that includes orthopedic surgery; Coronary angioplasty with stent (2005?); Skin cancer excision; Hip arthroscopy; Hernia repair; Umbilical hernia repair; and Colonoscopy (x3-last time 10 yrs ago).   His family history includes COPD in his mother; Coronary artery disease in his father and another family member; Diabetes in his father.He reports that he quit smoking about 16 years ago. His smoking use included cigarettes. He has a 36.00 pack-year smoking history. He has never used smokeless tobacco. He reports that he does not drink alcohol and does not use drugs.  Current Outpatient Medications on File Prior to Visit  Medication Sig Dispense Refill   acetaminophen (TYLENOL) 500 MG tablet Take 1,000 mg by mouth every 6 (six) hours as needed.     aspirin EC 81 MG tablet Take 81 mg by mouth every morning.       atorvastatin (LIPITOR) 20 MG tablet TAKE 1 TABLET BY MOUTH EVERY DAY 90 tablet 1   Cholecalciferol (VITAMIN D) 2000 UNITS CAPS Take 1 capsule by mouth daily.       escitalopram (LEXAPRO) 20 MG tablet TAKE 1 TABLET BY MOUTH EVERY DAY 90 tablet 1   fluticasone (FLONASE) 50 MCG/ACT nasal spray SPRAY 2 SPRAYS INTO EACH NOSTRIL  EVERY DAY 48 mL 2   loratadine (CLARITIN) 10 MG tablet Take 10 mg by mouth daily.     Multiple Vitamins-Minerals (MULTIVITAMIN WITH MINERALS) tablet Take 1 tablet by mouth daily.       nitroGLYCERIN (NITROSTAT) 0.4 MG SL tablet Place 0.4 mg under the tongue every 5 (five) minutes as needed. Reported on 04/01/2015     No current facility-administered medications on file prior to visit.    ROS Review of Systems  Constitutional: Negative.   HENT: Negative.   Eyes: Negative for visual disturbance.  Respiratory: Positive for shortness of breath (with moderate exertion. Former smoker. Stopped 15 years ago). Negative for cough.   Cardiovascular: Negative for chest pain and leg swelling.  Gastrointestinal: Negative for abdominal pain, diarrhea, nausea and vomiting.  Genitourinary: Negative for difficulty urinating.  Musculoskeletal: Negative for arthralgias and myalgias.  Skin: Negative for rash.  Neurological: Negative for headaches.  Psychiatric/Behavioral: Negative for sleep disturbance.    Objective:  BP 132/75    Pulse 61    Temp 97.7 F (36.5 C) (Temporal)    Ht 5' 11"  (1.803 m)    Wt 226 lb 12.8 oz (102.9 kg)    BMI 31.63 kg/m   BP Readings from Last 3 Encounters:  12/22/19 132/75  06/18/19 134/78  01/22/19 140/80    Wt Readings from Last 3 Encounters:  12/22/19 226 lb 12.8 oz (102.9 kg)  06/18/19 231 lb (104.8 kg)  01/22/19 238 lb (108 kg)     Physical Exam Vitals reviewed.  Constitutional:  Appearance: He is well-developed.  HENT:     Head: Normocephalic and atraumatic.     Right Ear: Tympanic membrane and external ear normal. No decreased hearing noted.     Left Ear: Tympanic membrane and external ear normal. No decreased hearing noted.     Mouth/Throat:     Pharynx: No oropharyngeal exudate or posterior oropharyngeal erythema.  Eyes:     Pupils: Pupils are equal, round, and reactive to light.  Cardiovascular:     Rate and Rhythm: Normal rate and regular  rhythm.     Heart sounds: No murmur heard.   Pulmonary:     Effort: No respiratory distress.     Breath sounds: Normal breath sounds.  Abdominal:     General: Bowel sounds are normal.     Palpations: Abdomen is soft. There is no mass.     Tenderness: There is no abdominal tenderness.  Musculoskeletal:     Cervical back: Normal range of motion and neck supple.     Lab Results  Component Value Date   HGBA1C 6.0 11/07/2016    Lab Results  Component Value Date   WBC 7.5 06/18/2019   HGB 13.8 06/18/2019   HCT 40.3 06/18/2019   PLT 212 06/18/2019   GLUCOSE 119 (H) 06/18/2019   CHOL 134 06/18/2019   TRIG 87 06/18/2019   HDL 47 06/18/2019   LDLDIRECT 67.0 06/01/2006   LDLCALC 70 06/18/2019   ALT 19 06/18/2019   AST 30 06/18/2019   NA 141 06/18/2019   K 5.3 (H) 06/18/2019   CL 104 06/18/2019   CREATININE 0.81 06/18/2019   BUN 9 06/18/2019   CO2 25 06/18/2019   TSH 1.350 11/07/2016   PSA 0.3 01/13/2013   INR 1.0 10/15/2006   HGBA1C 6.0 11/07/2016    No results found.  Assessment & Plan:   Rydell was seen today for follow-up.  Diagnoses and all orders for this visit:  DOE (dyspnea on exertion) -     CMP14+EGFR -     CBC with Differential/Platelet -     DG Chest 2 View; Future -     Lipid panel  Hyperlipidemia with target LDL less than 100 -     CMP14+EGFR -     CBC with Differential/Platelet -     Lipid panel  Atherosclerosis of native coronary artery of native heart without angina pectoris -     CMP14+EGFR -     CBC with Differential/Platelet -     DG Chest 2 View; Future -     Lipid panel  Anxiety -     CMP14+EGFR -     CBC with Differential/Platelet -     Lipid panel  Other orders -     hydrOXYzine (ATARAX/VISTARIL) 10 MG tablet; Take 1 tablet (10 mg total) by mouth 3 (three) times daily as needed for anxiety.   I have discontinued Honor Loh. Brunelle's fexofenadine-pseudoephedrine, Fluzone High-Dose Quadrivalent, and LORazepam. I am also having him  start on hydrOXYzine. Additionally, I am having him maintain his multivitamin with minerals, nitroGLYCERIN, Vitamin D, aspirin EC, acetaminophen, fluticasone, atorvastatin, escitalopram, and loratadine.  Meds ordered this encounter  Medications   hydrOXYzine (ATARAX/VISTARIL) 10 MG tablet    Sig: Take 1 tablet (10 mg total) by mouth 3 (three) times daily as needed for anxiety.    Dispense:  30 tablet    Refill:  0     Follow-up: Return in about 6 months (around 06/20/2020).  Claretta Fraise, M.D.

## 2019-12-23 ENCOUNTER — Other Ambulatory Visit: Payer: Self-pay | Admitting: *Deleted

## 2019-12-23 DIAGNOSIS — E875 Hyperkalemia: Secondary | ICD-10-CM

## 2019-12-24 ENCOUNTER — Other Ambulatory Visit: Payer: PPO

## 2019-12-24 ENCOUNTER — Other Ambulatory Visit: Payer: Self-pay

## 2019-12-24 DIAGNOSIS — E875 Hyperkalemia: Secondary | ICD-10-CM | POA: Diagnosis not present

## 2019-12-24 LAB — POTASSIUM: Potassium: 5.1 mmol/L (ref 3.5–5.2)

## 2020-04-13 ENCOUNTER — Other Ambulatory Visit: Payer: Self-pay | Admitting: Family Medicine

## 2020-04-13 DIAGNOSIS — E785 Hyperlipidemia, unspecified: Secondary | ICD-10-CM

## 2020-04-13 DIAGNOSIS — F419 Anxiety disorder, unspecified: Secondary | ICD-10-CM

## 2020-04-13 DIAGNOSIS — J301 Allergic rhinitis due to pollen: Secondary | ICD-10-CM

## 2020-04-13 DIAGNOSIS — F3340 Major depressive disorder, recurrent, in remission, unspecified: Secondary | ICD-10-CM

## 2020-05-25 ENCOUNTER — Ambulatory Visit (INDEPENDENT_AMBULATORY_CARE_PROVIDER_SITE_OTHER): Payer: PPO | Admitting: Family Medicine

## 2020-05-25 ENCOUNTER — Encounter: Payer: Self-pay | Admitting: Family Medicine

## 2020-05-25 ENCOUNTER — Other Ambulatory Visit: Payer: Self-pay | Admitting: Family Medicine

## 2020-05-25 DIAGNOSIS — J302 Other seasonal allergic rhinitis: Secondary | ICD-10-CM

## 2020-05-25 MED ORDER — LEVOCETIRIZINE DIHYDROCHLORIDE 5 MG PO TABS: 5.0000 mg | ORAL_TABLET | Freq: Every evening | ORAL | 2 refills | Status: DC

## 2020-05-25 MED ORDER — AZELASTINE-FLUTICASONE 137-50 MCG/ACT NA SUSP: 2.0000 | Freq: Two times a day (BID) | NASAL | 2 refills | Status: DC

## 2020-05-25 NOTE — Progress Notes (Signed)
Virtual Visit via Telephone Note  I connected with Merlene Morse on 05/25/20 at 1:59 PM by telephone and verified that I am speaking with the correct person using two identifiers. SHAYON TROMPETER is currently located in [facility] and cousin is currently with him during this visit. The provider, Loman Brooklyn, FNP is located in their home at time of visit.  I discussed the limitations, risks, security and privacy concerns of performing an evaluation and management service by telephone and the availability of in person appointments. I also discussed with the patient that there may be a patient responsible charge related to this service. The patient expressed understanding and agreed to proceed.  Subjective: PCP: Claretta Fraise, MD  Chief Complaint  Patient presents with  . URI   Patient complains of headaches in the morning, ear pain, runny nose, and watery eyes for the past 4-6 weeks. He has been using eye drops, Flonase, and Claritin daily. He has been on these for a long time.    ROS: Per HPI  Current Outpatient Medications:  .  acetaminophen (TYLENOL) 500 MG tablet, Take 1,000 mg by mouth every 6 (six) hours as needed., Disp: , Rfl:  .  aspirin EC 81 MG tablet, Take 81 mg by mouth every morning.  , Disp: , Rfl:  .  atorvastatin (LIPITOR) 20 MG tablet, TAKE 1 TABLET BY MOUTH EVERY DAY, Disp: 90 tablet, Rfl: 0 .  Cholecalciferol (VITAMIN D) 2000 UNITS CAPS, Take 1 capsule by mouth daily.  , Disp: , Rfl:  .  escitalopram (LEXAPRO) 20 MG tablet, TAKE 1 TABLET BY MOUTH EVERY DAY, Disp: 90 tablet, Rfl: 0 .  fluticasone (FLONASE) 50 MCG/ACT nasal spray, SPRAY 2 SPRAYS INTO EACH NOSTRIL EVERY DAY, Disp: 48 mL, Rfl: 2 .  hydrOXYzine (ATARAX/VISTARIL) 10 MG tablet, Take 1 tablet (10 mg total) by mouth 3 (three) times daily as needed for anxiety., Disp: 30 tablet, Rfl: 0 .  loratadine (CLARITIN) 10 MG tablet, TAKE 1 TABLET BY MOUTH EVERY DAY, Disp: 90 tablet, Rfl: 0 .  Multiple  Vitamins-Minerals (MULTIVITAMIN WITH MINERALS) tablet, Take 1 tablet by mouth daily.  , Disp: , Rfl:  .  nitroGLYCERIN (NITROSTAT) 0.4 MG SL tablet, Place 0.4 mg under the tongue every 5 (five) minutes as needed. Reported on 04/01/2015, Disp: , Rfl:   No Known Allergies Past Medical History:  Diagnosis Date  . Cancer (HCC)    Skin  . Carotid stenosis   . Coronary artery disease    s/p Taxus stenting to the LAD.  This was 2005. Catheterization in 2008, demonstrated a proximal calcification with ostial 40-50% stenosis, it was a widely patent stent, the circumflex had 40%  proximal stenosis,  the right coronary artery is large and dominant with minor nonobstructive plaque, the EF was 55%.  . Degenerative joint disease   . Depression with anxiety   . Dyslipidemia   . Sinus bradycardia     Observations/Objective: A&O  No respiratory distress or wheezing audible over the phone Mood, judgement, and thought processes all WNL  Assessment and Plan: 1. Seasonal allergies Uncontrolled. Switching Flonase to Dym[place] and Claritin to [place] to let us know if this does not help.  - Azelastine-Fluticasone (DYMISTA) 137-50 MCG/ACT SUSP; Place 2 sprays into the nose every 12 (twelve) hours.  Dispense: 23 g; Refill: 2 - levocetirizine (XYZAL) 5 MG tablet; Take 1 tablet (5 mg total) by mouth every evening.  Dispense: 30 tablet; Refill: 2   Follow  Up Instructions:  I discussed the assessment and treatment plan with the patient. The patient was provided an opportunity to ask questions and all were answered. The patient agreed with the plan and demonstrated an understanding of the instructions.   The patient was advised to call back or seek an in-person evaluation if the symptoms worsen or if the condition fails to improve as anticipated.  The above assessment and management plan was discussed with the patient. The patient verbalized understanding of and has agreed to the management plan. Patient is  aware to call the clinic if symptoms persist or worsen. Patient is aware when to return to the clinic for a follow-up visit. Patient educated on when it is appropriate to go to the emergency department.   Time call ended: 2:05 PM  I provided 6 minutes of non-face-to-face time during this encounter.  Hendricks Limes, MSN, APRN, FNP-C [facility] 05/25/20

## 2020-05-27 NOTE — Telephone Encounter (Signed)
Key: [id number] Azelastine-Fluticasone 137-50MCG/ACT suspension Sent to Plan today

## 2020-05-31 ENCOUNTER — Other Ambulatory Visit: Payer: Self-pay | Admitting: Family Medicine

## 2020-05-31 DIAGNOSIS — J302 Other seasonal allergic rhinitis: Secondary | ICD-10-CM

## 2020-05-31 NOTE — Telephone Encounter (Signed)
Approvedon April 16 [place] Case: [id number], Status: Approved, Coverage Starts on: 05/29/2020 12:00:00 AM, Coverage Ends on: 02/12/2021 12:00:00 AM.    Pharmacy aware.

## 2020-06-01 MED ORDER — AZELASTINE-FLUTICASONE 137-50 MCG/ACT NA SUSP: 2.0000 | Freq: Two times a day (BID) | NASAL | 2 refills | Status: DC

## 2020-06-01 NOTE — Addendum Note (Signed)
Addended by: Antonietta Barcelona D on: 06/01/2020 12:42 PM   Modules accepted: Orders

## 2020-06-01 NOTE — Telephone Encounter (Signed)
Refill failed. resent °

## 2020-06-28 ENCOUNTER — Other Ambulatory Visit: Payer: Self-pay | Admitting: Family Medicine

## 2020-06-28 DIAGNOSIS — J301 Allergic rhinitis due to pollen: Secondary | ICD-10-CM

## 2020-07-10 ENCOUNTER — Other Ambulatory Visit: Payer: Self-pay | Admitting: Family Medicine

## 2020-07-10 DIAGNOSIS — J301 Allergic rhinitis due to pollen: Secondary | ICD-10-CM

## 2020-08-09 DIAGNOSIS — H40033 Anatomical narrow angle, bilateral: Secondary | ICD-10-CM | POA: Diagnosis not present

## 2020-08-09 DIAGNOSIS — H2513 Age-related nuclear cataract, bilateral: Secondary | ICD-10-CM | POA: Diagnosis not present

## 2020-08-18 ENCOUNTER — Other Ambulatory Visit: Payer: Self-pay | Admitting: Family Medicine

## 2020-08-18 DIAGNOSIS — E785 Hyperlipidemia, unspecified: Secondary | ICD-10-CM

## 2020-08-18 DIAGNOSIS — F419 Anxiety disorder, unspecified: Secondary | ICD-10-CM

## 2020-08-18 DIAGNOSIS — F3340 Major depressive disorder, recurrent, in remission, unspecified: Secondary | ICD-10-CM

## 2020-09-01 ENCOUNTER — Other Ambulatory Visit: Payer: Self-pay | Admitting: Family Medicine

## 2020-09-01 DIAGNOSIS — E785 Hyperlipidemia, unspecified: Secondary | ICD-10-CM

## 2020-09-01 DIAGNOSIS — F419 Anxiety disorder, unspecified: Secondary | ICD-10-CM

## 2020-09-01 DIAGNOSIS — F3340 Major depressive disorder, recurrent, in remission, unspecified: Secondary | ICD-10-CM

## 2020-09-26 ENCOUNTER — Other Ambulatory Visit: Payer: Self-pay | Admitting: Family Medicine

## 2020-09-26 DIAGNOSIS — J301 Allergic rhinitis due to pollen: Secondary | ICD-10-CM

## 2020-09-26 DIAGNOSIS — J302 Other seasonal allergic rhinitis: Secondary | ICD-10-CM

## 2020-10-19 ENCOUNTER — Other Ambulatory Visit: Payer: Self-pay | Admitting: Family Medicine

## 2020-10-19 DIAGNOSIS — F3340 Major depressive disorder, recurrent, in remission, unspecified: Secondary | ICD-10-CM

## 2020-10-19 DIAGNOSIS — F419 Anxiety disorder, unspecified: Secondary | ICD-10-CM

## 2020-10-19 DIAGNOSIS — E785 Hyperlipidemia, unspecified: Secondary | ICD-10-CM

## 2020-10-21 ENCOUNTER — Ambulatory Visit (INDEPENDENT_AMBULATORY_CARE_PROVIDER_SITE_OTHER): Payer: PPO

## 2020-10-21 VITALS — Ht 71.0 in | Wt 225.0 lb

## 2020-10-21 DIAGNOSIS — Z0000 Encounter for general adult medical examination without abnormal findings: Secondary | ICD-10-CM | POA: Diagnosis not present

## 2020-10-21 NOTE — Progress Notes (Signed)
Subjective:   [name] is a 75 y.o. male who presents for Medicare Annual/Subsequent preventive examination.  Virtual Visit via Telephone Note  I connected with  [name] on 10/21/20 at  2:45 PM EDT by telephone and verified that I am speaking with the correct person using two identifiers.  Location: Patient: Home Provider: [facility] Persons participating in the virtual visit: patient/Nurse Health Advisor   I discussed the limitations, risks, security and privacy concerns of performing an evaluation and management service by telephone and the availability of in person appointments. The patient expressed understanding and agreed to proceed.  Interactive audio and video telecommunications were attempted between this nurse and patient, however failed, due to patient having technical difficulties OR patient did not have access to video capability.  We continued and completed visit with audio only.  Some vital signs may be absent or patient reported.   Zaydin Billey E Ourania Hamler, LPN   Review of Systems     Cardiac Risk Factors include: advanced age (>52mn, >>74women);male gender;obesity (BMI >30kg/m2);dyslipidemia     Objective:    Today's Vitals   10/21/20 1453 10/21/20 1455  Weight: 225 lb (102.1 kg)   Height: '5\' 11"'$  (1.803 m)   PainSc:  3    Body mass index is 31.38 kg/m.  Advanced Directives 10/21/2020 04/23/2019 04/27/2014  Does Patient Have a Medical Advance Directive? No No No  Would patient like information on creating a medical advance directive? No - Patient declined No - Patient declined No - patient declined information    Current Medications (verified) Outpatient Encounter Medications as of 10/21/2020  Medication Sig   acetaminophen (TYLENOL) 500 MG tablet Take 1,000 mg by mouth every 6 (six) hours as needed.   aspirin EC 81 MG tablet Take 81 mg by mouth every morning.     atorvastatin (LIPITOR) 20 MG tablet Take 1 tablet (20 mg total) by mouth daily. (NEEDS TO BE SEEN  BEFORE NEXT REFILL)   Azelastine-Fluticasone 137-50 MCG/ACT SUSP Place 2 sprays into the nose every 12 (twelve) hours.   Cholecalciferol (VITAMIN D) 2000 UNITS CAPS Take 1 capsule by mouth daily.     escitalopram (LEXAPRO) 20 MG tablet TAKE 1 TABLET BY MOUTH EVERY DAY   hydrOXYzine (ATARAX/VISTARIL) 10 MG tablet Take 1 tablet (10 mg total) by mouth 3 (three) times daily as needed for anxiety.   levocetirizine (XYZAL) 5 MG tablet TAKE 1 TABLET BY MOUTH EVERY DAY IN THE EVENING   Multiple Vitamins-Minerals (MULTIVITAMIN WITH MINERALS) tablet Take 1 tablet by mouth daily.     nitroGLYCERIN (NITROSTAT) 0.4 MG SL tablet Place 0.4 mg under the tongue every 5 (five) minutes as needed. Reported on 04/01/2015   No facility-administered encounter medications on file as of 10/21/2020.    Allergies (verified) Patient has no known allergies.   History: Past Medical History:  Diagnosis Date   Cancer ([facility]    Skin   Carotid stenosis    Coronary artery disease    s/p Taxus stenting to the LAD.  This was 2005. Catheterization in 2008, demonstrated a proximal calcification with ostial 40-50% stenosis, it was a widely patent stent, the circumflex had 40%  proximal stenosis,  the right coronary artery is large and dominant with minor nonobstructive plaque, the EF was 55%.   Degenerative joint disease    Depression with anxiety    Dyslipidemia    Sinus bradycardia    Past Surgical History:  Procedure Laterality Date   COLONOSCOPY  x3-last  time 10 yrs ago   [facility]  2005?   HERNIA REPAIR     HIP ARTHROSCOPY     ORTHOPEDIC SURGERY     right foot and right hip secondary to DJD   SKIN CANCER EXCISION     UMBILICAL HERNIA REPAIR     Family History  Problem Relation Age of Onset   COPD Mother    Coronary artery disease Father        stent   Diabetes Father    Coronary artery disease Other        s/p MI   Colon cancer Neg Hx    Esophageal cancer Neg Hx     Rectal cancer Neg Hx    Stomach cancer Neg Hx    Colon polyps Neg Hx    Social History   Socioeconomic History   Marital status: Married    Spouse name: Not on file   Number of children: 3   Years of education: 7th Grade   Highest education level: 7th grade  Occupational History   Occupation: Retired    Comment: Clinical biochemist  Tobacco Use   Smoking status: Former    Packs/day: 1.00    Years: 36.00    Pack years: 36.00    Types: Cigarettes    Quit date: 02/14/2003    Years since quitting: 17.6   Smokeless tobacco: Never  Vaping Use   Vaping Use: Never used  Substance and Sexual Activity   Alcohol use: No    Alcohol/week: 0.0 standard drinks    Comment: quit completely in 2005   Drug use: No   Sexual activity: Not Currently  Other Topics Concern   Not on file  Social History Narrative   Lives at home with Daughter Judeen Hammans) and Yolanda Bonine (Chaz)-he is still legally married but has lived separate from wife for over 23 years   Social Determinants of Radio broadcast assistant Strain: Low Risk    Difficulty of Paying Living Expenses: Not hard at all  Food Insecurity: No Food Insecurity   Worried About Charity fundraiser in the Last Year: Never true   Arboriculturist in the Last Year: Never true  Transportation Needs: No Transportation Needs   Lack of Transportation (Medical): No   Lack of Transportation (Non-Medical): No  Physical Activity: Sufficiently Active   Days of Exercise per Week: 7 days   Minutes of Exercise per Session: 30 min  Stress: No Stress Concern Present   Feeling of Stress : Only a little  Social Connections: Moderately Isolated   Frequency of Communication with Friends and Family: More than three times a week   Frequency of Social Gatherings with Friends and Family: More than three times a week   Attends Religious Services: Never   Marine scientist or Organizations: No   Attends Music therapist: Never   Marital Status: Married     Tobacco Counseling Counseling given: Not Answered   Clinical Intake:  Pre-visit preparation completed: Yes  Pain : 0-10 Pain Score: 3  Pain Type: Chronic pain Pain Location: Generalized Pain Descriptors / Indicators: Aching, Discomfort Pain Onset: More than a month ago Pain Frequency: Intermittent     BMI - recorded: 31.38 Nutritional Status: BMI > 30  Obese Nutritional Risks: None Diabetes: No  How often do you need to have someone help you when you read instructions, pamphlets, or other written materials from your doctor or pharmacy?:  1 - Never  Diabetic? no  Interpreter Needed?: No  Information entered by :: Chandler Stofer   Activities of Daily Living In your present state of health, do you have any difficulty performing the following activities: 10/21/2020  Hearing? N  Vision? N  Difficulty concentrating or making decisions? N  Walking or climbing stairs? N  Dressing or bathing? N  Doing errands, shopping? N  Preparing Food and eating ? N  Using the Toilet? N  In the past six months, have you accidently leaked urine? N  Do you have problems with loss of bowel control? N  Managing your Medications? N  Managing your Finances? N  Housekeeping or managing your Housekeeping? N  Some recent data might be hidden    Patient Care Team: Claretta Fraise, MD as PCP - General (Family Medicine)  Indicate any recent Medical Services you may have received from other than [facility] providers in the past year (date may be approximate).     Assessment:   This is a routine wellness examination for [facility].  Hearing/Vision screen Hearing Screening - Comments:: Denies hearing difficulties  Vision Screening - Comments:: Wears glasses- up to date with annual eye exams with Dr Marin Comment in [place]  Dietary issues and exercise activities discussed: Current Exercise Habits: Home exercise routine, Type of exercise: walking;Other - see comments (outdoor work, farm work), Time (Minutes): 30,  Frequency (Times/Week): 5, Weekly Exercise (Minutes/Week): 150, Intensity: Moderate   Goals Addressed             This Visit's Progress    DIET - INCREASE WATER INTAKE   On track    Try to drink 6-8 glasses of water daily     Exercise 3x per week (30 min per time)   On track    Try to exercise for at least 30 minutes, 3 times weekly Walking Stationary Bike Hand Weights       Depression Screen PHQ 2/9 Scores 10/21/2020 12/22/2019 06/18/2019 04/23/2019 04/22/2018 03/04/2018 04/24/2017  PHQ - 2 Score 0 0 0 0 0 0 0  PHQ- 9 Score - - - 0 - - -    Fall Risk Fall Risk  10/21/2020 12/22/2019 06/18/2019 04/23/2019 04/22/2018  Falls in the past year? 0 0 0 0 0  Number falls in past yr: 0 0 0 - -  Injury with Fall? 0 0 0 - -  Comment - - - - -  Risk for fall due to : Impaired vision No Fall Risks No Fall Risks - -  Follow up Falls prevention discussed Falls evaluation completed Falls evaluation completed - -    FALL RISK PREVENTION PERTAINING TO THE HOME:  Any stairs in or around the home? Yes  If so, are there any without handrails? No  Home free of loose throw rugs in walkways, pet beds, electrical cords, etc? Yes  Adequate lighting in your home to reduce risk of falls? Yes   ASSISTIVE DEVICES UTILIZED TO PREVENT FALLS:  Life alert? No  Use of a cane, walker or w/c? No  Grab bars in the bathroom? No  Shower chair or bench in shower? No  Elevated toilet seat or a handicapped toilet? No   TIMED UP AND GO:  Was the test performed? No . Telephonic visit  Cognitive Function: Normal cognitive status assessed by direct observation by this Nurse Health Advisor. No abnormalities found.   MMSE - Mini Mental State Exam 04/22/2018  Orientation to time 5  Orientation to Place 5  Registration  3  Attention/ Calculation 4  Recall 3  Language- name 2 objects 2  Language- repeat 1  Language- follow 3 step command 3  Language- read & follow direction 1  Write a sentence 1  Copy design 1  Total  score 29     6CIT Screen 04/23/2019  What Year? 0 points  What month? 0 points  What time? 0 points  Count back from 20 0 points  Months in reverse 0 points  Repeat phrase 2 points  Total Score 2    Immunizations Immunization History  Administered Date(s) Administered   Fluad Quad(high Dose 65+) 11/26/2018   Influenza Inj Mdck Quad Pf 11/26/2018   Influenza, High Dose Seasonal PF 01/03/2016, 12/11/2016, 11/26/2017   Influenza,inj,Quad PF,6+ Mos 12/10/2012, 11/19/2013, 12/09/2014   Influenza-Unspecified 12/08/2019   Moderna Sars-Covid-2 Vaccination 03/25/2019, 04/22/2019, 01/15/2020   Pneumococcal Conjugate-13 10/27/2013   Pneumococcal Polysaccharide-23 06/09/2015   Tdap 01/27/2011   Zoster Recombinat (Shingrix) 04/22/2018    TDAP status: Up to date  Flu Vaccine status: Up to date  Pneumococcal vaccine status: Up to date  Covid-19 vaccine status: Completed vaccines  Qualifies for Shingles Vaccine? Yes   Zostavax completed Yes   Shingrix Completed?: No.    Education has been provided regarding the importance of this vaccine. Patient has been advised to call insurance company to determine out of pocket expense if they have not yet received this vaccine. Advised may also receive vaccine at local pharmacy or Health Dept. Verbalized acceptance and understanding.  Screening Tests Health Maintenance  Topic Date Due   Zoster Vaccines- Shingrix (2 of 2) 06/17/2018   COLON CANCER SCREENING ANNUAL FOBT  09/04/2019   COVID-19 Vaccine (4 - Booster for Moderna series) 04/08/2020   INFLUENZA VACCINE  09/13/2020   TETANUS/TDAP  01/26/2021   COLONOSCOPY (Pts 45-36yr Insurance coverage will need to be confirmed)  08/25/2021   Hepatitis C Screening  Completed   PNA vac Low Risk Adult  Completed   HPV VACCINES  Aged Out    Health Maintenance  Health Maintenance Due  Topic Date Due   Zoster Vaccines- Shingrix (2 of 2) 06/17/2018   COLON CANCER SCREENING ANNUAL FOBT  09/04/2019    COVID-19 Vaccine (4 - Booster for Moderna series) 04/08/2020   INFLUENZA VACCINE  09/13/2020    Colorectal cancer screening: Type of screening: Colonoscopy. Completed 08/26/2018. Repeat every 3 years  Lung Cancer Screening: (Low Dose CT Chest recommended if Age 76-80years, 30 pack-year currently smoking OR have quit w/in 15years.) does not qualify.   Additional Screening:  Hepatitis C Screening: does qualify; Completed 12/07/2014  Vision Screening: Recommended annual ophthalmology exams for early detection of glaucoma and other disorders of the eye. Is the patient up to date with their annual eye exam?  Yes  Who is the provider or what is the name of the office in which the patient attends annual eye exams? [facility]If pt is not established with a provider, would they like to be referred to a provider to establish care? No .   Dental Screening: Recommended annual dental exams for proper oral hygiene  Community Resource Referral / Chronic Care Management: CRR required this visit?  No   CCM required this visit?  No      Plan:     I have personally reviewed and noted the following in the patient's chart:   Medical and social history Use of alcohol, tobacco or illicit drugs  Current medications and supplements including opioid prescriptions.  Patient is not currently taking opioid prescriptions. Functional ability and status Nutritional status Physical activity Advanced directives List of other physicians Hospitalizations, surgeries, and ER visits in previous 12 months Vitals Screenings to include cognitive, depression, and falls Referrals and appointments  In addition, I have reviewed and discussed with patient certain preventive protocols, quality metrics, and best practice recommendations. A written personalized care plan for preventive services as well as general preventive health recommendations were provided to patient.     Sandrea Hammond, LPN   [id number]   Nurse Notes:  None

## 2020-10-21 NOTE — Patient Instructions (Signed)
[name] , Thank you for taking time to come for your Medicare Wellness Visit. I appreciate your ongoing commitment to your health goals. Please review the following plan we discussed and let me know if I can assist you in the future.   Screening recommendations/referrals: Colonoscopy: Done 08/26/2018 - Repeat in 3 years Recommended yearly ophthalmology/optometry visit for glaucoma screening and checkup Recommended yearly dental visit for hygiene and checkup  Vaccinations: Influenza vaccine: Done 12/08/2019 - Repeat annually  Pneumococcal vaccine: Done 10/27/2013 & 06/09/2015 Tdap vaccine: Done 01/27/2011 - Repeat in 10 years  Shingles vaccine: One dose done 04/22/2018 - due for second dose (double check with pharmacy)   Covid-19: Done 03/25/19, 04/22/19, 01/15/2020 - due for second booster  Advanced directives: Please bring a copy of your health care power of attorney and living will to the office to be added to your chart at your convenience.   Conditions/risks identified: Aim for 30 minutes of exercise or brisk walking each day, drink 6-8 glasses of water and eat lots of fruits and vegetables.   Next appointment: Follow up in one year for your annual wellness visit.   Preventive Care 17 Years and Older, Male  Preventive care refers to lifestyle choices and visits with your health care provider that can promote health and wellness. What does preventive care include? A yearly physical exam. This is also called an annual well check. Dental exams once or twice a year. Routine eye exams. Ask your health care provider how often you should have your eyes checked. Personal lifestyle choices, including: Daily care of your teeth and gums. Regular physical activity. Eating a healthy diet. Avoiding tobacco and drug use. Limiting alcohol use. Practicing safe sex. Taking low doses of aspirin every day. Taking vitamin and mineral supplements as recommended by your health care provider. What happens  during an annual well check? The services and screenings done by your health care provider during your annual well check will depend on your age, overall health, lifestyle risk factors, and family history of disease. Counseling  Your health care provider may ask you questions about your: Alcohol use. Tobacco use. Drug use. Emotional well-being. Home and relationship well-being. Sexual activity. Eating habits. History of falls. Memory and ability to understand (cognition). Work and work Statistician. Screening  You may have the following tests or measurements: Height, weight, and BMI. Blood pressure. Lipid and cholesterol levels. These may be checked every 5 years, or more frequently if you are over 36 years old. Skin check. Lung cancer screening. You may have this screening every year starting at age 38 if you have a 30-pack-year history of smoking and currently smoke or have quit within the past 15 years. Fecal occult blood test (FOBT) of the stool. You may have this test every year starting at age 69. Flexible sigmoidoscopy or colonoscopy. You may have a sigmoidoscopy every 5 years or a colonoscopy every 10 years starting at age 45. Prostate cancer screening. Recommendations will vary depending on your family history and other risks. Hepatitis C blood test. Hepatitis B blood test. Sexually transmitted disease (STD) testing. Diabetes screening. This is done by checking your blood sugar (glucose) after you have not eaten for a while (fasting). You may have this done every 1-3 years. Abdominal aortic aneurysm (AAA) screening. You may need this if you are a current or former smoker. Osteoporosis. You may be screened starting at age 35 if you are at high risk. Talk with your health care provider about your test  results, treatment options, and if necessary, the need for more tests. Vaccines  Your health care provider may recommend certain vaccines, such as: Influenza vaccine. This is  recommended every year. Tetanus, diphtheria, and acellular pertussis (Tdap, Td) vaccine. You may need a Td booster every 10 years. Zoster vaccine. You may need this after age 31. Pneumococcal 13-valent conjugate (PCV13) vaccine. One dose is recommended after age 75. Pneumococcal polysaccharide (PPSV23) vaccine. One dose is recommended after age 53. Talk to your health care provider about which screenings and vaccines you need and how often you need them. This information is not intended to replace advice given to you by your health care provider. Make sure you discuss any questions you have with your health care provider. Document Released: 02/26/2015 Document Revised: 10/20/2015 Document Reviewed: 12/01/2014 Elsevier Interactive Patient Education  2017 [place] Prevention in the Home Falls can cause injuries. They can happen to people of all ages. There are many things you can do to make your home safe and to help prevent falls. What can I do on the outside of my home? Regularly fix the edges of walkways and driveways and fix any cracks. Remove anything that might make you trip as you walk through a door, such as a raised step or threshold. Trim any bushes or trees on the path to your home. Use bright outdoor lighting. Clear any walking paths of anything that might make someone trip, such as rocks or tools. Regularly check to see if handrails are loose or broken. Make sure that both sides of any steps have handrails. Any raised decks and porches should have guardrails on the edges. Have any leaves, snow, or ice cleared regularly. Use sand or salt on walking paths during winter. Clean up any spills in your garage right away. This includes oil or grease spills. What can I do in the bathroom? Use night lights. Install grab bars by the toilet and in the tub and shower. Do not use towel bars as grab bars. Use non-skid mats or decals in the tub or shower. If you need to sit down in  the shower, use a plastic, non-slip stool. Keep the floor dry. Clean up any water that spills on the floor as soon as it happens. Remove soap buildup in the tub or shower regularly. Attach bath mats securely with double-sided non-slip rug tape. Do not have throw rugs and other things on the floor that can make you trip. What can I do in the bedroom? Use night lights. Make sure that you have a light by your bed that is easy to reach. Do not use any sheets or blankets that are too big for your bed. They should not hang down onto the floor. Have a firm chair that has side arms. You can use this for support while you get dressed. Do not have throw rugs and other things on the floor that can make you trip. What can I do in the kitchen? Clean up any spills right away. Avoid walking on wet floors. Keep items that you use a lot in easy-to-reach places. If you need to reach something above you, use a strong step stool that has a grab bar. Keep electrical cords out of the way. Do not use floor polish or wax that makes floors slippery. If you must use wax, use non-skid floor wax. Do not have throw rugs and other things on the floor that can make you trip. What can I do with my stairs?  Do not leave any items on the stairs. Make sure that there are handrails on both sides of the stairs and use them. Fix handrails that are broken or loose. Make sure that handrails are as long as the stairways. Check any carpeting to make sure that it is firmly attached to the stairs. Fix any carpet that is loose or worn. Avoid having throw rugs at the top or bottom of the stairs. If you do have throw rugs, attach them to the floor with carpet tape. Make sure that you have a light switch at the top of the stairs and the bottom of the stairs. If you do not have them, ask someone to add them for you. What else can I do to help prevent falls? Wear shoes that: Do not have high heels. Have rubber bottoms. Are comfortable  and fit you well. Are closed at the toe. Do not wear sandals. If you use a stepladder: Make sure that it is fully opened. Do not climb a closed stepladder. Make sure that both sides of the stepladder are locked into place. Ask someone to hold it for you, if possible. Clearly mark and make sure that you can see: Any grab bars or handrails. First and last steps. Where the edge of each step is. Use tools that help you move around (mobility aids) if they are needed. These include: Canes. Walkers. Scooters. Crutches. Turn on the lights when you go into a dark area. Replace any light bulbs as soon as they burn out. Set up your furniture so you have a clear path. Avoid moving your furniture around. If any of your floors are uneven, fix them. If there are any pets around you, be aware of where they are. Review your medicines with your doctor. Some medicines can make you feel dizzy. This can increase your chance of falling. Ask your doctor what other things that you can do to help prevent falls. This information is not intended to replace advice given to you by your health care provider. Make sure you discuss any questions you have with your health care provider. Document Released: 11/26/2008 Document Revised: 07/08/2015 Document Reviewed: 03/06/2014 Elsevier Interactive Patient Education  2017 [employer].

## 2020-10-25 ENCOUNTER — Telehealth: Payer: Self-pay | Admitting: Family Medicine

## 2020-10-25 DIAGNOSIS — E785 Hyperlipidemia, unspecified: Secondary | ICD-10-CM

## 2020-10-25 DIAGNOSIS — F3340 Major depressive disorder, recurrent, in remission, unspecified: Secondary | ICD-10-CM

## 2020-10-25 DIAGNOSIS — F419 Anxiety disorder, unspecified: Secondary | ICD-10-CM

## 2020-10-25 MED ORDER — ESCITALOPRAM OXALATE 20 MG PO TABS
20.0000 mg | ORAL_TABLET | Freq: Every day | ORAL | 0 refills | Status: DC
Start: 2020-10-25 — End: 2020-11-01

## 2020-10-25 MED ORDER — ATORVASTATIN CALCIUM 20 MG PO TABS
20.0000 mg | ORAL_TABLET | Freq: Every day | ORAL | 0 refills | Status: DC
Start: 2020-10-25 — End: 2020-11-01

## 2020-10-25 NOTE — Telephone Encounter (Signed)
3o day supply given to patient. Patient has appointment on 11/01/2020.

## 2020-10-25 NOTE — Telephone Encounter (Signed)
  Prescription Request  10/25/2020  Is this a "Controlled Substance" medicine? no Have you seen your PCP in the last 2 weeks? no If YES, route message to pool  -  If NO, patient needs to be scheduled for appointment.  What is the name of the medication or equipment? Lipitor & Lexapro  Have you contacted your pharmacy to request a refill? yes  Which pharmacy would you like this sent to? [facility]   Patient notified that their request is being sent to the clinical staff for review and that they should receive a response within 2 business days.    Stacks' pt.  I told pt to keep appt next week. He wants one month supply until appt. I told him that they may only do Lipitor bc Lexapro maybe controlled.  Please call pt.

## 2020-11-01 ENCOUNTER — Ambulatory Visit (INDEPENDENT_AMBULATORY_CARE_PROVIDER_SITE_OTHER): Payer: PPO | Admitting: Family Medicine

## 2020-11-01 ENCOUNTER — Encounter: Payer: Self-pay | Admitting: Family Medicine

## 2020-11-01 ENCOUNTER — Other Ambulatory Visit: Payer: Self-pay

## 2020-11-01 VITALS — BP 128/73 | HR 64 | Temp 98.0°F | Ht 71.0 in | Wt 226.0 lb

## 2020-11-01 DIAGNOSIS — E559 Vitamin D deficiency, unspecified: Secondary | ICD-10-CM

## 2020-11-01 DIAGNOSIS — F419 Anxiety disorder, unspecified: Secondary | ICD-10-CM | POA: Diagnosis not present

## 2020-11-01 DIAGNOSIS — Z23 Encounter for immunization: Secondary | ICD-10-CM

## 2020-11-01 DIAGNOSIS — E785 Hyperlipidemia, unspecified: Secondary | ICD-10-CM | POA: Diagnosis not present

## 2020-11-01 DIAGNOSIS — Z125 Encounter for screening for malignant neoplasm of prostate: Secondary | ICD-10-CM | POA: Diagnosis not present

## 2020-11-01 DIAGNOSIS — F3340 Major depressive disorder, recurrent, in remission, unspecified: Secondary | ICD-10-CM | POA: Diagnosis not present

## 2020-11-01 MED ORDER — ATORVASTATIN CALCIUM 20 MG PO TABS: 20.0000 mg | ORAL_TABLET | Freq: Every day | ORAL | 3 refills | Status: DC

## 2020-11-01 MED ORDER — ESCITALOPRAM OXALATE 20 MG PO TABS: 20.0000 mg | ORAL_TABLET | Freq: Every day | ORAL | 3 refills | Status: DC

## 2020-11-01 NOTE — Progress Notes (Signed)
Subjective:  Patient ID: [name], male    DOB: 1945/12/07  Age: 75 y.o. MRN: [id number]  CC: Medical Management of Chronic Issues   HPI MAJOUR FREI presents for  in for follow-up of elevated cholesterol. Doing well without complaints on current medication. Denies side effects of statin including myalgia and arthralgia and nausea. Currently no chest pain, shortness of breath or other cardiovascular related symptoms noted.  He  also has vitamin D deficiency. He has been using OTC supplement.  Continues with Lexapro. In remission from active anxiety and depression sx.   Depression screen [facility] 2/9 11/01/2020 10/21/2020 12/22/2019  Decreased Interest 0 0 0  Down, Depressed, Hopeless 0 0 0  PHQ - 2 Score 0 0 0  Altered sleeping - - -  Tired, decreased energy - - -  Change in appetite - - -  Feeling bad or failure about yourself  - - -  Trouble concentrating - - -  Moving slowly or fidgety/restless - - -  Suicidal thoughts - - -  PHQ-9 Score - - -  Difficult doing work/chores - - -     History Kiyoto has a past medical history of Cancer ([place]), Carotid stenosis, Coronary artery disease, Degenerative joint disease, Depression with anxiety, Dyslipidemia, and Sinus bradycardia.   He has a past surgical history that includes orthopedic surgery; Coronary angioplasty with stent (2005?); Skin cancer excision; Hip arthroscopy; Hernia repair; Umbilical hernia repair; and Colonoscopy (x3-last time 10 yrs ago).   His family history includes COPD in his mother; Coronary artery disease in his father and another family member; Diabetes in his father.He reports that he quit smoking about 17 years ago. His smoking use included cigarettes. He has a 36.00 pack-year smoking history. He has never used smokeless tobacco. He reports that he does not drink alcohol and does not use drugs.    ROS Review of Systems  Constitutional:  Negative for fever.  Respiratory:  Negative for shortness of breath.    Cardiovascular:  Negative for chest pain.  Musculoskeletal:  Negative for arthralgias.  Skin:  Negative for rash.   Objective:  BP 128/73   Pulse 64   Temp 98 F (36.7 C)   Ht _0  (1.803 m)   Wt 226 lb (102.5 kg)   SpO2 94%   BMI 31.52 kg/m   BP Readings from Last 3 Encounters:  11/01/20 128/73  12/22/19 132/75  06/18/19 134/78    Wt Readings from Last 3 Encounters:  11/01/20 226 lb (102.5 kg)  10/21/20 225 lb (102.1 kg)  12/22/19 226 lb 12.8 oz (102.9 kg)     Physical Exam Constitutional:      General: He is not in acute distress.    Appearance: He is well-developed.  HENT:     Head: Normocephalic and atraumatic.     Right Ear: External ear normal.     Left Ear: External ear normal.     Nose: Nose normal.  Eyes:     Conjunctiva/sclera: Conjunctivae normal.     Pupils: Pupils are equal, round, and reactive to light.  Cardiovascular:     Rate and Rhythm: Normal rate and regular rhythm.     Heart sounds: Normal heart sounds. No murmur heard. Pulmonary:     Effort: Pulmonary effort is normal. No respiratory distress.     Breath sounds: Normal breath sounds. No wheezing or rales.  Abdominal:     Palpations: Abdomen is soft.     Tenderness: There is  no abdominal tenderness.  Musculoskeletal:        General: Normal range of motion.     Cervical back: Normal range of motion and neck supple.  Skin:    General: Skin is warm and dry.  Neurological:     Mental Status: He is alert and oriented to person, place, and time.     Deep Tendon Reflexes: Reflexes are normal and symmetric.  Psychiatric:        Behavior: Behavior normal.        Thought Content: Thought content normal.        Judgment: Judgment normal.      Assessment & Plan:   Yusuf was seen today for medical management of chronic issues.  Diagnoses and all orders for this visit:  Hyperlipidemia with target LDL less than 100 -     CBC with Differential/Platelet -     CMP14+EGFR -     Lipid  panel -     atorvastatin (LIPITOR) 20 MG tablet; Take 1 tablet (20 mg total) by mouth daily. (NEEDS TO BE SEEN BEFORE NEXT REFILL)  Vitamin D deficiency -     CBC with Differential/Platelet -     CMP14+EGFR -     VITAMIN D 25 Hydroxy (Vit-D Deficiency, Fractures)  Screening for prostate cancer -     PSA, total and free  Recurrent major depressive disorder, in remission ([place]) -     CBC with Differential/Platelet -     CMP14+EGFR -     escitalopram (LEXAPRO) 20 MG tablet; Take 1 tablet (20 mg total) by mouth daily.  Anxiety -     CBC with Differential/Platelet -     CMP14+EGFR -     escitalopram (LEXAPRO) 20 MG tablet; Take 1 tablet (20 mg total) by mouth daily.  Need for immunization against influenza -     Flu Vaccine QUAD High Dose(Fluad)  Need for shingles vaccine -     Varicella-zoster vaccine IM (Shingrix)      I am having Corwin H. Teale maintain his multivitamin with minerals, nitroGLYCERIN, Vitamin D, aspirin EC, acetaminophen, hydrOXYzine, Azelastine-Fluticasone, levocetirizine, atorvastatin, and escitalopram.  Allergies as of 11/01/2020   No Known Allergies      Medication List        Accurate as of November 01, 2020  3:09 PM. If you have any questions, ask your nurse or doctor.          acetaminophen 500 MG tablet Commonly known as: TYLENOL Take 1,000 mg by mouth every 6 (six) hours as needed.   aspirin EC 81 MG tablet Take 81 mg by mouth every morning.   atorvastatin 20 MG tablet Commonly known as: LIPITOR Take 1 tablet (20 mg total) by mouth daily. (NEEDS TO BE SEEN BEFORE NEXT REFILL)   Azelastine-Fluticasone 137-50 MCG/ACT Susp Place 2 sprays into the nose every 12 (twelve) hours.   escitalopram 20 MG tablet Commonly known as: LEXAPRO Take 1 tablet (20 mg total) by mouth daily.   hydrOXYzine 10 MG tablet Commonly known as: ATARAX/VISTARIL Take 1 tablet (10 mg total) by mouth 3 (three) times daily as needed for anxiety.    levocetirizine 5 MG tablet Commonly known as: XYZAL TAKE 1 TABLET BY MOUTH EVERY DAY IN THE EVENING   multivitamin with minerals tablet Take 1 tablet by mouth daily.   nitroGLYCERIN 0.4 MG SL tablet Commonly known as: NITROSTAT Place 0.4 mg under the tongue every 5 (five) minutes as needed. Reported on 04/01/2015  Vitamin D 50 MCG (2000 UT) Caps Take 1 capsule by mouth daily.         Follow-up: Return in about 6 months (around 05/01/2021).  Claretta Fraise, M.D.

## 2020-11-02 LAB — CBC WITH DIFFERENTIAL/PLATELET
Basophils Absolute: 0.1 x10E3/uL (ref 0.0–0.2)
Basos: 1 %
EOS (ABSOLUTE): 0.3 x10E3/uL (ref 0.0–0.4)
Eos: 4 %
Hematocrit: 40.6 % (ref 37.5–51.0)
Hemoglobin: 13.8 g/dL (ref 13.0–17.7)
Immature Grans (Abs): 0 x10E3/uL (ref 0.0–0.1)
Immature Granulocytes: 0 %
Lymphocytes Absolute: 2.8 x10E3/uL (ref 0.7–3.1)
Lymphs: 39 %
MCH: 32.6 pg (ref 26.6–33.0)
MCHC: 34 g/dL (ref 31.5–35.7)
MCV: 96 fL (ref 79–97)
Monocytes Absolute: 0.8 x10E3/uL (ref 0.1–0.9)
Monocytes: 11 %
Neutrophils Absolute: 3.3 x10E3/uL (ref 1.4–7.0)
Neutrophils: 45 %
Platelets: 261 x10E3/uL (ref 150–450)
RBC: 4.23 x10E6/uL (ref 4.14–5.80)
RDW: 12 % (ref 11.6–15.4)
WBC: 7.1 x10E3/uL (ref 3.4–10.8)

## 2020-11-02 LAB — VITAMIN D 25 HYDROXY (VIT D DEFICIENCY, FRACTURES): Vit D, 25-Hydroxy: 48.9 ng/mL (ref 30.0–100.0)

## 2020-11-02 LAB — LIPID PANEL
Chol/HDL Ratio: 3 ratio (ref 0.0–5.0)
Cholesterol, Total: 119 mg/dL (ref 100–199)
HDL: 40 mg/dL (ref >39)
LDL Chol Calc (NIH): 56 mg/dL (ref 0–99)
Triglycerides: 129 mg/dL (ref 0–149)
VLDL Cholesterol Cal: 23 mg/dL (ref 5–40)

## 2020-11-02 LAB — CMP14+EGFR
ALT: 32 IU/L (ref 0–44)
AST: 40 IU/L (ref 0–40)
Albumin/Globulin Ratio: 1.9 (ref 1.2–2.2)
Albumin: 4.4 g/dL (ref 3.7–4.7)
Alkaline Phosphatase: 79 IU/L (ref 44–121)
BUN/Creatinine Ratio: 11 (ref 10–24)
BUN: 9 mg/dL (ref 8–27)
Bilirubin Total: 0.3 mg/dL (ref 0.0–1.2)
CO2: 22 mmol/L (ref 20–29)
Calcium: 9.1 mg/dL (ref 8.6–10.2)
Chloride: 104 mmol/L (ref 96–106)
Creatinine, Ser: 0.83 mg/dL (ref 0.76–1.27)
Globulin, Total: 2.3 g/dL (ref 1.5–4.5)
Glucose: 101 mg/dL — ABNORMAL HIGH (ref 65–99)
Potassium: 4.8 mmol/L (ref 3.5–5.2)
Sodium: 140 mmol/L (ref 134–144)
Total Protein: 6.7 g/dL (ref 6.0–8.5)
eGFR: 91 mL/min/{1.73_m2} (ref >59)

## 2020-11-02 LAB — PSA, TOTAL AND FREE
PSA, Free Pct: 37.5 %
PSA, Free: 0.15 ng/mL
Prostate Specific Ag, Serum: 0.4 ng/mL (ref 0.0–4.0)

## 2020-11-02 NOTE — Progress Notes (Signed)
Hello Kiley,  Your lab result is normal and/or stable.Some minor variations that are not significant are commonly marked abnormal, but do not represent any medical problem for you.  Best regards, Evangelyn Crouse, M.D.

## 2021-02-25 ENCOUNTER — Ambulatory Visit (INDEPENDENT_AMBULATORY_CARE_PROVIDER_SITE_OTHER): Payer: PPO | Admitting: Family Medicine

## 2021-02-25 ENCOUNTER — Telehealth: Payer: Self-pay | Admitting: Family Medicine

## 2021-02-25 DIAGNOSIS — J329 Chronic sinusitis, unspecified: Secondary | ICD-10-CM | POA: Diagnosis not present

## 2021-02-25 DIAGNOSIS — J302 Other seasonal allergic rhinitis: Secondary | ICD-10-CM | POA: Diagnosis not present

## 2021-02-25 DIAGNOSIS — J310 Chronic rhinitis: Secondary | ICD-10-CM | POA: Diagnosis not present

## 2021-02-25 MED ORDER — AMOXICILLIN-POT CLAVULANATE 875-125 MG PO TABS: 1.0000 | ORAL_TABLET | Freq: Two times a day (BID) | ORAL | 0 refills | Status: DC

## 2021-02-25 MED ORDER — AZELASTINE-FLUTICASONE 137-50 MCG/ACT NA SUSP: 2.0000 | Freq: Two times a day (BID) | NASAL | 2 refills | Status: DC

## 2021-02-25 NOTE — Progress Notes (Signed)
Telephone visit  Subjective: CC: URI PCP: Claretta Fraise, MD [name] is a 76 y.o. male calls for telephone consult today. Patient provides verbal consent for consult held via phone.  Due to COVID-19 pandemic this visit was conducted virtually. This visit type was conducted due to national recommendations for restrictions regarding the COVID-19 Pandemic (e.g. social distancing, sheltering in place) in an effort to limit this patient's exposure and mitigate transmission in our community. All issues noted in this document were discussed and addressed.  A physical exam was not performed with this format.   Location of patient: home Location of provider: [facility] Others present for call: none  1. URI Patient reports onset of rhinorrhea about 3-4 weeks ago.  He notes that he was bedridden for about 1 week but that has gotten better.  Appetite is good.  Drainage is clear.  No fevers.  No cough reported.  Symptoms seem to be worse when he wakes up in the morning and he has an associated headache that gets better throughout the day.  The nasal and sinus stuffiness also seems to get better throughout the day until the evening time   ROS: Per HPI  No Known Allergies Past Medical History:  Diagnosis Date   Cancer ([place])    Skin   Carotid stenosis    Coronary artery disease    s/p Taxus stenting to the LAD.  This was 2005. Catheterization in 2008, demonstrated a proximal calcification with ostial 40-50% stenosis, it was a widely patent stent, the circumflex had 40%  proximal stenosis,  the right coronary artery is large and dominant with minor nonobstructive plaque, the EF was 55%.   Degenerative joint disease    Depression with anxiety    Dyslipidemia    Sinus bradycardia     Current Outpatient Medications:    acetaminophen (TYLENOL) 500 MG tablet, Take 1,000 mg by mouth every 6 (six) hours as needed., Disp: , Rfl:    aspirin EC 81 MG tablet, Take 81 mg by mouth every morning.  , Disp: ,  Rfl:    atorvastatin (LIPITOR) 20 MG tablet, Take 1 tablet (20 mg total) by mouth daily. (NEEDS TO BE SEEN BEFORE NEXT REFILL), Disp: 90 tablet, Rfl: 3   Azelastine-Fluticasone 137-50 MCG/ACT SUSP, Place 2 sprays into the nose every 12 (twelve) hours., Disp: 23 g, Rfl: 2   Cholecalciferol (VITAMIN D) 2000 UNITS CAPS, Take 1 capsule by mouth daily.  , Disp: , Rfl:    escitalopram (LEXAPRO) 20 MG tablet, Take 1 tablet (20 mg total) by mouth daily., Disp: 90 tablet, Rfl: 3   hydrOXYzine (ATARAX/VISTARIL) 10 MG tablet, Take 1 tablet (10 mg total) by mouth 3 (three) times daily as needed for anxiety., Disp: 30 tablet, Rfl: 0   levocetirizine (XYZAL) 5 MG tablet, TAKE 1 TABLET BY MOUTH EVERY DAY IN THE EVENING, Disp: 90 tablet, Rfl: 1   Multiple Vitamins-Minerals (MULTIVITAMIN WITH MINERALS) tablet, Take 1 tablet by mouth daily.  , Disp: , Rfl:    nitroGLYCERIN (NITROSTAT) 0.4 MG SL tablet, Place 0.4 mg under the tongue every 5 (five) minutes as needed. Reported on 04/01/2015, Disp: , Rfl:   Assessment/ Plan: 76 y.o. male   Rhinosinusitis - Plan: amoxicillin-clavulanate (AUGMENTIN) 875-125 MG tablet  Seasonal allergies - Plan: Azelastine-Fluticasone 137-50 MCG/ACT SUSP  I have given him a pocket prescription for Augmentin but I would like him to start the nasal spray first and see how that does for couple of days.  He  does not quite fit an infectious picture but he has had symptoms for more than 2 weeks, which is why have given him the empiric antibiotic should his symptoms not improve with the nasal spray.  He understands indications for use reasons for reevaluation and will follow up as needed  Start time: 8:54am End time: 9:00a  Total time spent on patient care (including telephone call/ virtual visit): 6 minutes  [place], [place] [phone]

## 2021-02-25 NOTE — Telephone Encounter (Signed)
°  Prescription Request  02/25/2021  Is this a "Controlled Substance" medicine? atorvastatin (LIPITOR) 20 MG tablet  Have you seen your PCP in the last 2 weeks? no  If YES, route message to pool  -  If NO, patient needs to be scheduled for appointment.  What is the name of the medication or equipment?  atorvastatin (LIPITOR) 20 MG tablet escitalopram (LEXAPRO) 20 MG tablet  Have you contacted your pharmacy to request a refill? No ntbs but has an apt scheduled for March  Which pharmacy would you like this sent to? [facility]   Patient notified that their request is being sent to the clinical staff for review and that they should receive a response within 2 business days.

## 2021-02-25 NOTE — Telephone Encounter (Signed)
Informed pt that when seen 11/01/20 refills were sent in for 3 mos w/ 3 refills

## 2021-03-25 ENCOUNTER — Other Ambulatory Visit: Payer: Self-pay | Admitting: Family Medicine

## 2021-03-25 DIAGNOSIS — J302 Other seasonal allergic rhinitis: Secondary | ICD-10-CM

## 2021-04-03 NOTE — Progress Notes (Signed)
HPI The patient presents with a one year followup of CAD.   Since I last saw him he has done okay.  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  He walks his dog. The patient denies any new symptoms such as chest discomfort, neck or arm discomfort. There has been no new shortness of breath, PND or orthopnea. There have been no reported palpitations, presyncope or syncope.    No Known Allergies  Current Outpatient Medications  Medication Sig Dispense Refill   acetaminophen (TYLENOL) 500 MG tablet Take 1,000 mg by mouth every 6 (six) hours as needed.     amoxicillin-clavulanate (AUGMENTIN) 875-125 MG tablet Take 1 tablet by mouth 2 (two) times daily. 20 tablet 0   aspirin EC 81 MG tablet Take 81 mg by mouth every morning.       atorvastatin (LIPITOR) 20 MG tablet Take 1 tablet (20 mg total) by mouth daily. (NEEDS TO BE SEEN BEFORE NEXT REFILL) 90 tablet 3   Azelastine-Fluticasone 137-50 MCG/ACT SUSP Place 2 sprays into the nose every 12 (twelve) hours. 23 g 2   Cholecalciferol (VITAMIN D) 2000 UNITS CAPS Take 1 capsule by mouth daily.       escitalopram (LEXAPRO) 20 MG tablet Take 1 tablet (20 mg total) by mouth daily. 90 tablet 3   levocetirizine (XYZAL) 5 MG tablet TAKE 1 TABLET BY MOUTH EVERY DAY IN THE EVENING 90 tablet 1   Multiple Vitamins-Minerals (MULTIVITAMIN WITH MINERALS) tablet Take 1 tablet by mouth daily.       nitroGLYCERIN (NITROSTAT) 0.4 MG SL tablet Place 0.4 mg under the tongue every 5 (five) minutes as needed. Reported on 04/01/2015     hydrOXYzine (ATARAX/VISTARIL) 10 MG tablet Take 1 tablet (10 mg total) by mouth 3 (three) times daily as needed for anxiety. (Patient not taking: Reported on 04/06/2021) 30 tablet 0   No current facility-administered medications for this visit.    Past Medical History:  Diagnosis Date   Cancer [facility])    Skin   Carotid stenosis    Coronary artery disease    s/p Taxus stenting to the LAD.  This was 2005. Catheterization in 2008, demonstrated a proximal  calcification with ostial 40-50% stenosis, it was a widely patent stent, the circumflex had 40%  proximal stenosis,  the right coronary artery is large and dominant with minor nonobstructive plaque, the EF was 55%.   Degenerative joint disease    Depression with anxiety    Dyslipidemia    Sinus bradycardia     Past Surgical History:  Procedure Laterality Date   COLONOSCOPY  x3-last time 10 yrs ago   [place] WITH STENT PLACEMENT  2005?   HERNIA REPAIR     HIP ARTHROSCOPY     ORTHOPEDIC SURGERY     right foot and right hip secondary to DJD   SKIN CANCER EXCISION     UMBILICAL HERNIA REPAIR      ROS: As stated in the HPI and negative for all other systems.   PHYSICAL EXAM BP 120/70    Pulse 67    Ht 5\' 11"  (1.803 m)    Wt 227 lb (103 kg)    BMI 31.66 kg/m   GENERAL:  Well appearing NECK:  No jugular venous distention, waveform within normal limits, carotid upstroke brisk and symmetric, no bruits, no thyromegaly LUNGS:  Clear to auscultation bilaterally CHEST:  Unremarkable HEART:  PMI not displaced or sustained,S1 and S2 within normal limits, no S3,  no S4, no clicks, no rubs, no murmurs ABD:  Flat, positive bowel sounds normal in frequency in pitch, no bruits, no rebound, no guarding, no midline pulsatile mass, no hepatomegaly, no splenomegaly EXT:  2 plus pulses throughout, no edema, no cyanosis no clubbing   Lab Results  Component Value Date   CHOL 119 11/01/2020   TRIG 129 11/01/2020   HDL 40 11/01/2020   LDLCALC 56 11/01/2020   LDLDIRECT 67.0 06/01/2006    EKG:  Sinus rhythm, rate 67, axis within normal limits, intervals within normal limits, no acute ST-T wave changes.  04/06/2021   ASSESSMENT AND PLAN   CAD:   The patient has no new sypmtoms.  No further cardiovascular testing is indicated.  We will continue with aggressive risk reduction and meds as listed.  CAROTID STENOSIS:   It has been many years since carotid Doppler and he had some  very mild stenosis then.  I will repeat carotid Dopplers.   DYSLIPIDEMIA:     LDL last year was 13 with an HDL of 40.  No change in therapy.

## 2021-04-06 ENCOUNTER — Other Ambulatory Visit: Payer: Self-pay

## 2021-04-06 ENCOUNTER — Encounter: Payer: Self-pay | Admitting: Cardiology

## 2021-04-06 ENCOUNTER — Ambulatory Visit (INDEPENDENT_AMBULATORY_CARE_PROVIDER_SITE_OTHER): Payer: PPO | Admitting: Cardiology

## 2021-04-06 VITALS — BP 120/70 | HR 67 | Ht 71.0 in | Wt 227.0 lb

## 2021-04-06 DIAGNOSIS — E785 Hyperlipidemia, unspecified: Secondary | ICD-10-CM | POA: Diagnosis not present

## 2021-04-06 DIAGNOSIS — I6529 Occlusion and stenosis of unspecified carotid artery: Secondary | ICD-10-CM | POA: Diagnosis not present

## 2021-04-06 DIAGNOSIS — I2510 Atherosclerotic heart disease of native coronary artery without angina pectoris: Secondary | ICD-10-CM | POA: Diagnosis not present

## 2021-04-06 NOTE — Patient Instructions (Signed)
Medication Instructions:  The current medical regimen is effective;  continue present plan and medications.  *If you need a refill on your cardiac medications before your next appointment, please call your pharmacy*  Testing/Procedures: Your physician has requested that you have a carotid duplex. This test is an ultrasound of the carotid arteries in your neck. It looks at blood flow through these arteries that supply the brain with blood. Allow one hour for this exam. There are no restrictions or special instructions.  You will be contacted to be scheduled for this testing.  Follow-Up: At [facility], you and your health needs are our priority.  As part of our continuing mission to provide you with exceptional heart care, we have created designated Provider Care Teams.  These Care Teams include your primary Cardiologist (physician) and Advanced Practice Providers (APPs -  Physician Assistants and Nurse Practitioners) who all work together to provide you with the care you need, when you need it.  We recommend signing up for the patient portal called "MyChart".  Sign up information is provided on this After Visit Summary.  MyChart is used to connect with patients for Virtual Visits (Telemedicine).  Patients are able to view lab/test results, encounter notes, upcoming appointments, etc.  Non-urgent messages can be sent to your provider as well.   To learn more about what you can do with MyChart, go to [email or web address].    Your next appointment:   2 year(s)  The format for your next appointment:   In Person  Provider:   Minus Breeding, MD{   Thank you for choosing [facility]!!

## 2021-04-13 ENCOUNTER — Telehealth: Payer: Self-pay | Admitting: Cardiology

## 2021-04-13 NOTE — Telephone Encounter (Signed)
Checking percert on the following patient for testing scheduled at [facility].   ? ?[place] CARTOID  ? ?04/18/2021 ?

## 2021-04-18 ENCOUNTER — Other Ambulatory Visit: Payer: Self-pay

## 2021-04-18 ENCOUNTER — Ambulatory Visit (HOSPITAL_COMMUNITY)
Admission: RE | Admit: 2021-04-18 | Discharge: 2021-04-18 | Disposition: A | Discharge status: Home / Self Care | Payer: PPO | Source: Ambulatory Visit | Attending: Cardiology | Admitting: Cardiology

## 2021-04-18 DIAGNOSIS — I6529 Occlusion and stenosis of unspecified carotid artery: Secondary | ICD-10-CM

## 2021-04-18 DIAGNOSIS — I6523 Occlusion and stenosis of bilateral carotid arteries: Secondary | ICD-10-CM | POA: Diagnosis not present

## 2021-04-25 ENCOUNTER — Telehealth: Payer: Self-pay | Admitting: *Deleted

## 2021-04-25 DIAGNOSIS — I6523 Occlusion and stenosis of bilateral carotid arteries: Secondary | ICD-10-CM

## 2021-04-25 NOTE — Telephone Encounter (Signed)
Minus Breeding, MD  ?04/23/2021 12:26 PM EST   ?  ?Minimal to moderate amount of bilateral atherosclerotic plaque, left ?greater than right, not resulting in a hemodynamically significant ?stenosis within either internal carotid artery.  Please follow up with repeat carotid Doppler in one year.  Call Mr. Bialas with the results and send results to Claretta Fraise, MD  ? ? ?Order placed for carotid doppler to be repeated in 1 yr at [facility].  Dr Quinn Axe has already been made aware.  ?

## 2021-04-28 ENCOUNTER — Encounter: Payer: Self-pay | Admitting: Family Medicine

## 2021-04-28 ENCOUNTER — Ambulatory Visit (INDEPENDENT_AMBULATORY_CARE_PROVIDER_SITE_OTHER): Payer: PPO | Admitting: Family Medicine

## 2021-04-28 VITALS — BP 126/69 | HR 60 | Temp 96.9°F | Ht 71.0 in | Wt 230.2 lb

## 2021-04-28 DIAGNOSIS — E785 Hyperlipidemia, unspecified: Secondary | ICD-10-CM | POA: Diagnosis not present

## 2021-04-28 DIAGNOSIS — I6529 Occlusion and stenosis of unspecified carotid artery: Secondary | ICD-10-CM

## 2021-04-28 DIAGNOSIS — Z23 Encounter for immunization: Secondary | ICD-10-CM

## 2021-04-28 DIAGNOSIS — I2510 Atherosclerotic heart disease of native coronary artery without angina pectoris: Secondary | ICD-10-CM | POA: Diagnosis not present

## 2021-04-28 DIAGNOSIS — E875 Hyperkalemia: Secondary | ICD-10-CM

## 2021-04-28 MED ORDER — ATORVASTATIN CALCIUM 40 MG PO TABS: 40.0000 mg | ORAL_TABLET | Freq: Every day | ORAL | 3 refills | Status: DC

## 2021-04-28 NOTE — Addendum Note (Signed)
Addended by: Baldomero Lamy B on: 04/28/2021 11:14 AM ? ? Modules accepted: Orders ? ?

## 2021-04-28 NOTE — Progress Notes (Signed)
? ?Subjective:  ?Patient ID: [name], male    DOB: August 21, 1945  Age: 76 y.o. MRN: [id number] ? ?CC: Medical Management of Chronic Issues ? ? ?HPI ?[name] presents for follow-up of elevated cholesterol. Doing well without complaints on current medication. Denies side effects of statin including myalgia and arthralgia and nausea. Also in today for liver function testing. Currently no chest pain, shortness of breath or other cardiovascular related symptoms noted. ? ? ?History ?[name] has a past medical history of Cancer [facility]), Carotid stenosis, Coronary artery disease, Degenerative joint disease, Depression with anxiety, Dyslipidemia, and Sinus bradycardia.  ? ?He has a past surgical history that includes orthopedic surgery; Coronary angioplasty with stent (2005?); Skin cancer excision; Hip arthroscopy; Hernia repair; Umbilical hernia repair; and Colonoscopy (x3-last time 10 yrs ago).  ? ?His family history includes COPD in his mother; Coronary artery disease in his father and another family member; Diabetes in his father.He reports that he quit smoking about 18 years ago. His smoking use included cigarettes. He has a 36.00 pack-year smoking history. He has never been exposed to tobacco smoke. He has never used smokeless tobacco. He reports that he does not drink alcohol and does not use drugs. ? ?Current Outpatient Medications on File Prior to Visit  ?Medication Sig Dispense Refill  ? acetaminophen (TYLENOL) 500 MG tablet Take 1,000 mg by mouth every 6 (six) hours as needed.    ? aspirin EC 81 MG tablet Take 81 mg by mouth every morning.      ? atorvastatin (LIPITOR) 20 MG tablet Take 1 tablet (20 mg total) by mouth daily. (NEEDS TO BE SEEN BEFORE NEXT REFILL) 90 tablet 3  ? Azelastine-Fluticasone 137-50 MCG/ACT SUSP Place 2 sprays into the nose every 12 (twelve) hours. 23 g 2  ? Cholecalciferol (VITAMIN D) 2000 UNITS CAPS Take 1 capsule by mouth daily.      ? escitalopram (LEXAPRO) 20 MG tablet Take 1  tablet (20 mg total) by mouth daily. 90 tablet 3  ? hydrOXYzine (ATARAX/VISTARIL) 10 MG tablet Take 1 tablet (10 mg total) by mouth 3 (three) times daily as needed for anxiety. 30 tablet 0  ? levocetirizine (XYZAL) 5 MG tablet TAKE 1 TABLET BY MOUTH EVERY DAY IN THE EVENING 90 tablet 1  ? Multiple Vitamins-Minerals (MULTIVITAMIN WITH MINERALS) tablet Take 1 tablet by mouth daily.      ? nitroGLYCERIN (NITROSTAT) 0.4 MG SL tablet Place 0.4 mg under the tongue every 5 (five) minutes as needed. Reported on 04/01/2015    ? ?No current facility-administered medications on file prior to visit.  ? ? ?ROS ?Review of Systems  ?Constitutional:  Negative for fever.  ?Respiratory:  Negative for shortness of breath.   ?Cardiovascular:  Negative for chest pain.  ?Musculoskeletal:  Negative for arthralgias.  ?Skin:  Negative for rash.  ? ?Objective:  ?BP 126/69   Pulse 60   Temp (!) 96.9 ?F (36.1 ?C)   Ht '5\' 11"'$  (1.803 m)   Wt 230 lb 3.2 oz (104.4 kg)   SpO2 96%   BMI 32.11 kg/m?  ? ?BP Readings from Last 3 Encounters:  ?04/28/21 126/69  ?04/06/21 120/70  ?11/01/20 128/73  ? ? ?Wt Readings from Last 3 Encounters:  ?04/28/21 230 lb 3.2 oz (104.4 kg)  ?04/06/21 227 lb (103 kg)  ?11/01/20 226 lb (102.5 kg)  ? ? ? ?Physical Exam ?Constitutional:   ?   General: He is not in acute distress. ?   Appearance: He is  well-developed.  ?HENT:  ?   Head: Normocephalic and atraumatic.  ?   Right Ear: External ear normal.  ?   Left Ear: External ear normal.  ?   Nose: Nose normal.  ?Eyes:  ?   Conjunctiva/sclera: Conjunctivae normal.  ?   Pupils: Pupils are equal, round, and reactive to light.  ?Cardiovascular:  ?   Rate and Rhythm: Normal rate and regular rhythm.  ?   Heart sounds: Normal heart sounds. No murmur heard. ?Pulmonary:  ?   Effort: Pulmonary effort is normal. No respiratory distress.  ?   Breath sounds: Normal breath sounds. No wheezing or rales.  ?Abdominal:  ?   Palpations: Abdomen is soft.  ?   Tenderness: There is no  abdominal tenderness.  ?Musculoskeletal:     ?   General: Normal range of motion.  ?   Cervical back: Normal range of motion and neck supple.  ?Skin: ?   General: Skin is warm and dry.  ?Neurological:  ?   Mental Status: He is alert and oriented to person, place, and time.  ?   Deep Tendon Reflexes: Reflexes are normal and symmetric.  ?Psychiatric:     ?   Behavior: Behavior normal.     ?   Thought Content: Thought content normal.     ?   Judgment: Judgment normal.  ? ? ?Lab Results  ?Component Value Date  ? HGBA1C 6.0 11/07/2016  ? ? ?Lab Results  ?Component Value Date  ? WBC 7.1 11/01/2020  ? HGB 13.8 11/01/2020  ? HCT 40.6 11/01/2020  ? PLT 261 11/01/2020  ? GLUCOSE 101 (H) 11/01/2020  ? CHOL 119 11/01/2020  ? TRIG 129 11/01/2020  ? HDL 40 11/01/2020  ? LDLDIRECT 67.0 06/01/2006  ? [name] 56 11/01/2020  ? ALT 32 11/01/2020  ? AST 40 11/01/2020  ? NA 140 11/01/2020  ? K 4.8 11/01/2020  ? CL 104 11/01/2020  ? CREATININE 0.83 11/01/2020  ? BUN 9 11/01/2020  ? CO2 22 11/01/2020  ? TSH 1.350 11/07/2016  ? PSA 0.3 01/13/2013  ? INR 1.0 10/15/2006  ? HGBA1C 6.0 11/07/2016  ? ? ?US Carotid Bilateral ? ?Result Date: 04/18/2021 ?CLINICAL DATA:  Follow-up carotid artery stenosis. History of CAD (post coronary stent placement) and hyperlipidemia. Former smoker. EXAM: BILATERAL CAROTID DUPLEX ULTRASOUND TECHNIQUE: Pearline Cables scale imaging, color Doppler and duplex ultrasound were performed of bilateral carotid and vertebral arteries in the neck. COMPARISON:  None. FINDINGS: Criteria: Quantification of carotid stenosis is based on velocity parameters that correlate the residual internal carotid diameter with NASCET-based stenosis levels, using the diameter of the distal internal carotid lumen as the denominator for stenosis measurement. The following velocity measurements were obtained: RIGHT ICA: 87/23 cm/sec CCA: 970/26 cm/sec SYSTOLIC ICA/CCA RATIO:  0.7 ECA: 126 cm/sec LEFT ICA: 103/21 cm/sec CCA: 378/58 cm/sec SYSTOLIC ICA/CCA  RATIO:  0.7 ECA: 149 cm/sec RIGHT CAROTID ARTERY: There is a minimal amount of eccentric partially shadowing plaque within the right carotid bulb (images 14 and 16), not resulting in elevated peak systolic velocities within the interrogated course of the right internal carotid artery to suggest a hemodynamically significant stenosis. RIGHT VERTEBRAL ARTERY:  Antegrade Flow LEFT CAROTID ARTERY: There is a moderate to large amount of eccentric echogenic plaque within mid aspect the left common carotid artery (image 41 40 and 41). There is a minimal to moderate amount of eccentric echogenic plaque within the left carotid bulb (image 50), extending to involve the origin and proximal  aspects of the left internal carotid artery (image 58), not resulting in elevated peak systolic velocities within the interrogated course of the left internal carotid artery to suggest a hemodynamically significant stenosis. LEFT VERTEBRAL ARTERY:  Antegrade Flow IMPRESSION: Minimal to moderate amount of bilateral atherosclerotic plaque, left greater than right, not resulting in a hemodynamically significant stenosis within either internal carotid artery. Electronically Signed   By: Sandi Mariscal M.D.   On: 04/18/2021 13:53  ? ? ?Assessment & Plan:  ? ?Yaseen was seen today for medical management of chronic issues. ? ?Diagnoses and all orders for this visit: ? ?Hyperlipidemia with target LDL less than 100 ? ?Serum potassium elevated ? ?Stenosis of carotid artery, unspecified laterality ? ?Atherosclerosis of native coronary artery of native heart without angina pectoris ? ? ?I have discontinued [name]. [name]'s amoxicillin-clavulanate. I am also having him maintain his multivitamin with minerals, nitroGLYCERIN, Vitamin D, aspirin EC, acetaminophen, hydrOXYzine, atorvastatin, escitalopram, Azelastine-Fluticasone, and levocetirizine. ? ?No orders of the defined types were placed in this encounter. ? ? ? ?Follow-up: No follow-ups on  file. ? ?Claretta Fraise, M.D. ?

## 2021-04-29 LAB — CBC WITH DIFFERENTIAL/PLATELET
Basophils Absolute: 0.1 10*3/uL (ref 0.0–0.2)
Basos: 1 %
EOS (ABSOLUTE): 0.5 10*3/uL — ABNORMAL HIGH (ref 0.0–0.4)
Eos: 6 %
Hematocrit: 41.4 % (ref 37.5–51.0)
Hemoglobin: 14.2 g/dL (ref 13.0–17.7)
Immature Grans (Abs): 0 10*3/uL (ref 0.0–0.1)
Immature Granulocytes: 0 %
Lymphocytes Absolute: 2.9 10*3/uL (ref 0.7–3.1)
Lymphs: 36 %
MCH: 32.9 pg (ref 26.6–33.0)
MCHC: 34.3 g/dL (ref 31.5–35.7)
MCV: 96 fL (ref 79–97)
Monocytes Absolute: 0.7 10*3/uL (ref 0.1–0.9)
Monocytes: 9 %
Neutrophils Absolute: 4 10*3/uL (ref 1.4–7.0)
Neutrophils: 48 %
Platelets: 276 10*3/uL (ref 150–450)
RBC: 4.31 x10E6/uL (ref 4.14–5.80)
RDW: 12.2 % (ref 11.6–15.4)
WBC: 8.3 10*3/uL (ref 3.4–10.8)

## 2021-04-29 LAB — CMP14+EGFR
ALT: 28 IU/L (ref 0–44)
AST: 30 IU/L (ref 0–40)
Albumin/Globulin Ratio: 2.1 (ref 1.2–2.2)
Albumin: 4.6 g/dL (ref 3.7–4.7)
Alkaline Phosphatase: 82 IU/L (ref 44–121)
BUN/Creatinine Ratio: 11 (ref 10–24)
BUN: 10 mg/dL (ref 8–27)
Bilirubin Total: 0.6 mg/dL (ref 0.0–1.2)
CO2: 25 mmol/L (ref 20–29)
Calcium: 9.6 mg/dL (ref 8.6–10.2)
Chloride: 105 mmol/L (ref 96–106)
Creatinine, Ser: 0.87 mg/dL (ref 0.76–1.27)
Globulin, Total: 2.2 g/dL (ref 1.5–4.5)
Glucose: 130 mg/dL — ABNORMAL HIGH (ref 70–99)
Potassium: 4.9 mmol/L (ref 3.5–5.2)
Sodium: 142 mmol/L (ref 134–144)
Total Protein: 6.8 g/dL (ref 6.0–8.5)
eGFR: 89 mL/min/{1.73_m2} (ref >59)

## 2021-04-29 LAB — LIPID PANEL
Chol/HDL Ratio: 2.9 ratio (ref 0.0–5.0)
Cholesterol, Total: 132 mg/dL (ref 100–199)
HDL: 46 mg/dL (ref >39)
LDL Chol Calc (NIH): 69 mg/dL (ref 0–99)
Triglycerides: 87 mg/dL (ref 0–149)
VLDL Cholesterol Cal: 17 mg/dL (ref 5–40)

## 2021-04-30 NOTE — Progress Notes (Signed)
Hello [name],  Your lab result is normal and/or stable.Some minor variations that are not significant are commonly marked abnormal, but do not represent any medical problem for you.  Best regards, Mandisa Persinger, M.D.

## 2021-08-08 DIAGNOSIS — H2513 Age-related nuclear cataract, bilateral: Secondary | ICD-10-CM | POA: Diagnosis not present

## 2021-08-08 DIAGNOSIS — H40033 Anatomical narrow angle, bilateral: Secondary | ICD-10-CM | POA: Diagnosis not present

## 2021-08-18 DIAGNOSIS — H25811 Combined forms of age-related cataract, right eye: Secondary | ICD-10-CM | POA: Diagnosis not present

## 2021-08-18 DIAGNOSIS — H25812 Combined forms of age-related cataract, left eye: Secondary | ICD-10-CM | POA: Diagnosis not present

## 2021-09-08 ENCOUNTER — Encounter: Payer: Self-pay | Admitting: Gastroenterology

## 2021-09-08 DIAGNOSIS — H25811 Combined forms of age-related cataract, right eye: Secondary | ICD-10-CM | POA: Diagnosis not present

## 2021-09-08 DIAGNOSIS — H269 Unspecified cataract: Secondary | ICD-10-CM | POA: Diagnosis not present

## 2021-09-15 DIAGNOSIS — Z9849 Cataract extraction status, unspecified eye: Secondary | ICD-10-CM | POA: Diagnosis not present

## 2021-09-15 DIAGNOSIS — E669 Obesity, unspecified: Secondary | ICD-10-CM | POA: Diagnosis not present

## 2021-09-15 DIAGNOSIS — J309 Allergic rhinitis, unspecified: Secondary | ICD-10-CM | POA: Diagnosis not present

## 2021-09-15 DIAGNOSIS — E785 Hyperlipidemia, unspecified: Secondary | ICD-10-CM | POA: Diagnosis not present

## 2021-09-15 DIAGNOSIS — I6529 Occlusion and stenosis of unspecified carotid artery: Secondary | ICD-10-CM | POA: Diagnosis not present

## 2021-09-15 DIAGNOSIS — H259 Unspecified age-related cataract: Secondary | ICD-10-CM | POA: Diagnosis not present

## 2021-09-15 DIAGNOSIS — I209 Angina pectoris, unspecified: Secondary | ICD-10-CM | POA: Diagnosis not present

## 2021-09-15 DIAGNOSIS — J449 Chronic obstructive pulmonary disease, unspecified: Secondary | ICD-10-CM | POA: Diagnosis not present

## 2021-09-15 DIAGNOSIS — Z87891 Personal history of nicotine dependence: Secondary | ICD-10-CM | POA: Diagnosis not present

## 2021-09-15 DIAGNOSIS — Z9861 Coronary angioplasty status: Secondary | ICD-10-CM | POA: Diagnosis not present

## 2021-09-15 DIAGNOSIS — Z7982 Long term (current) use of aspirin: Secondary | ICD-10-CM | POA: Diagnosis not present

## 2021-09-15 DIAGNOSIS — F3342 Major depressive disorder, recurrent, in full remission: Secondary | ICD-10-CM | POA: Diagnosis not present

## 2021-10-06 DIAGNOSIS — H25812 Combined forms of age-related cataract, left eye: Secondary | ICD-10-CM | POA: Diagnosis not present

## 2021-10-06 DIAGNOSIS — H269 Unspecified cataract: Secondary | ICD-10-CM | POA: Diagnosis not present

## 2021-10-18 DIAGNOSIS — H10013 Acute follicular conjunctivitis, bilateral: Secondary | ICD-10-CM | POA: Diagnosis not present

## 2021-10-24 ENCOUNTER — Ambulatory Visit (INDEPENDENT_AMBULATORY_CARE_PROVIDER_SITE_OTHER): Payer: PPO | Admitting: *Deleted

## 2021-10-24 DIAGNOSIS — Z1211 Encounter for screening for malignant neoplasm of colon: Secondary | ICD-10-CM

## 2021-10-24 DIAGNOSIS — Z0000 Encounter for general adult medical examination without abnormal findings: Secondary | ICD-10-CM | POA: Diagnosis not present

## 2021-10-24 NOTE — Patient Instructions (Signed)
  [facility] Maintenance Summary and Written Plan of Care  Mr. Gaus ,  Thank you for allowing me to perform your Medicare Annual Wellness Visit and for your ongoing commitment to your health.   Health Maintenance & Immunization History Health Maintenance  Topic Date Due   COVID-19 Vaccine (4 - Moderna series) 03/11/2020   COLONOSCOPY (Pts 45-38yr Insurance coverage will need to be confirmed)  08/25/2021   INFLUENZA VACCINE  09/13/2021   TETANUS/TDAP  04/29/2031   Pneumonia Vaccine 76 Years old  Completed   Hepatitis C Screening  Completed   Zoster Vaccines- Shingrix  Completed   HPV VACCINES  Aged Out   Immunization History  Administered Date(s) Administered   Fluad Quad(high Dose 65+) 11/26/2018, 11/01/2020   Influenza Inj Mdck Quad Pf 11/26/2018   Influenza, High Dose Seasonal PF 01/03/2016, 12/11/2016, 11/26/2017   Influenza,inj,Quad PF,6+ Mos 12/10/2012, 11/19/2013, 12/09/2014   Influenza-Unspecified 12/08/2019   Moderna Sars-Covid-2 Vaccination 03/25/2019, 04/22/2019, 01/15/2020   Pneumococcal Conjugate-13 10/27/2013   Pneumococcal Polysaccharide-23 06/09/2015   Tdap 01/27/2011, 04/28/2021   Zoster Recombinat (Shingrix) 04/22/2018, 11/01/2020    These are the patient goals that we discussed:  Goals Addressed             This Visit's Progress    AWV       10/24/2021 AWV Goal: Fall Prevention  Over the next year, patient will decrease their risk for falls by: Using assistive devices, such as a cane or walker, as needed Identifying fall risks within their home and correcting them by: Removing throw rugs Adding handrails to stairs or ramps Removing clutter and keeping a clear pathway throughout the home Increasing light, especially at night Adding shower handles/bars Raising toilet seat Identifying potential personal risk factors for falls: Medication side effects Incontinence/urgency Vestibular dysfunction Hearing  loss Musculoskeletal disorders Neurological disorders Orthostatic hypotension           This is a list of Health Maintenance Items that are overdue or due now: Health Maintenance Due  Topic Date Due   COVID-19 Vaccine (4 - Moderna series) 03/11/2020   COLONOSCOPY (Pts 45-460yrInsurance coverage will need to be confirmed)  08/25/2021   INFLUENZA VACCINE  09/13/2021     Orders/Referrals Placed Today: Orders Placed This Encounter  Procedures   Ambulatory referral to Gastroenterology    Referral Priority:   Routine    Referral Type:   Consultation    Referral Reason:   Specialty Services Required    Number of Visits Requested:   1   (Contact our referral department at [phone] you have not spoken with someone about your referral appointment within the next 5 days)    Follow-up Plan [facility] with [facility] as planned Schedule appointment for flu shot and COVID booster

## 2021-10-24 NOTE — Progress Notes (Signed)
MEDICARE ANNUAL WELLNESS VISIT  10/24/2021  Telephone Visit Disclaimer This Medicare AWV was conducted by telephone due to national recommendations for restrictions regarding the COVID-19 Pandemic (e.g. social distancing).  I verified, using two identifiers, that I am speaking with [name] or their authorized healthcare agent. I discussed the limitations, risks, security, and privacy concerns of performing an evaluation and management service by telephone and the potential availability of an in-person appointment in the future. The patient expressed understanding and agreed to proceed.  Location of Patient: Home Location of Provider (nurse):  [facility]  Subjective:    [name] is a 76 y.o. male patient of Stacks, Cletus Gash, MD who had a Medicare Annual Wellness Visit today via telephone. [name] is Retired and lives with their daughter. he has 3 children. he reports that he is socially active and does interact with friends/family regularly. he is minimally physically active and enjoys working on [employer] and being outside.  Patient Care Team: Claretta Fraise, MD as PCP - General (Family Medicine)     10/24/2021    2:55 PM 10/21/2020    2:57 PM 04/23/2019    8:45 AM 04/27/2014    9:02 AM  Advanced Directives  Does Patient Have a Medical Advance Directive? No No No No  Would patient like information on creating a medical advance directive? No - Patient declined No - Patient declined No - Patient declined No - patient declined information    Hospital Utilization Over the Past 12 Months: # of hospitalizations or ER visits: 0 # of surgeries: 2  Review of Systems    Patient reports that his overall health is unchanged compared to last year.    Patient Reported Readings (BP, Pulse, CBG, Weight, etc) none  Pain Assessment Pain : No/denies pain     Current Medications & Allergies (verified) Allergies as of 10/24/2021   No Known Allergies       Medication List        Accurate as of October 24, 2021  3:09 PM. If you have any questions, ask your nurse or doctor.          acetaminophen 500 MG tablet Commonly known as: TYLENOL Take 1,000 mg by mouth every 6 (six) hours as needed.   aspirin EC 81 MG tablet Take 81 mg by mouth every morning.   atorvastatin 40 MG tablet Commonly known as: LIPITOR Take 1 tablet (40 mg total) by mouth daily. (NEEDS TO BE SEEN BEFORE NEXT REFILL)   Azelastine-Fluticasone 137-50 MCG/ACT Susp Place 2 sprays into the nose every 12 (twelve) hours.   escitalopram 20 MG tablet Commonly known as: LEXAPRO Take 1 tablet (20 mg total) by mouth daily.   hydrOXYzine 10 MG tablet Commonly known as: ATARAX Take 1 tablet (10 mg total) by mouth 3 (three) times daily as needed for anxiety.   levocetirizine 5 MG tablet Commonly known as: XYZAL TAKE 1 TABLET BY MOUTH EVERY DAY IN THE EVENING   multivitamin with minerals tablet Take 1 tablet by mouth daily.   nitroGLYCERIN 0.4 MG SL tablet Commonly known as: NITROSTAT Place 0.4 mg under the tongue every 5 (five) minutes as needed. Reported on 04/01/2015   Vitamin D 50 MCG (2000 UT) Caps Take 1 capsule by mouth daily.        History (reviewed): Past Medical History:  Diagnosis Date   Cancer [facility])    Skin   Carotid stenosis    Coronary artery disease  s/p Taxus stenting to the LAD.  This was 2005. Catheterization in 2008, demonstrated a proximal calcification with ostial 40-50% stenosis, it was a widely patent stent, the circumflex had 40%  proximal stenosis,  the right coronary artery is large and dominant with minor nonobstructive plaque, the EF was 55%.   Degenerative joint disease    Depression with anxiety    Dyslipidemia    Sinus bradycardia    Past Surgical History:  Procedure Laterality Date   COLONOSCOPY  x3-last time 10 yrs ago   [place] WITH STENT PLACEMENT  2005?   HERNIA REPAIR     HIP  ARTHROSCOPY     ORTHOPEDIC SURGERY     right foot and right hip secondary to DJD   SKIN CANCER EXCISION     UMBILICAL HERNIA REPAIR     Family History  Problem Relation Age of Onset   COPD Mother    Coronary artery disease Father        stent   Diabetes Father    Coronary artery disease Other        s/p MI   Colon cancer Neg Hx    Esophageal cancer Neg Hx    Rectal cancer Neg Hx    Stomach cancer Neg Hx    Colon polyps Neg Hx    Social History   Socioeconomic History   Marital status: Married    Spouse name: Not on file   Number of children: 3   Years of education: 7th Grade   Highest education level: 7th grade  Occupational History   Occupation: Retired    Comment: Clinical biochemist  Tobacco Use   Smoking status: Former    Packs/day: 1.00    Years: 36.00    Total pack years: 36.00    Types: Cigarettes    Quit date: 02/14/2003    Years since quitting: 18.7    Passive exposure: Never   Smokeless tobacco: Never  Vaping Use   Vaping Use: Never used  Substance and Sexual Activity   Alcohol use: No    Alcohol/week: 0.0 standard drinks of alcohol    Comment: quit completely in 2005   Drug use: No   Sexual activity: Not Currently  Other Topics Concern   Not on file  Social History Narrative   Lives at home with Daughter Judeen Hammans) and Yolanda Bonine ([name])-he is still legally married but has lived separate from wife for over 25 years   Social Determinants of Health   Financial Resource Strain: Low Risk  (10/21/2020)   Overall Financial Resource Strain (CARDIA)    Difficulty of Paying Living Expenses: Not hard at all  Food Insecurity: No Food Insecurity (10/21/2020)   Hunger Vital Sign    Worried About Running Out of Food in the Last Year: Never true    Ran Out of Food in the Last Year: Never true  Transportation Needs: No Transportation Needs (10/21/2020)   PRAPARE - Hydrologist (Medical): No    Lack of Transportation (Non-Medical): No  Physical  Activity: Sufficiently Active (10/21/2020)   Exercise Vital Sign    Days of Exercise per Week: 7 days    Minutes of Exercise per Session: 30 min  Stress: No Stress Concern Present (10/21/2020)   [name]    Feeling of Stress : Only a little  Social Connections: Moderately Isolated (10/21/2020)   Social Connection and Isolation Panel [NHANES]  Frequency of Communication with Friends and Family: More than three times a week    Frequency of Social Gatherings with Friends and Family: More than three times a week    Attends Religious Services: Never    Marine scientist or Organizations: No    Attends Archivist Meetings: Never    Marital Status: Married    Activities of Daily Living    10/24/2021    2:50 PM  In your present state of health, do you have any difficulty performing the following activities:  Hearing? 1  Comment has trouble hearing.  Vision? 0  Comment Recently had eye surgery and wears reading glasses  Difficulty concentrating or making decisions? 0  Walking or climbing stairs? 0  Dressing or bathing? 0  Doing errands, shopping? 0  Preparing Food and eating ? N  Using the Toilet? N  In the past six months, have you accidently leaked urine? N  Do you have problems with loss of bowel control? N  Managing your Medications? N  Managing your Finances? N  Housekeeping or managing your Housekeeping? N    Patient Education/ Literacy How often do you need to have someone help you when you read instructions, pamphlets, or other written materials from your doctor or pharmacy?: 1 - Never  Exercise Current Exercise Habits: Home exercise routine, Type of exercise: walking, Time (Minutes): 35, Frequency (Times/Week): 5, Weekly Exercise (Minutes/Week): 175, Intensity: Moderate  Diet Patient reports consuming 3 meals a day and 1 snack(s) a day Patient reports that his primary diet is: Regular Patient  reports that she does have regular access to food.   Depression Screen    10/24/2021    2:50 PM 04/28/2021    7:54 AM 11/01/2020   11:16 AM 10/21/2020    2:58 PM 12/22/2019    8:13 AM 06/18/2019    2:57 PM 04/23/2019    8:46 AM  PHQ 2/9 Scores  PHQ - 2 Score 0 0 0 0 0 0 0  PHQ- 9 Score       0     Fall Risk    10/24/2021    2:50 PM 04/28/2021    7:54 AM 11/01/2020   11:16 AM 10/21/2020    3:29 PM 12/22/2019    8:13 AM  [place] in the past year? 0 0 0 0 0  Number falls in past yr: 0   0 0  Injury with Fall? 0   0 0  Risk for fall due to : Other (Comment)   Impaired vision No Fall Risks  Risk for fall due to: Comment age      Follow up    Falls prevention discussed Falls evaluation completed     Objective:  [name] seemed alert and oriented and he participated appropriately during our telephone visit.  Blood Pressure Weight BMI  BP Readings from Last 3 Encounters:  04/28/21 126/69  04/06/21 120/70  11/01/20 128/73   Wt Readings from Last 3 Encounters:  04/28/21 230 lb 3.2 oz (104.4 kg)  04/06/21 227 lb (103 kg)  11/01/20 226 lb (102.5 kg)   BMI Readings from Last 1 Encounters:  04/28/21 32.11 kg/m    *Unable to obtain current vital signs, weight, and BMI due to telephone visit type  Hearing/Vision  [name] did not seem to have difficulty with hearing/understanding during the telephone conversation Reports that he has had a formal eye exam by an eye care professional within the past  year Reports that he has not had a formal hearing evaluation within the past year *Unable to fully assess hearing and vision during telephone visit type  Cognitive Function:    10/24/2021    2:52 PM 04/23/2019    8:51 AM  6CIT Screen  What Year? 0 points 0 points  What month? 0 points 0 points  What time? 0 points 0 points  Count back from 20 0 points 0 points  Months in reverse 0 points 0 points  Repeat phrase 0 points 2 points  Total Score 0 points 2 points    (Normal:0-7, Significant for Dysfunction: >8)  Normal Cognitive Function Screening: Yes   Immunization & Health Maintenance Record Immunization History  Administered Date(s) Administered   Fluad Quad(high Dose 65+) 11/26/2018, 11/01/2020   Influenza Inj Mdck Quad Pf 11/26/2018   Influenza, High Dose Seasonal PF 01/03/2016, 12/11/2016, 11/26/2017   Influenza,inj,Quad PF,6+ Mos 12/10/2012, 11/19/2013, 12/09/2014   Influenza-Unspecified 12/08/2019   Moderna Sars-Covid-2 Vaccination 03/25/2019, 04/22/2019, 01/15/2020   Pneumococcal Conjugate-13 10/27/2013   Pneumococcal Polysaccharide-23 06/09/2015   Tdap 01/27/2011, 04/28/2021   Zoster Recombinat (Shingrix) 04/22/2018, 11/01/2020    Health Maintenance  Topic Date Due   COVID-19 Vaccine (4 - Moderna series) 03/11/2020   COLONOSCOPY (Pts 45-3yr Insurance coverage will need to be confirmed)  08/25/2021   INFLUENZA VACCINE  09/13/2021   TETANUS/TDAP  04/29/2031   Pneumonia Vaccine 76 Years old  Completed   Hepatitis C Screening  Completed   Zoster Vaccines- Shingrix  Completed   HPV VACCINES  Aged Out       Assessment  This is a routine wellness examination for [employer]  Health Maintenance: Due or Overdue Health Maintenance Due  Topic Date Due   COVID-19 Vaccine (4 - Moderna series) 03/11/2020   COLONOSCOPY (Pts 45-437yrInsurance coverage will need to be confirmed)  08/25/2021   INFLUENZA VACCINE  09/13/2021    ThMerlene Morseoes not need a referral for Community Assistance: Care Management:   no Social Work:    no Prescription Assistance:  no Nutrition/Diabetes Education:  no   Plan:  Personalized Goals  Goals Addressed             This Visit's Progress    AWV       10/24/2021 AWV Goal: Fall Prevention  Over the next year, patient will decrease their risk for falls by: Using assistive devices, such as a cane or walker, as needed Identifying fall risks within their home and correcting them  by: Removing throw rugs Adding handrails to stairs or ramps Removing clutter and keeping a clear pathway throughout the home Increasing light, especially at night Adding shower handles/bars Raising toilet seat Identifying potential personal risk factors for falls: Medication side effects Incontinence/urgency Vestibular dysfunction Hearing loss Musculoskeletal disorders Neurological disorders Orthostatic hypotension         Personalized Health Maintenance & Screening Recommendations  Influenza vaccine Colorectal cancer screening COVID Vaccine booster  Lung Cancer Screening Recommended: no (Low Dose CT Chest recommended if Age 77-80ears, 30 pack-year currently smoking OR have quit w/in past 15 years) Hepatitis C Screening recommended: not applicable HIV Screening recommended: no  Advanced Directives: Written information was not prepared per patient's request.  Referrals & Orders Orders Placed This Encounter  Procedures   Ambulatory referral to Gastroenterology    Follow-up Plan Follow-up with StClaretta FraiseMD as planned Schedule appointment for flu shot and COVID booster AVS printed and mailed to patient.   I  have personally reviewed and noted the following in the patient's chart:   Medical and social history Use of alcohol, tobacco or illicit drugs  Current medications and supplements Functional ability and status Nutritional status Physical activity Advanced directives List of other physicians Hospitalizations, surgeries, and ER visits in previous 12 months Vitals Screenings to include cognitive, depression, and falls Referrals and appointments  In addition, I have reviewed and discussed with [name] certain preventive protocols, quality metrics, and best practice recommendations. A written personalized care plan for preventive services as well as general preventive health recommendations is available and can be mailed to the patient at his  request.      Gareth Morgan  10/24/2021

## 2021-11-04 ENCOUNTER — Encounter: Payer: Self-pay | Admitting: Gastroenterology

## 2021-11-22 ENCOUNTER — Ambulatory Visit (AMBULATORY_SURGERY_CENTER): Payer: Self-pay

## 2021-11-22 VITALS — Ht 71.0 in | Wt 227.0 lb

## 2021-11-22 DIAGNOSIS — Z86010 Personal history of colonic polyps: Secondary | ICD-10-CM

## 2021-11-22 MED ORDER — PEG 3350-KCL-NA BICARB-NACL 420 G PO SOLR: 4000.0000 mL | Freq: Once | ORAL | 0 refills | Status: AC

## 2021-11-22 NOTE — Progress Notes (Signed)
No egg or soy allergy known to patient  No issues known to pt with past sedation with any surgeries or procedures Patient denies ever being told they had issues or difficulty with intubation  No FH of Malignant Hyperthermia Pt is not on diet pills Pt is not on home 02  Pt is not on blood thinners  Pt denies issues with constipation  No A fib or A flutter Have any cardiac testing pending--NO Pt instructed to use Singlecare.[email or web address] or GoodRx for a price reduction on prep   

## 2021-11-23 ENCOUNTER — Other Ambulatory Visit: Payer: Self-pay | Admitting: Family Medicine

## 2021-11-23 DIAGNOSIS — F3340 Major depressive disorder, recurrent, in remission, unspecified: Secondary | ICD-10-CM

## 2021-11-23 DIAGNOSIS — F419 Anxiety disorder, unspecified: Secondary | ICD-10-CM

## 2021-11-24 ENCOUNTER — Ambulatory Visit (INDEPENDENT_AMBULATORY_CARE_PROVIDER_SITE_OTHER): Payer: PPO | Admitting: Family Medicine

## 2021-11-24 ENCOUNTER — Encounter: Payer: Self-pay | Admitting: Family Medicine

## 2021-11-24 VITALS — BP 132/70 | HR 60 | Temp 98.0°F | Ht 71.0 in | Wt 228.4 lb

## 2021-11-24 DIAGNOSIS — E785 Hyperlipidemia, unspecified: Secondary | ICD-10-CM | POA: Diagnosis not present

## 2021-11-24 DIAGNOSIS — E559 Vitamin D deficiency, unspecified: Secondary | ICD-10-CM | POA: Diagnosis not present

## 2021-11-24 DIAGNOSIS — Z23 Encounter for immunization: Secondary | ICD-10-CM | POA: Diagnosis not present

## 2021-11-24 NOTE — Progress Notes (Signed)
Subjective:  Patient ID: [name], male    DOB: [birth year]  Age: 76 y.o. MRN: [id number]  CC: Medical Management of Chronic Issues   HPI JHAN CONERY presents for  in for follow-up of elevated cholesterol. Doing well without complaints on current medication. Denies side effects of statin including myalgia and arthralgia and nausea. Currently no chest pain, shortness of breath or other cardiovascular related symptoms noted.  Had cataracts removed. Seeing well. Having colonoscopy in about 3 weeks.        11/24/2021    8:39 AM 10/24/2021    2:50 PM 04/28/2021    7:54 AM  Depression screen PHQ 2/9  Decreased Interest 0 0 0  Down, Depressed, Hopeless 0 0 0  PHQ - 2 Score 0 0 0   .[email or web address] History Neal has a past medical history of Cancer ([place]), Carotid stenosis, Coronary artery disease, Coronary artery disease (CAD) excluded, Degenerative joint disease, Depression with anxiety, Dyslipidemia, Hyperlipidemia, Pneumonia, and Sinus bradycardia.   He has a past surgical history that includes orthopedic surgery; Coronary angioplasty with stent (2005?); Skin cancer excision; Hip arthroscopy; Hernia repair; Umbilical hernia repair; Colonoscopy (x3-last time 10 yrs ago); and Cataract extraction, bilateral (Bilateral).   His family history includes COPD in his mother; Coronary artery disease in his father and another family member; Diabetes in his father.He reports that he quit smoking about 18 years ago. His smoking use included cigarettes. He has a 36.00 pack-year smoking history. He has never been exposed to tobacco smoke. He has never used smokeless tobacco. He reports that he does not drink alcohol and does not use drugs.    ROS Review of Systems  Constitutional:  Negative for fever.  HENT:  Positive for congestion (around nose).   Respiratory:  Negative for shortness of breath.   Cardiovascular:  Negative for chest pain.  Musculoskeletal:  Negative for arthralgias.  Skin:   Negative for rash.    Objective:  BP 132/70   Pulse 60   Temp 98 F (36.7 C)   Ht 5' 11"  (1.803 m)   Wt 228 lb 6.4 oz (103.6 kg)   SpO2 93%   BMI 31.86 kg/m   BP Readings from Last 3 Encounters:  11/24/21 132/70  04/28/21 126/69  04/06/21 120/70    Wt Readings from Last 3 Encounters:  11/24/21 228 lb 6.4 oz (103.6 kg)  11/22/21 227 lb (103 kg)  04/28/21 230 lb 3.2 oz (104.4 kg)     Physical Exam Vitals reviewed.  Constitutional:      Appearance: He is well-developed.  HENT:     Head: Normocephalic and atraumatic.     Right Ear: External ear normal.     Left Ear: External ear normal.     Mouth/Throat:     Pharynx: No oropharyngeal exudate or posterior oropharyngeal erythema.  Eyes:     Pupils: Pupils are equal, round, and reactive to light.  Cardiovascular:     Rate and Rhythm: Normal rate and regular rhythm.     Heart sounds: No murmur heard. Pulmonary:     Effort: No respiratory distress.     Breath sounds: Normal breath sounds.  Musculoskeletal:     Cervical back: Normal range of motion and neck supple.  Neurological:     Mental Status: He is alert and oriented to person, place, and time.       Assessment & Plan:   [place]ford was seen today for medical management of chronic issues.  Diagnoses and  all orders for this visit:  Hyperlipidemia with target LDL less than 100 -     CBC with Differential/Platelet -     CMP14+EGFR -     Lipid panel  Vitamin D deficiency -     CBC with Differential/Platelet -     CMP14+EGFR -     VITAMIN D 25 Hydroxy (Vit-D Deficiency, Fractures)  Need for immunization against influenza -     CBC with Differential/Platelet -     CMP14+EGFR -     Flu Vaccine QUAD High Dose(Fluad)       I have discontinued Marcello Moores H. [place]'s hydrOXYzine. I am also having him maintain his multivitamin with minerals, nitroGLYCERIN, Vitamin D, aspirin EC, acetaminophen, Azelastine-Fluticasone, levocetirizine, atorvastatin, and  escitalopram.  Allergies as of 11/24/2021   No Known Allergies      Medication List        Accurate as of November 24, 2021 11:59 PM. If you have any questions, ask your nurse or doctor.          STOP taking these medications    hydrOXYzine 10 MG tablet Commonly known as: ATARAX Stopped by: Claretta Fraise, MD       TAKE these medications    acetaminophen 500 MG tablet Commonly known as: TYLENOL Take 1,000 mg by mouth every 6 (six) hours as needed.   aspirin EC 81 MG tablet Take 81 mg by mouth every morning.   atorvastatin 40 MG tablet Commonly known as: LIPITOR Take 1 tablet (40 mg total) by mouth daily. (NEEDS TO BE SEEN BEFORE NEXT REFILL)   Azelastine-Fluticasone 137-50 MCG/ACT Susp Place 2 sprays into the nose every 12 (twelve) hours.   escitalopram 20 MG tablet Commonly known as: LEXAPRO TAKE 1 TABLET BY MOUTH EVERY DAY   levocetirizine 5 MG tablet Commonly known as: XYZAL TAKE 1 TABLET BY MOUTH EVERY DAY IN THE EVENING   multivitamin with minerals tablet Take 1 tablet by mouth daily.   nitroGLYCERIN 0.4 MG SL tablet Commonly known as: NITROSTAT Place 0.4 mg under the tongue every 5 (five) minutes as needed. Reported on 04/01/2015   Vitamin D 50 MCG (2000 UT) Caps Take 1 capsule by mouth daily.         Follow-up: Return in about 6 months (around 05/26/2022).  Claretta Fraise, M.D.

## 2021-11-25 LAB — LIPID PANEL
Chol/HDL Ratio: 2.9 ratio (ref 0.0–5.0)
Cholesterol, Total: 128 mg/dL (ref 100–199)
HDL: 44 mg/dL (ref >39)
LDL Chol Calc (NIH): 68 mg/dL (ref 0–99)
Triglycerides: 80 mg/dL (ref 0–149)
VLDL Cholesterol Cal: 16 mg/dL (ref 5–40)

## 2021-11-25 LAB — CMP14+EGFR
ALT: 22 IU/L (ref 0–44)
AST: 22 IU/L (ref 0–40)
Albumin/Globulin Ratio: 1.9 (ref 1.2–2.2)
Albumin: 4.2 g/dL (ref 3.8–4.8)
Alkaline Phosphatase: 76 IU/L (ref 44–121)
BUN/Creatinine Ratio: 16 (ref 10–24)
BUN: 13 mg/dL (ref 8–27)
Bilirubin Total: 0.5 mg/dL (ref 0.0–1.2)
CO2: 23 mmol/L (ref 20–29)
Calcium: 9 mg/dL (ref 8.6–10.2)
Chloride: 104 mmol/L (ref 96–106)
Creatinine, Ser: 0.83 mg/dL (ref 0.76–1.27)
Globulin, Total: 2.2 g/dL (ref 1.5–4.5)
Glucose: 118 mg/dL — ABNORMAL HIGH (ref 70–99)
Potassium: 4.3 mmol/L (ref 3.5–5.2)
Sodium: 139 mmol/L (ref 134–144)
Total Protein: 6.4 g/dL (ref 6.0–8.5)
eGFR: 91 mL/min/{1.73_m2} (ref >59)

## 2021-11-25 LAB — CBC WITH DIFFERENTIAL/PLATELET
Basophils Absolute: 0.1 10*3/uL (ref 0.0–0.2)
Basos: 1 %
EOS (ABSOLUTE): 0.3 10*3/uL (ref 0.0–0.4)
Eos: 4 %
Hematocrit: 39 % (ref 37.5–51.0)
Hemoglobin: 13.3 g/dL (ref 13.0–17.7)
Immature Grans (Abs): 0 10*3/uL (ref 0.0–0.1)
Immature Granulocytes: 0 %
Lymphocytes Absolute: 2.5 10*3/uL (ref 0.7–3.1)
Lymphs: 33 %
MCH: 33.2 pg — ABNORMAL HIGH (ref 26.6–33.0)
MCHC: 34.1 g/dL (ref 31.5–35.7)
MCV: 97 fL (ref 79–97)
Monocytes Absolute: 0.6 10*3/uL (ref 0.1–0.9)
Monocytes: 8 %
Neutrophils Absolute: 4 10*3/uL (ref 1.4–7.0)
Neutrophils: 54 %
Platelets: 243 10*3/uL (ref 150–450)
RBC: 4.01 x10E6/uL — ABNORMAL LOW (ref 4.14–5.80)
RDW: 12 % (ref 11.6–15.4)
WBC: 7.3 10*3/uL (ref 3.4–10.8)

## 2021-11-25 LAB — VITAMIN D 25 HYDROXY (VIT D DEFICIENCY, FRACTURES): Vit D, 25-Hydroxy: 41.6 ng/mL (ref 30.0–100.0)

## 2021-11-26 NOTE — Progress Notes (Signed)
Hello Milad,  Your lab result is normal and/or stable.Some minor variations that are not significant are commonly marked abnormal, but do not represent any medical problem for you.  Best regards, Dawnn Nam, M.D.

## 2021-12-08 ENCOUNTER — Encounter: Payer: Self-pay | Admitting: Gastroenterology

## 2021-12-16 ENCOUNTER — Ambulatory Visit (AMBULATORY_SURGERY_CENTER): Payer: PPO | Admitting: Gastroenterology

## 2021-12-16 ENCOUNTER — Encounter: Payer: Self-pay | Admitting: Gastroenterology

## 2021-12-16 VITALS — BP 118/67 | HR 50 | Temp 98.6°F | Resp 16 | Ht 71.0 in | Wt 227.0 lb

## 2021-12-16 DIAGNOSIS — D125 Benign neoplasm of sigmoid colon: Secondary | ICD-10-CM | POA: Diagnosis not present

## 2021-12-16 DIAGNOSIS — D123 Benign neoplasm of transverse colon: Secondary | ICD-10-CM | POA: Diagnosis not present

## 2021-12-16 DIAGNOSIS — Z09 Encounter for follow-up examination after completed treatment for conditions other than malignant neoplasm: Secondary | ICD-10-CM | POA: Diagnosis not present

## 2021-12-16 DIAGNOSIS — D122 Benign neoplasm of ascending colon: Secondary | ICD-10-CM

## 2021-12-16 DIAGNOSIS — I2510 Atherosclerotic heart disease of native coronary artery without angina pectoris: Secondary | ICD-10-CM | POA: Diagnosis not present

## 2021-12-16 DIAGNOSIS — F418 Other specified anxiety disorders: Secondary | ICD-10-CM | POA: Diagnosis not present

## 2021-12-16 DIAGNOSIS — Z86010 Personal history of colonic polyps: Secondary | ICD-10-CM

## 2021-12-16 MED ORDER — SODIUM CHLORIDE 0.9 % IV SOLN: 500.0000 mL | Freq: Once | INTRAVENOUS | Status: DC

## 2021-12-16 NOTE — Progress Notes (Signed)
Pt's states no medical or surgical changes since previsit or office visit. 

## 2021-12-16 NOTE — Patient Instructions (Signed)
YOU HAD AN ENDOSCOPIC PROCEDURE TODAY AT [facility]:   Refer to the procedure report that was given to you for any specific questions about what was found during the examination.  If the procedure report does not answer your questions, please call your gastroenterologist to clarify.  If you requested that your care partner not be given the details of your procedure findings, then the procedure report has been included in a sealed envelope for you to review at your convenience later.  **Handout given on polyps, hemorrhoids and diverticulosis**  YOU SHOULD EXPECT: Some feelings of bloating in the abdomen. Passage of more gas than usual.  Walking can help get rid of the air that was put into your GI tract during the procedure and reduce the bloating. If you had a lower endoscopy (such as a colonoscopy or flexible sigmoidoscopy) you may notice spotting of blood in your stool or on the toilet paper. If you underwent a bowel prep for your procedure, you may not have a normal bowel movement for a few days.  Please Note:  You might notice some irritation and congestion in your nose or some drainage.  This is from the oxygen used during your procedure.  There is no need for concern and it should clear up in a day or so.  SYMPTOMS TO REPORT IMMEDIATELY:  Following lower endoscopy (colonoscopy or flexible sigmoidoscopy):  Excessive amounts of blood in the stool  Significant tenderness or worsening of abdominal pains  Swelling of the abdomen that is new, acute  Fever of 100F or higher   For urgent or emergent issues, a gastroenterologist can be reached at any hour by calling [phone]. Do not use MyChart messaging for urgent concerns.    DIET:  We do recommend a small meal at first, but then you may proceed to your regular diet.  Drink plenty of fluids but you should avoid alcoholic beverages for 24 hours.  ACTIVITY:  You should plan to take it easy for the rest of today and you  should NOT DRIVE or use heavy machinery until tomorrow (because of the sedation medicines used during the test).    FOLLOW UP: Our staff will call the number listed on your records the next business day following your procedure.  We will call around 7:15- 8:00 am to check on you and address any questions or concerns that you may have regarding the information given to you following your procedure. If we do not reach you, we will leave a message.     If any biopsies were taken you will be contacted by phone or by letter within the next 1-3 weeks.  Please call us at [phone] if you have not heard about the biopsies in 3 weeks.    SIGNATURES/CONFIDENTIALITY: You and/or your care partner have signed paperwork which will be entered into your electronic medical record.  These signatures attest to the fact that that the information above on your After Visit Summary has been reviewed and is understood.  Full responsibility of the confidentiality of this discharge information lies with you and/or your care-partner.

## 2021-12-16 NOTE — Progress Notes (Signed)
Called to room to assist during endoscopic procedure.  Patient ID and intended procedure confirmed with present staff. Received instructions for my participation in the procedure from the performing physician.  

## 2021-12-16 NOTE — Progress Notes (Signed)
[facility] History and Physical   Primary Care Physician:  Claretta Fraise, MD   Reason for Procedure:  History of adenomatous colon polyps  Plan:    Surveillance colonoscopy with possible interventions as needed     HPI: [name] is a very pleasant 76 y.o. male here for surveillance colonoscopy. Denies any nausea, vomiting, abdominal pain, melena or bright red blood per rectum  The risks and benefits as well as alternatives of endoscopic procedure(s) have been discussed and reviewed. All questions answered. The patient agrees to proceed.    Past Medical History:  Diagnosis Date   Cancer [facility])    Skin   Carotid stenosis    Coronary artery disease    s/p Taxus stenting to the LAD.  This was 2005. Catheterization in 2008, demonstrated a proximal calcification with ostial 40-50% stenosis, it was a widely patent stent, the circumflex had 40%  proximal stenosis,  the right coronary artery is large and dominant with minor nonobstructive plaque, the EF was 55%.   Coronary artery disease (CAD) excluded    Degenerative joint disease    Depression with anxiety    Dyslipidemia    Hyperlipidemia    Pneumonia    Sinus bradycardia     Past Surgical History:  Procedure Laterality Date   CATARACT EXTRACTION, BILATERAL Bilateral    COLONOSCOPY  x3-last time 10 yrs ago   [facility]   CORONARY ANGIOPLASTY WITH STENT PLACEMENT  2005?   HERNIA REPAIR     HIP ARTHROSCOPY     ORTHOPEDIC SURGERY     right foot and right hip secondary to DJD   SKIN CANCER EXCISION     UMBILICAL HERNIA REPAIR      Prior to Admission medications   Medication Sig Start Date End Date Taking? Authorizing Provider  acetaminophen (TYLENOL) 500 MG tablet Take 1,000 mg by mouth every 6 (six) hours as needed.   Yes [provider]  aspirin EC 81 MG tablet Take 81 mg by mouth every morning.     Yes [provider]  atorvastatin (LIPITOR) 40 MG tablet Take 1 tablet (40 mg total) by  mouth daily. (NEEDS TO BE SEEN BEFORE NEXT REFILL) 04/28/21  Yes Stacks, Cletus Gash, MD  Cholecalciferol (VITAMIN D) 2000 UNITS CAPS Take 1 capsule by mouth daily.     Yes [provider]  escitalopram (LEXAPRO) 20 MG tablet TAKE 1 TABLET BY MOUTH EVERY DAY 11/23/21  Yes Stacks, Cletus Gash, MD  levocetirizine (XYZAL) 5 MG tablet TAKE 1 TABLET BY MOUTH EVERY DAY IN THE EVENING 03/25/21  Yes Stacks, Cletus Gash, MD  Multiple Vitamins-Minerals (MULTIVITAMIN WITH MINERALS) tablet Take 1 tablet by mouth daily.     Yes [provider]  Azelastine-Fluticasone 137-50 MCG/ACT SUSP Place 2 sprays into the nose every 12 (twelve) hours. Patient not taking: Reported on 11/22/2021 02/25/21   Janora Norlander, DO  nitroGLYCERIN (NITROSTAT) 0.4 MG SL tablet Place 0.4 mg under the tongue every 5 (five) minutes as needed. Reported on 04/01/2015 Patient not taking: Reported on 11/22/2021    [provider]    Current Outpatient Medications  Medication Sig Dispense Refill   acetaminophen (TYLENOL) 500 MG tablet Take 1,000 mg by mouth every 6 (six) hours as needed.     aspirin EC 81 MG tablet Take 81 mg by mouth every morning.       atorvastatin (LIPITOR) 40 MG tablet Take 1 tablet (40 mg total) by mouth daily. (NEEDS TO BE SEEN BEFORE NEXT REFILL)  90 tablet 3   Cholecalciferol (VITAMIN D) 2000 UNITS CAPS Take 1 capsule by mouth daily.       escitalopram (LEXAPRO) 20 MG tablet TAKE 1 TABLET BY MOUTH EVERY DAY 90 tablet 0   levocetirizine (XYZAL) 5 MG tablet TAKE 1 TABLET BY MOUTH EVERY DAY IN THE EVENING 90 tablet 1   Multiple Vitamins-Minerals (MULTIVITAMIN WITH MINERALS) tablet Take 1 tablet by mouth daily.       Azelastine-Fluticasone 137-50 MCG/ACT SUSP Place 2 sprays into the nose every 12 (twelve) hours. (Patient not taking: Reported on 11/22/2021) 23 g 2   nitroGLYCERIN (NITROSTAT) 0.4 MG SL tablet Place 0.4 mg under the tongue every 5 (five) minutes as needed. Reported on 04/01/2015 (Patient not  taking: Reported on 11/22/2021)     Current Facility-Administered Medications  Medication Dose Route Frequency Provider Last Rate Last Admin   0.9 %  sodium chloride infusion  500 mL Intravenous Once Mauri Pole, MD        Allergies as of 12/16/2021   (No Known Allergies)    Family History  Problem Relation Age of Onset   COPD Mother    Coronary artery disease Father        stent   Diabetes Father    Coronary artery disease Other        s/p MI   Colon cancer Neg Hx    Esophageal cancer Neg Hx    Rectal cancer Neg Hx    Stomach cancer Neg Hx    Colon polyps Neg Hx     Social History   Socioeconomic History   Marital status: Married    Spouse name: Not on file   Number of children: 3   Years of education: 7th Grade   Highest education level: 7th grade  Occupational History   Occupation: Retired    Comment: Clinical biochemist  Tobacco Use   Smoking status: Former    Packs/day: 1.00    Years: 36.00    Total pack years: 36.00    Types: Cigarettes    Quit date: 02/14/2003    Years since quitting: 18.8    Passive exposure: Never   Smokeless tobacco: Never  Vaping Use   Vaping Use: Never used  Substance and Sexual Activity   Alcohol use: No    Alcohol/week: 0.0 standard drinks of alcohol    Comment: quit completely in 2005   Drug use: No   Sexual activity: Not Currently  Other Topics Concern   Not on file  Social History Narrative   Lives at home with Daughter Judeen Hammans) and Yolanda Bonine (Chaz)-he is still legally married but has lived separate from wife for over 99 years   Social Determinants of Health   Financial Resource Strain: Low Risk  (10/21/2020)   Overall Financial Resource Strain (CARDIA)    Difficulty of Paying Living Expenses: Not hard at all  Food Insecurity: No Food Insecurity (10/21/2020)   Hunger Vital Sign    Worried About Running Out of Food in the Last Year: Never true    Ran Out of Food in the Last Year: Never true  Transportation Needs: No  Transportation Needs (10/21/2020)   PRAPARE - Hydrologist (Medical): No    Lack of Transportation (Non-Medical): No  Physical Activity: Sufficiently Active (10/21/2020)   Exercise Vital Sign    Days of Exercise per Week: 7 days    Minutes of Exercise per Session: 30 min  Stress: No Stress Concern Present (10/21/2020)  [place] Questionnaire    Feeling of Stress : Only a little  Social Connections: Moderately Isolated (10/21/2020)   Social Connection and Isolation Panel [NHANES]    Frequency of Communication with Friends and Family: More than three times a week    Frequency of Social Gatherings with Friends and Family: More than three times a week    Attends Religious Services: Never    Marine scientist or Organizations: No    Attends Archivist Meetings: Never    Marital Status: Married  Human resources officer Violence: Not At Risk (10/21/2020)   Humiliation, Afraid, Rape, and Kick questionnaire    Fear of Current or Ex-Partner: No    Emotionally Abused: No    Physically Abused: No    Sexually Abused: No    Review of Systems:  All other review of systems negative except as mentioned in the HPI.  Physical Exam: Vital signs in last 24 hours: Blood Pressure (Abnormal) 143/63   Pulse (Abnormal) 58   Temperature 98.6 F (37 C)   Height '5\' 11"'$  (1.803 m)   Weight 227 lb (103 kg)   Oxygen Saturation 94%   Body Mass Index 31.66 kg/m  General:   Alert, NAD Lungs:  Clear .   Heart:  Regular rate and rhythm Abdomen:  Soft, nontender and nondistended. Neuro/Psych:  Alert and cooperative. Normal mood and affect. A and O x 3  Reviewed labs, radiology imaging, old records and pertinent past GI work up  Patient is appropriate for planned procedure(s) and anesthesia in an ambulatory setting   K. Denzil Magnuson , MD [id number]

## 2021-12-16 NOTE — Progress Notes (Signed)
Report to PACU, RN, vss, BBS= Clear.  

## 2021-12-16 NOTE — Op Note (Signed)
[place] Patient Name: [name] Procedure Date: 12/16/2021 9:28 AM MRN: [id number] Endoscopist: Mauri Pole , MD, [id number] Age: 76 Referring MD:  Date of Birth: 1945-10-15 Gender: Male Account #: [id number] Procedure:                Colonoscopy Indications:              High risk colon cancer surveillance: Personal                            history of colonic polyps, High risk colon cancer                            surveillance: Personal history of multiple (3 or                            more) adenomas Medicines:                Monitored Anesthesia Care Procedure:                Pre-Anesthesia Assessment:                           - Prior to the procedure, a History and Physical                            was performed, and patient medications and                            allergies were reviewed. The patient's tolerance of                            previous anesthesia was also reviewed. The risks                            and benefits of the procedure and the sedation                            options and risks were discussed with the patient.                            All questions were answered, and informed consent                            was obtained. Prior Anticoagulants: The patient has                            taken no anticoagulant or antiplatelet agents. ASA                            Grade Assessment: II - A patient with mild systemic                            disease. After reviewing the risks and benefits,  the patient was deemed in satisfactory condition to                            undergo the procedure.                           After obtaining informed consent, the colonoscope                            was passed under direct vision. Throughout the                            procedure, the patient's blood pressure, pulse, and                            oxygen saturations were monitored continuously.  The                            PCF-HQ190L Colonoscope was introduced through the                            anus and advanced to the the cecum, identified by                            appendiceal orifice and ileocecal valve. The                            colonoscopy was performed without difficulty. The                            patient tolerated the procedure well. The quality                            of the bowel preparation was good. The ileocecal                            valve, appendiceal orifice, and rectum were                            photographed. Scope In: 9:39:25 AM Scope Out: 10:04:58 AM Total Procedure Duration: 0 hours 25 minutes 33 seconds  Findings:                 The perianal and digital rectal examinations were                            normal.                           Nine sessile polyps were found in the sigmoid colon                            X 1, transverse colon X 2 and ascending colon X 6.  The polyps were 4 to 8 mm in size. These polyps                            were removed with a cold snare. Resection and                            retrieval were complete.                           A few small-mouthed diverticula were found in the                            sigmoid colon and descending colon.                           Non-bleeding external and internal hemorrhoids were                            found during retroflexion. The hemorrhoids were                            medium-sized. Complications:            No immediate complications. Estimated Blood Loss:     Estimated blood loss was minimal. Impression:               - Nine 4 to 8 mm polyps in the sigmoid colon, in                            the transverse colon and in the ascending colon,                            removed with a cold snare. Resected and retrieved.                           - Diverticulosis in the sigmoid colon and in the                             descending colon.                           - Non-bleeding external and internal hemorrhoids. Recommendation:           - Patient has a contact number available for                            emergencies. The signs and symptoms of potential                            delayed complications were discussed with the                            patient. Return to normal activities tomorrow.  Written discharge instructions were provided to the                            patient.                           - Resume previous diet.                           - Continue present medications.                           - Await pathology results.                           - No repeat colonoscopy due to age. Mauri Pole, MD 12/16/2021 10:10:23 AM This report has been signed electronically.

## 2021-12-19 ENCOUNTER — Telehealth: Payer: Self-pay | Admitting: *Deleted

## 2021-12-19 NOTE — Telephone Encounter (Signed)
  Follow up Call-     12/16/2021    8:23 AM  Call back number  Post procedure Call Back phone  # [phone]  Permission to leave phone message Yes     Patient questions:  Do you have a fever, pain , or abdominal swelling? No. Pain Score  0 *  Have you tolerated food without any problems? Yes.    Have you been able to return to your normal activities? Yes.    Do you have any questions about your discharge instructions: Diet   No. Medications  No. Follow up visit  No.  Do you have questions or concerns about your Care? No.  Actions: * If pain score is 4 or above: No action needed, pain <4.

## 2022-01-09 ENCOUNTER — Encounter: Payer: Self-pay | Admitting: Gastroenterology

## 2022-01-30 ENCOUNTER — Other Ambulatory Visit: Payer: Self-pay | Admitting: Family Medicine

## 2022-01-30 DIAGNOSIS — F419 Anxiety disorder, unspecified: Secondary | ICD-10-CM

## 2022-01-30 DIAGNOSIS — F3340 Major depressive disorder, recurrent, in remission, unspecified: Secondary | ICD-10-CM

## 2022-01-30 DIAGNOSIS — J302 Other seasonal allergic rhinitis: Secondary | ICD-10-CM

## 2022-04-28 ENCOUNTER — Ambulatory Visit (HOSPITAL_COMMUNITY)
Admission: RE | Admit: 2022-04-28 | Discharge: 2022-04-28 | Disposition: A | Discharge status: Home / Self Care | Payer: PPO | Source: Ambulatory Visit | Attending: Cardiology | Admitting: Cardiology

## 2022-04-28 DIAGNOSIS — I6523 Occlusion and stenosis of bilateral carotid arteries: Secondary | ICD-10-CM | POA: Diagnosis not present

## 2022-04-29 ENCOUNTER — Other Ambulatory Visit: Payer: Self-pay | Admitting: Family Medicine

## 2022-04-29 DIAGNOSIS — E785 Hyperlipidemia, unspecified: Secondary | ICD-10-CM

## 2022-05-03 ENCOUNTER — Encounter: Payer: Self-pay | Admitting: *Deleted

## 2022-05-29 ENCOUNTER — Encounter: Payer: Self-pay | Admitting: Family Medicine

## 2022-05-29 ENCOUNTER — Ambulatory Visit (INDEPENDENT_AMBULATORY_CARE_PROVIDER_SITE_OTHER): Payer: PPO | Admitting: Family Medicine

## 2022-05-29 VITALS — BP 130/69 | HR 57 | Temp 97.4°F | Ht 71.0 in | Wt 225.4 lb

## 2022-05-29 DIAGNOSIS — R739 Hyperglycemia, unspecified: Secondary | ICD-10-CM | POA: Diagnosis not present

## 2022-05-29 DIAGNOSIS — E785 Hyperlipidemia, unspecified: Secondary | ICD-10-CM

## 2022-05-29 DIAGNOSIS — I6529 Occlusion and stenosis of unspecified carotid artery: Secondary | ICD-10-CM

## 2022-05-29 DIAGNOSIS — I2510 Atherosclerotic heart disease of native coronary artery without angina pectoris: Secondary | ICD-10-CM

## 2022-05-29 NOTE — Progress Notes (Signed)
Subjective:  Patient ID: [name], male    DOB: [birth year]  Age: 77 y.o. MRN: [id number]  CC: Medical Management of Chronic Issues   HPI [name] presents for follow-up of elevated cholesterol. Doing well without complaints on current medication. Denies side effects of statin including myalgia and arthralgia and nausea. Also in today for liver function testing. Currently no chest pain, shortness of breath or other cardiovascular related symptoms noted.Pt. does have CAD and aortic atherosclerosis     05/29/2022    8:11 AM 05/29/2022    8:04 AM 11/24/2021    8:39 AM 10/24/2021    2:50 PM 04/28/2021    7:54 AM  Depression screen PHQ 2/9  Decreased Interest 0 0 0 0 0  Down, Depressed, Hopeless 0 0 0 0 0  PHQ - 2 Score 0 0 0 0 0      History [name] has a past medical history of Cancer, Carotid stenosis, Coronary artery disease, Coronary artery disease (CAD) excluded, Degenerative joint disease, Depression with anxiety, Dyslipidemia, Hyperlipidemia, Pneumonia, and Sinus bradycardia.   [name] has a past surgical history that includes orthopedic surgery; Coronary angioplasty with stent (2005?); Skin cancer excision; Hip arthroscopy; Hernia repair; Umbilical hernia repair; Colonoscopy (x3-last time 10 yrs ago); and Cataract extraction, bilateral (Bilateral).   His family history includes COPD in his mother; Coronary artery disease in his father and another family member; Diabetes in his father.[name] reports that [name] quit smoking about 19 years ago. His smoking use included cigarettes. [name] has a 36.00 pack-year smoking history. [name] has never been exposed to tobacco smoke. [name] has never used smokeless tobacco. [name] reports that [name] does not drink alcohol and does not use drugs.  Current Outpatient Medications on File Prior to Visit  Medication Sig Dispense Refill   acetaminophen (TYLENOL) 500 MG tablet Take 1,000 mg by mouth every 6 (six) hours as needed.     aspirin EC 81 MG tablet Take 81 mg by  mouth every morning.       atorvastatin (LIPITOR) 40 MG tablet TAKE 1 TABLET BY MOUTH EVERY DAY (40 MG TOTAL)-NEEDS TO BE SEEN BEFORE NEXT REFILL 90 tablet 0   Cholecalciferol (VITAMIN D) 2000 UNITS CAPS Take 1 capsule by mouth daily.       escitalopram (LEXAPRO) 20 MG tablet TAKE 1 TABLET BY MOUTH EVERY DAY 90 tablet 1   levocetirizine (XYZAL) 5 MG tablet TAKE 1 TABLET BY MOUTH EVERY DAY IN THE EVENING 90 tablet 1   Multiple Vitamins-Minerals (MULTIVITAMIN WITH MINERALS) tablet Take 1 tablet by mouth daily.       Azelastine-Fluticasone 137-50 MCG/ACT SUSP Place 2 sprays into the nose every 12 (twelve) hours. (Patient not taking: Reported on 11/22/2021) 23 g 2   nitroGLYCERIN (NITROSTAT) 0.4 MG SL tablet Place 0.4 mg under the tongue every 5 (five) minutes as needed. Reported on 04/01/2015 (Patient not taking: Reported on 11/22/2021)     No current facility-administered medications on file prior to visit.    ROS Review of Systems  Constitutional:  Negative for fever.  Respiratory:  Negative for shortness of breath.   Cardiovascular:  Negative for chest pain.  Gastrointestinal:  Negative for abdominal pain.  Musculoskeletal:  Positive for arthralgias (right shoulder, a little on the left. Using a topical cream.Gets stiff in cold weather).  Skin:  Negative for rash.    Objective:  BP 130/69   Pulse (!) 57   Temp (!) 97.4 F (36.3 C)   Ht   (1.803 m)   Wt 225 lb 6.4 oz (102.2 kg)   SpO2 93%   BMI 31.44 kg/m   BP Readings from Last 3 Encounters:  05/29/22 130/69  12/16/21 118/67  11/24/21 132/70    Wt Readings from Last 3 Encounters:  05/29/22 225 lb 6.4 oz (102.2 kg)  12/16/21 227 lb (103 kg)  11/24/21 228 lb 6.4 oz (103.6 kg)     Physical Exam Vitals reviewed.  Constitutional:      Appearance: [name] is well-developed.  HENT:     Head: Normocephalic and atraumatic.     Right Ear: External ear normal.     Left Ear: External ear normal.     Mouth/Throat:      Pharynx: No oropharyngeal exudate or posterior oropharyngeal erythema.  Eyes:     Pupils: Pupils are equal, round, and reactive to light.  Cardiovascular:     Rate and Rhythm: Normal rate and regular rhythm.     Heart sounds: No murmur heard. Pulmonary:     Effort: No respiratory distress.     Breath sounds: Normal breath sounds.  Musculoskeletal:     Cervical back: Normal range of motion and neck supple.  Neurological:     Mental Status: [name] is alert and oriented to person, place, and time.     Lab Results  Component Value Date   HGBA1C 6.0 11/07/2016    Lab Results  Component Value Date   WBC 7.3 11/24/2021   HGB 13.3 11/24/2021   HCT 39.0 11/24/2021   PLT 243 11/24/2021   GLUCOSE 118 (H) 11/24/2021   CHOL 128 11/24/2021   TRIG 80 11/24/2021   HDL 44 11/24/2021   LDLDIRECT 67.0 06/01/2006   LDLCALC 68 11/24/2021   ALT 22 11/24/2021   AST 22 11/24/2021   NA 139 11/24/2021   K 4.3 11/24/2021   CL 104 11/24/2021   CREATININE 0.83 11/24/2021   BUN 13 11/24/2021   CO2 23 11/24/2021   TSH 1.350 11/07/2016   PSA 0.3 01/13/2013   INR 1.0 10/15/2006   HGBA1C 6.0 11/07/2016    US Carotid Bilateral  Result Date: 04/28/2022 CLINICAL DATA:  77 year old male with bilateral carotid stenosis EXAM: BILATERAL CAROTID DUPLEX ULTRASOUND TECHNIQUE: Wallace Cullens scale imaging, color Doppler and duplex ultrasound were performed of bilateral carotid and vertebral arteries in the neck. COMPARISON:  04/18/2021 FINDINGS: Criteria: Quantification of carotid stenosis is based on velocity parameters that correlate the residual internal carotid diameter with NASCET-based stenosis levels, using the diameter of the distal internal carotid lumen as the denominator for stenosis measurement. The following velocity measurements were obtained: RIGHT ICA:  Systolic 80 cm/sec, Diastolic 21 cm/sec CCA:  112 cm/sec SYSTOLIC ICA/CCA RATIO:  0.7 ECA:  115 cm/sec LEFT ICA:  Systolic 87 cm/sec, Diastolic 22 cm/sec CCA:   125 cm/sec SYSTOLIC ICA/CCA RATIO:  0.7 ECA:  108 cm/sec Right Brachial SBP: Not acquired Left Brachial SBP: Not acquired RIGHT CAROTID ARTERY: No significant calcifications of the right common carotid artery. Intermediate waveform maintained. Moderate heterogeneous and partially calcified plaque at the right carotid bifurcation. Shadowing present. Low resistance waveform of the right ICA. No significant tortuosity. RIGHT VERTEBRAL ARTERY: Antegrade flow with low resistance waveform. LEFT CAROTID ARTERY: No significant calcifications of the left common carotid artery. Intermediate waveform maintained. Moderate heterogeneous and partially calcified plaque at the left carotid bifurcation. Shadowing present. Low resistance waveform of the left ICA. No significant tortuosity. LEFT VERTEBRAL ARTERY:  Antegrade flow with low resistance waveform. IMPRESSION:  Color duplex indicates moderate heterogeneous and calcified plaque, with no hemodynamically significant stenosis by duplex criteria in the extracranial cerebrovascular circulation. Signed, Yvone Neu. Miachel Roux, RPVI [facility] Electronically Signed   By: Gilmer Mor D.O.   On: 04/28/2022 12:21    Assessment & Plan:   Dandre was seen today for medical management of chronic issues.  Diagnoses and all orders for this visit:  Hyperlipidemia with target LDL less than 100 -     CBC with Differential/Platelet -     CMP14+EGFR -     Lipid panel  Atherosclerosis of native coronary artery of native heart without angina pectoris -     CBC with Differential/Platelet -     CMP14+EGFR  Stenosis of carotid artery, unspecified laterality   I am having [name]. Guimond maintain his multivitamin with minerals, nitroGLYCERIN, Vitamin D, aspirin EC, acetaminophen, Azelastine-Fluticasone, levocetirizine, escitalopram, and atorvastatin.  No orders of the defined types were placed in this  encounter.    Follow-up: Return in about 6 months (around 11/28/2022).  Mechele Claude, M.D.

## 2022-05-30 LAB — CBC WITH DIFFERENTIAL/PLATELET
Basophils Absolute: 0 10*3/uL (ref 0.0–0.2)
Basos: 1 %
EOS (ABSOLUTE): 0.4 10*3/uL (ref 0.0–0.4)
Eos: 5 %
Hematocrit: 42.7 % (ref 37.5–51.0)
Hemoglobin: 14.7 g/dL (ref 13.0–17.7)
Immature Grans (Abs): 0 10*3/uL (ref 0.0–0.1)
Immature Granulocytes: 0 %
Lymphocytes Absolute: 2.7 10*3/uL (ref 0.7–3.1)
Lymphs: 33 %
MCH: 33.6 pg — ABNORMAL HIGH (ref 26.6–33.0)
MCHC: 34.4 g/dL (ref 31.5–35.7)
MCV: 98 fL — ABNORMAL HIGH (ref 79–97)
Monocytes Absolute: 0.6 10*3/uL (ref 0.1–0.9)
Monocytes: 8 %
Neutrophils Absolute: 4.3 10*3/uL (ref 1.4–7.0)
Neutrophils: 53 %
Platelets: 277 10*3/uL (ref 150–450)
RBC: 4.38 x10E6/uL (ref 4.14–5.80)
RDW: 11.6 % (ref 11.6–15.4)
WBC: 8 10*3/uL (ref 3.4–10.8)

## 2022-05-30 LAB — CMP14+EGFR
ALT: 25 [IU]/L (ref 0–44)
AST: 26 [IU]/L (ref 0–40)
Albumin/Globulin Ratio: 1.8 (ref 1.2–2.2)
Albumin: 4.4 g/dL (ref 3.8–4.8)
Alkaline Phosphatase: 89 [IU]/L (ref 44–121)
BUN/Creatinine Ratio: 16 (ref 10–24)
BUN: 14 mg/dL (ref 8–27)
Bilirubin Total: 0.5 mg/dL (ref 0.0–1.2)
CO2: 20 mmol/L (ref 20–29)
Calcium: 9.8 mg/dL (ref 8.6–10.2)
Chloride: 105 mmol/L (ref 96–106)
Creatinine, Ser: 0.89 mg/dL (ref 0.76–1.27)
Globulin, Total: 2.4 g/dL (ref 1.5–4.5)
Glucose: 127 mg/dL — ABNORMAL HIGH (ref 70–99)
Potassium: 5.2 mmol/L (ref 3.5–5.2)
Sodium: 143 mmol/L (ref 134–144)
Total Protein: 6.8 g/dL (ref 6.0–8.5)
eGFR: 88 mL/min/{1.73_m2}

## 2022-05-30 LAB — LIPID PANEL
Chol/HDL Ratio: 3.2 ratio (ref 0.0–5.0)
Cholesterol, Total: 144 mg/dL (ref 100–199)
HDL: 45 mg/dL
LDL Chol Calc (NIH): 81 mg/dL (ref 0–99)
Triglycerides: 98 mg/dL (ref 0–149)
VLDL Cholesterol Cal: 18 mg/dL (ref 5–40)

## 2022-06-01 LAB — SPECIMEN STATUS REPORT

## 2022-06-02 LAB — HGB A1C W/O EAG: Hgb A1c MFr Bld: 6.3 % — ABNORMAL HIGH (ref 4.8–5.6)

## 2022-06-09 ENCOUNTER — Telehealth (HOSPITAL_BASED_OUTPATIENT_CLINIC_OR_DEPARTMENT_OTHER): Payer: Self-pay | Admitting: Family Medicine

## 2022-06-09 NOTE — Telephone Encounter (Signed)
Contacted [name] to schedule their annual wellness visit. Appointment made for 06/12/2022.  Thank you,  [name],  AMB Clinical Support [facility] Direct Dial ??[phone]

## 2022-06-12 ENCOUNTER — Ambulatory Visit (INDEPENDENT_AMBULATORY_CARE_PROVIDER_SITE_OTHER): Payer: PPO

## 2022-06-12 VITALS — Ht 71.0 in | Wt 225.0 lb

## 2022-06-12 DIAGNOSIS — Z0000 Encounter for general adult medical examination without abnormal findings: Secondary | ICD-10-CM

## 2022-06-12 NOTE — Patient Instructions (Signed)
[name] , Thank you for taking time to come for your Medicare Wellness Visit. I appreciate your ongoing commitment to your health goals. Please review the following plan we discussed and let me know if I can assist you in the future.   These are the goals we discussed:  Goals      DIET - INCREASE WATER INTAKE     Try to drink 6-8 glasses of water daily     Remain active and independent        This is a list of the screening recommended for you and due dates:  Health Maintenance  Topic Date Due   COVID-19 Vaccine (4 - 2023-24 season) 06/14/2022*   Flu Shot  09/14/2022   Medicare Annual Wellness Visit  06/12/2023   DTaP/Tdap/Td vaccine (3 - Td or Tdap) 04/29/2031   Pneumonia Vaccine  Completed   Hepatitis C Screening: USPSTF Recommendation to screen - Ages 20-79 yo.  Completed   Zoster (Shingles) Vaccine  Completed   HPV Vaccine  Aged Out   Colon Cancer Screening  Discontinued  *Topic was postponed. The date shown is not the original due date.    Advanced directives: Advance directive discussed with you today. I have provided a copy for you to complete at home and have notarized. Once this is complete please bring a copy in to our office so we can scan it into your chart.   Conditions/risks identified: Aim for 30 minutes of exercise or brisk walking, 6-8 glasses of water, and 5 servings of fruits and vegetables each day.   Next appointment: Follow up in one year for your annual wellness visit.   Preventive Care 78 Years and Older, Male  Preventive care refers to lifestyle choices and visits with your health care provider that can promote health and wellness. What does preventive care include? A yearly physical exam. This is also called an annual well check. Dental exams once or twice a year. Routine eye exams. Ask your health care provider how often you should have your eyes checked. Personal lifestyle choices, including: Daily care of your teeth and gums. Regular physical  activity. Eating a healthy diet. Avoiding tobacco and drug use. Limiting alcohol use. Practicing safe sex. Taking low doses of aspirin every day. Taking vitamin and mineral supplements as recommended by your health care provider. What happens during an annual well check? The services and screenings done by your health care provider during your annual well check will depend on your age, overall health, lifestyle risk factors, and family history of disease. Counseling  Your health care provider may ask you questions about your: Alcohol use. Tobacco use. Drug use. Emotional well-being. Home and relationship well-being. Sexual activity. Eating habits. History of falls. Memory and ability to understand (cognition). Work and work Astronomer. Screening  You may have the following tests or measurements: Height, weight, and BMI. Blood pressure. Lipid and cholesterol levels. These may be checked every 5 years, or more frequently if you are over 45 years old. Skin check. Lung cancer screening. You may have this screening every year starting at age 27 if you have a 30-pack-year history of smoking and currently smoke or have quit within the past 15 years. Fecal occult blood test (FOBT) of the stool. You may have this test every year starting at age 39. Flexible sigmoidoscopy or colonoscopy. You may have a sigmoidoscopy every 5 years or a colonoscopy every 10 years starting at age 100. Prostate cancer screening. Recommendations will vary depending on  your family history and other risks. Hepatitis C blood test. Hepatitis B blood test. Sexually transmitted disease (STD) testing. Diabetes screening. This is done by checking your blood sugar (glucose) after you have not eaten for a while (fasting). You may have this done every 1-3 years. Abdominal aortic aneurysm (AAA) screening. You may need this if you are a current or former smoker. Osteoporosis. You may be screened starting at age 59 if you are  at high risk. Talk with your health care provider about your test results, treatment options, and if necessary, the need for more tests. Vaccines  Your health care provider may recommend certain vaccines, such as: Influenza vaccine. This is recommended every year. Tetanus, diphtheria, and acellular pertussis (Tdap, Td) vaccine. You may need a Td booster every 10 years. Zoster vaccine. You may need this after age 35. Pneumococcal 13-valent conjugate (PCV13) vaccine. One dose is recommended after age 54. Pneumococcal polysaccharide (PPSV23) vaccine. One dose is recommended after age 7. Talk to your health care provider about which screenings and vaccines you need and how often you need them. This information is not intended to replace advice given to you by your health care provider. Make sure you discuss any questions you have with your health care provider. Document Released: 02/26/2015 Document Revised: 10/20/2015 Document Reviewed: 12/01/2014 Elsevier Interactive Patient Education  2017 [employer].  Fall Prevention in the Home Falls can cause injuries. They can happen to people of all ages. There are many things you can do to make your home safe and to help prevent falls. What can I do on the outside of my home? Regularly fix the edges of walkways and driveways and fix any cracks. Remove anything that might make you trip as you walk through a door, such as a raised step or threshold. Trim any bushes or trees on the path to your home. Use bright outdoor lighting. Clear any walking paths of anything that might make someone trip, such as rocks or tools. Regularly check to see if handrails are loose or broken. Make sure that both sides of any steps have handrails. Any raised decks and porches should have guardrails on the edges. Have any leaves, snow, or ice cleared regularly. Use sand or salt on walking paths during winter. Clean up any spills in your garage right away. This includes oil  or grease spills. What can I do in the bathroom? Use night lights. Install grab bars by the toilet and in the tub and shower. Do not use towel bars as grab bars. Use non-skid mats or decals in the tub or shower. If you need to sit down in the shower, use a plastic, non-slip stool. Keep the floor dry. Clean up any water that spills on the floor as soon as it happens. Remove soap buildup in the tub or shower regularly. Attach bath mats securely with double-sided non-slip rug tape. Do not have throw rugs and other things on the floor that can make you trip. What can I do in the bedroom? Use night lights. Make sure that you have a light by your bed that is easy to reach. Do not use any sheets or blankets that are too big for your bed. They should not hang down onto the floor. Have a firm chair that has side arms. You can use this for support while you get dressed. Do not have throw rugs and other things on the floor that can make you trip. What can I do in the kitchen? Clean  up any spills right away. Avoid walking on wet floors. Keep items that you use a lot in easy-to-reach places. If you need to reach something above you, use a strong step stool that has a grab bar. Keep electrical cords out of the way. Do not use floor polish or wax that makes floors slippery. If you must use wax, use non-skid floor wax. Do not have throw rugs and other things on the floor that can make you trip. What can I do with my stairs? Do not leave any items on the stairs. Make sure that there are handrails on both sides of the stairs and use them. Fix handrails that are broken or loose. Make sure that handrails are as long as the stairways. Check any carpeting to make sure that it is firmly attached to the stairs. Fix any carpet that is loose or worn. Avoid having throw rugs at the top or bottom of the stairs. If you do have throw rugs, attach them to the floor with carpet tape. Make sure that you have a light  switch at the top of the stairs and the bottom of the stairs. If you do not have them, ask someone to add them for you. What else can I do to help prevent falls? Wear shoes that: Do not have high heels. Have rubber bottoms. Are comfortable and fit you well. Are closed at the toe. Do not wear sandals. If you use a stepladder: Make sure that it is fully opened. Do not climb a closed stepladder. Make sure that both sides of the stepladder are locked into place. Ask someone to hold it for you, if possible. Clearly mark and make sure that you can see: Any grab bars or handrails. First and last steps. Where the edge of each step is. Use tools that help you move around (mobility aids) if they are needed. These include: Canes. Walkers. Scooters. Crutches. Turn on the lights when you go into a dark area. Replace any light bulbs as soon as they burn out. Set up your furniture so you have a clear path. Avoid moving your furniture around. If any of your floors are uneven, fix them. If there are any pets around you, be aware of where they are. Review your medicines with your doctor. Some medicines can make you feel dizzy. This can increase your chance of falling. Ask your doctor what other things that you can do to help prevent falls. This information is not intended to replace advice given to you by your health care provider. Make sure you discuss any questions you have with your health care provider. Document Released: 11/26/2008 Document Revised: 07/08/2015 Document Reviewed: 03/06/2014 Elsevier Interactive Patient Education  2017 [employer].

## 2022-06-12 NOTE — Progress Notes (Signed)
Subjective:   [name] is a 77 y.o. male who presents for Medicare Annual/Subsequent preventive examination.  I connected with  [name] on 06/12/22 by a audio enabled telemedicine application and verified that I am speaking with the correct person using two identifiers.  Patient Location: Home  Provider Location: Home Office  I discussed the limitations of evaluation and management by telemedicine. The patient expressed understanding and agreed to proceed.  Review of Systems     Cardiac Risk Factors include: advanced age (>67men, >54 women);dyslipidemia;male gender     Objective:    Today's Vitals   06/12/22 1418  Weight: 225 lb (102.1 kg)  Height: 5\' 11"  (1.803 m)   Body mass index is 31.38 kg/m.     06/12/2022    2:23 PM 10/24/2021    2:55 PM 10/21/2020    2:57 PM 04/23/2019    8:45 AM 04/27/2014    9:02 AM  Advanced Directives  Does Patient Have a Medical Advance Directive? No No No No No  Would patient like information on creating a medical advance directive? Yes (MAU/Ambulatory/Procedural Areas - Information given) No - Patient declined No - Patient declined No - Patient declined No - patient declined information    Current Medications (verified) Outpatient Encounter Medications as of 06/12/2022  Medication Sig   acetaminophen (TYLENOL) 500 MG tablet Take 1,000 mg by mouth every 6 (six) hours as needed.   aspirin EC 81 MG tablet Take 81 mg by mouth every morning.     atorvastatin (LIPITOR) 40 MG tablet TAKE 1 TABLET BY MOUTH EVERY DAY (40 MG TOTAL)-NEEDS TO BE SEEN BEFORE NEXT REFILL   Azelastine-Fluticasone 137-50 MCG/ACT SUSP Place 2 sprays into the nose every 12 (twelve) hours.   Cholecalciferol (VITAMIN D) 2000 UNITS CAPS Take 1 capsule by mouth daily.     escitalopram (LEXAPRO) 20 MG tablet TAKE 1 TABLET BY MOUTH EVERY DAY   levocetirizine (XYZAL) 5 MG tablet TAKE 1 TABLET BY MOUTH EVERY DAY IN THE EVENING   Multiple Vitamins-Minerals (MULTIVITAMIN  WITH MINERALS) tablet Take 1 tablet by mouth daily.     nitroGLYCERIN (NITROSTAT) 0.4 MG SL tablet Place 0.4 mg under the tongue every 5 (five) minutes as needed. Reported on 04/01/2015   No facility-administered encounter medications on file as of 06/12/2022.    Allergies (verified) Patient has no known allergies.   History: Past Medical History:  Diagnosis Date   Cancer (HCC)    Skin   Carotid stenosis    Coronary artery disease    s/p Taxus stenting to the LAD.  This was 2005. Catheterization in 2008, demonstrated a proximal calcification with ostial 40-50% stenosis, it was a widely patent stent, the circumflex had 40%  proximal stenosis,  the right coronary artery is large and dominant with minor nonobstructive plaque, the EF was 55%.   Coronary artery disease (CAD) excluded    Degenerative joint disease    Depression with anxiety    Dyslipidemia    Hyperlipidemia    Pneumonia    Sinus bradycardia    Past Surgical History:  Procedure Laterality Date   CATARACT EXTRACTION, BILATERAL Bilateral    COLONOSCOPY  x3-last time 10 yrs ago   [facility]   CORONARY ANGIOPLASTY WITH STENT PLACEMENT  2005?   HERNIA REPAIR     HIP ARTHROSCOPY     ORTHOPEDIC SURGERY     right foot and right hip secondary to DJD   SKIN CANCER EXCISION  UMBILICAL HERNIA REPAIR     Family History  Problem Relation Age of Onset   COPD Mother    Coronary artery disease Father        stent   Diabetes Father    Dementia Father    Dementia Paternal Aunt    Dementia Paternal Grandmother    Coronary artery disease Other        s/p MI   Colon cancer Neg Hx    Esophageal cancer Neg Hx    Rectal cancer Neg Hx    Stomach cancer Neg Hx    Colon polyps Neg Hx    Social History   Socioeconomic History   Marital status: Married    Spouse name: Not on file   Number of children: 3   Years of education: 7th Grade   Highest education level: 7th grade  Occupational History   Occupation: Retired     Comment: Personnel officer  Tobacco Use   Smoking status: Former    Packs/day: 1.00    Years: 36.00    Additional pack years: 0.00    Total pack years: 36.00    Types: Cigarettes    Quit date: 02/14/2003    Years since quitting: 19.3    Passive exposure: Never   Smokeless tobacco: Never  Vaping Use   Vaping Use: Never used  Substance and Sexual Activity   Alcohol use: No    Alcohol/week: 0.0 standard drinks of alcohol    Comment: quit completely in 2005   Drug use: No   Sexual activity: Not Currently  Other Topics Concern   Not on file  Social History Narrative   Lives at home with Daughter Cordelia Pen) and Lucila Maine (Chaz)-he is still legally married but has lived separate from wife for over 20 years   Social Determinants of Health   Financial Resource Strain: Low Risk  (06/12/2022)   Overall Financial Resource Strain (CARDIA)    Difficulty of Paying Living Expenses: Not hard at all  Food Insecurity: No Food Insecurity (06/12/2022)   Hunger Vital Sign    Worried About Running Out of Food in the Last Year: Never true    Ran Out of Food in the Last Year: Never true  Transportation Needs: No Transportation Needs (06/12/2022)   PRAPARE - Administrator, Civil Service (Medical): No    Lack of Transportation (Non-Medical): No  Physical Activity: Sufficiently Active (06/12/2022)   Exercise Vital Sign    Days of Exercise per Week: 5 days    Minutes of Exercise per Session: 30 min  Stress: No Stress Concern Present (06/12/2022)   [employer] of Occupational Health - Occupational Stress Questionnaire    Feeling of Stress : Not at all  Social Connections: Socially Isolated (06/12/2022)   Social Connection and Isolation Panel [NHANES]    Frequency of Communication with Friends and Family: More than three times a week    Frequency of Social Gatherings with Friends and Family: More than three times a week    Attends Religious Services: Never    Database administrator or  Organizations: No    Attends Engineer, structural: Never    Marital Status: Separated    Tobacco Counseling Counseling given: Not Answered   Clinical Intake:  Pre-visit preparation completed: Yes  Pain : No/denies pain  Diabetes: No  How often do you need to have someone help you when you read instructions, pamphlets, or other written materials from your doctor or pharmacy?: 1 -  Never  Diabetic?No   Interpreter Needed?: No  Information entered by :: Kandis Fantasia LPN   Activities of Daily Living    06/12/2022    2:23 PM 10/24/2021    2:50 PM  In your present state of health, do you have any difficulty performing the following activities:  Hearing? 0 1  Comment  has trouble hearing.  Vision? 0 0  Comment  Recently had eye surgery and wears reading glasses  Difficulty concentrating or making decisions? 0 0  Walking or climbing stairs? 0 0  Dressing or bathing? 0 0  Doing errands, shopping? 0 0  Preparing Food and eating ? N N  Using the Toilet? N N  In the past six months, have you accidently leaked urine? N N  Do you have problems with loss of bowel control? N N  Managing your Medications? N N  Managing your Finances? N N  Housekeeping or managing your Housekeeping? N N    Patient Care Team: Mechele Claude, MD as PCP - General (Family Medicine) Conley Rolls, [facility] [place], [place] as Referring Physician (Optometry)  Indicate any recent Medical Services you may have received from other than [facility] providers in the past year (date may be approximate).     Assessment:   This is a routine wellness examination for [facility].  Hearing/Vision screen Hearing Screening - Comments:: Denies hearing difficulties   Vision Screening - Comments:: up to date with routine eye exams with Dr. Conley Rolls    Dietary issues and exercise activities discussed: Current Exercise Habits: Home exercise routine, Type of exercise: walking;stretching, Time (Minutes): 30, Frequency (Times/Week): 5, Weekly  Exercise (Minutes/Week): 150, Intensity: Mild   Goals Addressed             This Visit's Progress    COMPLETED: AWV       10/24/2021 AWV Goal: Fall Prevention  Over the next year, patient will decrease their risk for falls by: Using assistive devices, such as a cane or walker, as needed Identifying fall risks within their home and correcting them by: Removing throw rugs Adding handrails to stairs or ramps Removing clutter and keeping a clear pathway throughout the home Increasing light, especially at night Adding shower handles/bars Raising toilet seat Identifying potential personal risk factors for falls: Medication side effects Incontinence/urgency Vestibular dysfunction Hearing loss Musculoskeletal disorders Neurological disorders Orthostatic hypotension       COMPLETED: Exercise 3x per week (30 min per time)       Try to exercise for at least 30 minutes, 3 times weekly Walking Stationary Bike Hand Weights     Remain active and independent        Depression Screen    06/12/2022    2:22 PM 05/29/2022    8:11 AM 05/29/2022    8:04 AM 11/24/2021    8:39 AM 10/24/2021    2:50 PM 04/28/2021    7:54 AM 11/01/2020   11:16 AM  PHQ 2/9 Scores  PHQ - 2 Score 0 0 0 0 0 0 0    Fall Risk    06/12/2022    2:21 PM 05/29/2022    8:14 AM 05/29/2022    8:11 AM 05/29/2022    8:03 AM 11/24/2021    8:38 AM  Fall Risk   Falls in the past year? 0 0 0 0 0  Number falls in past yr: 0      Injury with Fall? 0      Risk for fall due to : No Fall Risks  Follow up Falls prevention discussed;Education provided;Falls evaluation completed        FALL RISK PREVENTION PERTAINING TO THE HOME:  Any stairs in or around the home? No  If so, are there any without handrails? No  Home free of loose throw rugs in walkways, pet beds, electrical cords, etc? Yes  Adequate lighting in your home to reduce risk of falls? Yes   ASSISTIVE DEVICES UTILIZED TO PREVENT FALLS:  Life alert? No   Use of a cane, walker or w/c? No  Grab bars in the bathroom? Yes  Shower chair or bench in shower? No  Elevated toilet seat or a handicapped toilet? Yes   TIMED UP AND GO:  Was the test performed? No . Telephonic visit   Cognitive Function:    04/22/2018    9:26 AM  MMSE - Mini Mental State Exam  Orientation to time 5  Orientation to Place 5  Registration 3  Attention/ Calculation 4  Recall 3  Language- name 2 objects 2  Language- repeat 1  Language- follow 3 step command 3  Language- read & follow direction 1  Write a sentence 1  Copy design 1  Total score 29        06/12/2022    2:24 PM 10/24/2021    2:52 PM 04/23/2019    8:51 AM  6CIT Screen  What Year? 0 points 0 points 0 points  What month? 0 points 0 points 0 points  What time? 0 points 0 points 0 points  Count back from 20 0 points 0 points 0 points  Months in reverse 0 points 0 points 0 points  Repeat phrase 0 points 0 points 2 points  Total Score 0 points 0 points 2 points    Immunizations Immunization History  Administered Date(s) Administered   Fluad Quad(high Dose 65+) 11/26/2018, 11/01/2020, 11/24/2021   Influenza Inj Mdck Quad Pf 11/26/2018   Influenza, High Dose Seasonal PF 01/03/2016, 12/11/2016, 11/26/2017   Influenza,inj,Quad PF,6+ Mos 12/10/2012, 11/19/2013, 12/09/2014   Influenza-Unspecified 12/08/2019   Moderna Sars-Covid-2 Vaccination 03/25/2019, 04/22/2019, 01/15/2020   Pneumococcal Conjugate-13 10/27/2013   Pneumococcal Polysaccharide-23 06/09/2015   Tdap 01/27/2011, 04/28/2021   Zoster Recombinat (Shingrix) 04/22/2018, 11/01/2020    TDAP status: Up to date  Flu Vaccine status: Up to date  Pneumococcal vaccine status: Up to date  Covid-19 vaccine status: Information provided on how to obtain vaccines.   Qualifies for Shingles Vaccine? Yes   Zostavax completed No   Shingrix Completed?: Yes  Screening Tests Health Maintenance  Topic Date Due   COVID-19 Vaccine (4 - 2023-24  season) 06/14/2022 (Originally 10/14/2021)   INFLUENZA VACCINE  09/14/2022   Medicare Annual Wellness (AWV)  06/12/2023   DTaP/Tdap/Td (3 - Td or Tdap) 04/29/2031   Pneumonia Vaccine 48+ Years old  Completed   Hepatitis C Screening  Completed   Zoster Vaccines- Shingrix  Completed   HPV VACCINES  Aged Out   COLONOSCOPY (Pts 45-32yrs Insurance coverage will need to be confirmed)  Discontinued    Health Maintenance  There are no preventive care reminders to display for this patient.  Colorectal cancer screening: No longer required. Last colonoscopy 12/16/21  Lung Cancer Screening: (Low Dose CT Chest recommended if Age 44-80 years, 30 pack-year currently smoking OR have quit w/in 15years.) does not qualify.   Lung Cancer Screening Referral: n/a  Additional Screening:  Hepatitis C Screening: does qualify; Completed 12/07/14  Vision Screening: Recommended annual ophthalmology exams for early detection of glaucoma and other  disorders of the eye. Is the patient up to date with their annual eye exam?  Yes  Who is the provider or what is the name of the office in which the patient attends annual eye exams? Dr. Conley Rolls If pt is not established with a provider, would they like to be referred to a provider to establish care? No .   Dental Screening: Recommended annual dental exams for proper oral hygiene  Community Resource Referral / Chronic Care Management: CRR required this visit?  No   CCM required this visit?  No      Plan:     I have personally reviewed and noted the following in the patient's chart:   Medical and social history Use of alcohol, tobacco or illicit drugs  Current medications and supplements including opioid prescriptions. Patient is not currently taking opioid prescriptions. Functional ability and status Nutritional status Physical activity Advanced directives List of other physicians Hospitalizations, surgeries, and ER visits in previous 12  months Vitals Screenings to include cognitive, depression, and falls Referrals and appointments  In addition, I have reviewed and discussed with patient certain preventive protocols, quality metrics, and best practice recommendations. A written personalized care plan for preventive services as well as general preventive health recommendations were provided to patient.     Durwin Nora, [place]   1/61/0960   Due to this being a virtual visit, the after visit summary with patients personalized plan was offered to patient via mail or my-chart.  per request, patient was mailed a copy of AVS.  Nurse Notes: No concerns

## 2022-07-26 ENCOUNTER — Other Ambulatory Visit: Payer: Self-pay | Admitting: Family Medicine

## 2022-07-26 DIAGNOSIS — E785 Hyperlipidemia, unspecified: Secondary | ICD-10-CM

## 2022-07-26 DIAGNOSIS — F3340 Major depressive disorder, recurrent, in remission, unspecified: Secondary | ICD-10-CM

## 2022-07-26 DIAGNOSIS — J302 Other seasonal allergic rhinitis: Secondary | ICD-10-CM

## 2022-07-26 DIAGNOSIS — F419 Anxiety disorder, unspecified: Secondary | ICD-10-CM

## 2022-09-04 ENCOUNTER — Ambulatory Visit: Payer: PPO | Admitting: Family Medicine

## 2022-09-04 ENCOUNTER — Encounter: Payer: Self-pay | Admitting: Family Medicine

## 2022-10-26 ENCOUNTER — Other Ambulatory Visit: Payer: Self-pay | Admitting: Family Medicine

## 2022-10-26 DIAGNOSIS — E785 Hyperlipidemia, unspecified: Secondary | ICD-10-CM

## 2022-10-26 DIAGNOSIS — F419 Anxiety disorder, unspecified: Secondary | ICD-10-CM

## 2022-10-26 DIAGNOSIS — F3340 Major depressive disorder, recurrent, in remission, unspecified: Secondary | ICD-10-CM

## 2022-11-29 ENCOUNTER — Ambulatory Visit (INDEPENDENT_AMBULATORY_CARE_PROVIDER_SITE_OTHER): Payer: PPO | Admitting: Family Medicine

## 2022-11-29 ENCOUNTER — Encounter: Payer: Self-pay | Admitting: Family Medicine

## 2022-11-29 VITALS — BP 130/70 | HR 67 | Temp 97.5°F | Ht 71.0 in | Wt 222.2 lb

## 2022-11-29 DIAGNOSIS — Z23 Encounter for immunization: Secondary | ICD-10-CM | POA: Diagnosis not present

## 2022-11-29 DIAGNOSIS — E785 Hyperlipidemia, unspecified: Secondary | ICD-10-CM

## 2022-11-29 DIAGNOSIS — F3340 Major depressive disorder, recurrent, in remission, unspecified: Secondary | ICD-10-CM | POA: Diagnosis not present

## 2022-11-29 DIAGNOSIS — R7303 Prediabetes: Secondary | ICD-10-CM

## 2022-11-29 DIAGNOSIS — J302 Other seasonal allergic rhinitis: Secondary | ICD-10-CM | POA: Diagnosis not present

## 2022-11-29 DIAGNOSIS — F419 Anxiety disorder, unspecified: Secondary | ICD-10-CM

## 2022-11-29 LAB — CMP14+EGFR
ALT: 20 [IU]/L (ref 0–44)
AST: 26 [IU]/L (ref 0–40)
Albumin: 4.6 g/dL (ref 3.8–4.8)
Alkaline Phosphatase: 92 [IU]/L (ref 44–121)
BUN/Creatinine Ratio: 16 (ref 10–24)
BUN: 17 mg/dL (ref 8–27)
Bilirubin Total: 0.5 mg/dL (ref 0.0–1.2)
CO2: 24 mmol/L (ref 20–29)
Calcium: 10 mg/dL (ref 8.6–10.2)
Chloride: 102 mmol/L (ref 96–106)
Creatinine, Ser: 1.06 mg/dL (ref 0.76–1.27)
Globulin, Total: 2.5 g/dL (ref 1.5–4.5)
Glucose: 125 mg/dL — ABNORMAL HIGH (ref 70–99)
Potassium: 5.1 mmol/L (ref 3.5–5.2)
Sodium: 139 mmol/L (ref 134–144)
Total Protein: 7.1 g/dL (ref 6.0–8.5)
eGFR: 72 mL/min/{1.73_m2} (ref >59)

## 2022-11-29 LAB — CBC WITH DIFFERENTIAL/PLATELET
Basophils Absolute: 0.1 10*3/uL (ref 0.0–0.2)
Basos: 1 %
EOS (ABSOLUTE): 0.4 10*3/uL (ref 0.0–0.4)
Eos: 4 %
Hematocrit: 46.7 % (ref 37.5–51.0)
Hemoglobin: 15.8 g/dL (ref 13.0–17.7)
Immature Grans (Abs): 0 10*3/uL (ref 0.0–0.1)
Immature Granulocytes: 0 %
Lymphocytes Absolute: 3.2 10*3/uL — ABNORMAL HIGH (ref 0.7–3.1)
Lymphs: 32 %
MCH: 33.5 pg — ABNORMAL HIGH (ref 26.6–33.0)
MCHC: 33.8 g/dL (ref 31.5–35.7)
MCV: 99 fL — ABNORMAL HIGH (ref 79–97)
Monocytes Absolute: 0.8 10*3/uL (ref 0.1–0.9)
Monocytes: 8 %
Neutrophils Absolute: 5.7 10*3/uL (ref 1.4–7.0)
Neutrophils: 55 %
Platelets: 300 10*3/uL (ref 150–450)
RBC: 4.71 x10E6/uL (ref 4.14–5.80)
RDW: 11.8 % (ref 11.6–15.4)
WBC: 10.2 10*3/uL (ref 3.4–10.8)

## 2022-11-29 LAB — LIPID PANEL
Chol/HDL Ratio: 3.5 {ratio} (ref 0.0–5.0)
Cholesterol, Total: 163 mg/dL (ref 100–199)
HDL: 46 mg/dL (ref >39)
LDL Chol Calc (NIH): 92 mg/dL (ref 0–99)
Triglycerides: 142 mg/dL (ref 0–149)
VLDL Cholesterol Cal: 25 mg/dL (ref 5–40)

## 2022-11-29 LAB — BAYER DCA HB A1C WAIVED: HB A1C (BAYER DCA - WAIVED): 5.8 % — ABNORMAL HIGH (ref 4.8–5.6)

## 2022-11-29 MED ORDER — ESCITALOPRAM OXALATE 20 MG PO TABS
20.0000 mg | ORAL_TABLET | Freq: Every day | ORAL | 3 refills | Status: DC
Start: 2022-11-29 — End: 2023-12-25

## 2022-11-29 MED ORDER — AZELASTINE-FLUTICASONE 137-50 MCG/ACT NA SUSP
2.0000 | Freq: Two times a day (BID) | NASAL | 2 refills | Status: DC
Start: 2022-11-29 — End: 2023-12-25

## 2022-11-29 MED ORDER — ATORVASTATIN CALCIUM 40 MG PO TABS
40.0000 mg | ORAL_TABLET | Freq: Every day | ORAL | 3 refills | Status: DC
Start: 2022-11-29 — End: 2023-05-23

## 2022-11-29 NOTE — Progress Notes (Signed)
Subjective:  Patient ID: [name], male    DOB: 1945-10-11  Age: 77 y.o. MRN: [id number]  CC: Medical Management of Chronic Issues   HPI [name] presents for  in for follow-up of elevated cholesterol. Doing well without complaints on current medication. Denies side effects of statin including myalgia and arthralgia and nausea. Currently no chest pain, shortness of breath or other cardiovascular related symptoms noted.  Diabetes runs in family. Borderline A1c. Due for recheck today.      11/29/2022    8:27 AM 06/12/2022    2:22 PM 05/29/2022    8:11 AM  Depression screen PHQ 2/9  Decreased Interest 0 0 0  Down, Depressed, Hopeless 0 0 0  PHQ - 2 Score 0 0 0    History [name] has a past medical history of Cancer (HCC), Carotid stenosis, Coronary artery disease, Coronary artery disease (CAD) excluded, Degenerative joint disease, Depression with anxiety, Dyslipidemia, Hyperlipidemia, Pneumonia, and Sinus bradycardia.   He has a past surgical history that includes orthopedic surgery; Coronary angioplasty with stent (2005?); Skin cancer excision; Hip arthroscopy; Hernia repair; Umbilical hernia repair; Colonoscopy (x3-last time 10 yrs ago); and Cataract extraction, bilateral (Bilateral).   His family history includes COPD in his mother; Coronary artery disease in his father and another family member; Dementia in his father, paternal aunt, and paternal grandmother; Diabetes in his father.He reports that he quit smoking about 19 years ago. His smoking use included cigarettes. He started smoking about 55 years ago. He has a 36 pack-year smoking history. He has never been exposed to tobacco smoke. He has never used smokeless tobacco. He reports that he does not drink alcohol and does not use drugs.    ROS Review of Systems  Constitutional:  Negative for fever.  Respiratory:  Negative for shortness of breath.   Cardiovascular:  Negative for chest pain.  Musculoskeletal:   Negative for arthralgias.  Skin:  Negative for rash.    Objective:  BP 130/70   Pulse 67   Temp (!) 97.5 F (36.4 C)   Ht 5\' 11"  (1.803 m)   Wt 222 lb 3.2 oz (100.8 kg)   SpO2 94%   BMI 30.99 kg/m   BP Readings from Last 3 Encounters:  11/29/22 130/70  05/29/22 130/69  12/16/21 118/67    Wt Readings from Last 3 Encounters:  11/29/22 222 lb 3.2 oz (100.8 kg)  06/12/22 225 lb (102.1 kg)  05/29/22 225 lb 6.4 oz (102.2 kg)     Physical Exam Vitals reviewed.  Constitutional:      Appearance: He is well-developed.  HENT:     Head: Normocephalic and atraumatic.     Right Ear: External ear normal.     Left Ear: External ear normal.     Mouth/Throat:     Pharynx: No oropharyngeal exudate or posterior oropharyngeal erythema.  Eyes:     Pupils: Pupils are equal, round, and reactive to light.  Cardiovascular:     Rate and Rhythm: Normal rate and regular rhythm.     Heart sounds: No murmur heard. Pulmonary:     Effort: No respiratory distress.     Breath sounds: Normal breath sounds.  Musculoskeletal:     Cervical back: Normal range of motion and neck supple.  Neurological:     Mental Status: He is alert and oriented to person, place, and time.       Assessment & Plan:   [name] was seen today for medical management of  chronic issues.  Diagnoses and all orders for this visit:  Hyperlipidemia with target LDL less than 100 -     Lipid panel -     atorvastatin (LIPITOR) 40 MG tablet; Take 1 tablet (40 mg total) by mouth daily.  Prediabetes -     Bayer DCA Hb A1c Waived -     CBC with Differential/Platelet -     CMP14+EGFR  Recurrent major depressive disorder, in remission (HCC) -     escitalopram (LEXAPRO) 20 MG tablet; Take 1 tablet (20 mg total) by mouth daily.  Anxiety -     escitalopram (LEXAPRO) 20 MG tablet; Take 1 tablet (20 mg total) by mouth daily.  Seasonal allergies -     Azelastine-Fluticasone 137-50 MCG/ACT SUSP; Place 2 sprays into the nose  every 12 (twelve) hours.       I have changed [name]'s atorvastatin and escitalopram. I am also having him maintain his multivitamin with minerals, nitroGLYCERIN, Vitamin D, aspirin EC, acetaminophen, levocetirizine, and Azelastine-Fluticasone.  Allergies as of 11/29/2022   No Known Allergies      Medication List        Accurate as of November 29, 2022  9:01 AM. If you have any questions, ask your nurse or doctor.          acetaminophen 500 MG tablet Commonly known as: TYLENOL Take 1,000 mg by mouth every 6 (six) hours as needed.   aspirin EC 81 MG tablet Take 81 mg by mouth every morning.   atorvastatin 40 MG tablet Commonly known as: LIPITOR Take 1 tablet (40 mg total) by mouth daily.   Azelastine-Fluticasone 137-50 MCG/ACT Susp Place 2 sprays into the nose every 12 (twelve) hours.   escitalopram 20 MG tablet Commonly known as: LEXAPRO Take 1 tablet (20 mg total) by mouth daily.   levocetirizine 5 MG tablet Commonly known as: XYZAL TAKE 1 TABLET BY MOUTH EVERY DAY IN THE EVENING   multivitamin with minerals tablet Take 1 tablet by mouth daily.   nitroGLYCERIN 0.4 MG SL tablet Commonly known as: NITROSTAT Place 0.4 mg under the tongue every 5 (five) minutes as needed. Reported on 04/01/2015   Vitamin D 50 MCG (2000 UT) Caps Take 1 capsule by mouth daily.         Follow-up: Return in about 6 months (around 05/30/2023).  Mechele Claude, M.D.

## 2022-12-03 NOTE — Progress Notes (Signed)
Hello [place],  Your lab result is normal and/or stable.Some minor variations that are not significant are commonly marked abnormal, but do not represent any medical problem for you.  Best regards, Jamian Andujo, M.D.

## 2023-01-23 ENCOUNTER — Other Ambulatory Visit: Payer: Self-pay | Admitting: Family Medicine

## 2023-01-23 DIAGNOSIS — J302 Other seasonal allergic rhinitis: Secondary | ICD-10-CM

## 2023-05-06 NOTE — Progress Notes (Deleted)
  Cardiology Office Note:   Date:  05/06/2023  ID:  [name], [name] March 22, 1945, MRN [id number] PCP: Mechele Claude, MD  [facility] Providers Cardiologist:  None {  History of Present Illness:   [name] is a 78 y.o. male followup of CAD.   He presents for follow up. He has had Taxus stenting to the LAD in 2005.  Cath in 2008 demonstrated ostial 40 - 50% with a patent stent with 40% circ stenosis.    ***   s/p Taxus stenting to the LAD. This was 2005. Catheterization in 2008, demonstrated a proximal calcification with ostial 40-50% stenosis, it was a widely patent stent, the circumflex had 40% proximal stenosis, the right coronary artery is large and dominant with minor nonobstructive plaque, the EF was 55%.   *** Since I last saw him he has done okay.  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  He walks his dog. The patient denies any new symptoms such as chest discomfort, neck or arm discomfort. There has been no new shortness of breath, PND or orthopnea. There have been no reported palpitations, presyncope or syncope.   ROS: ***  Studies Reviewed:    EKG:       ***  Risk Assessment/Calculations:   {Does this patient have ATRIAL FIBRILLATION?:[id number]} No BP recorded.  {Refresh Note OR Click here to enter BP  :1}***        Physical Exam:   VS:  There were no vitals taken for this visit.   Wt Readings from Last 3 Encounters:  11/29/22 222 lb 3.2 oz (100.8 kg)  06/12/22 225 lb (102.1 kg)  05/29/22 225 lb 6.4 oz (102.2 kg)     GEN: Well nourished, well developed in no acute distress NECK: No JVD; No carotid bruits CARDIAC: ***RR, *** murmurs, rubs, gallops RESPIRATORY:  Clear to auscultation without rales, wheezing or rhonchi  ABDOMEN: Soft, non-tender, non-distended EXTREMITIES:  No edema; No deformity   ASSESSMENT AND PLAN:   CAD:   ***  The patient has no new sypmtoms.  No further cardiovascular testing is indicated.  We will continue with aggressive risk reduction and  meds as listed.   CAROTID STENOSIS:   ***   It has been many years since carotid Doppler and he had some very mild stenosis then.  I will repeat carotid Dopplers.    DYSLIPIDEMIA:     LDL last year was ***  56 with an HDL of 40.  No change in therapy.      Follow up ***  Signed, Rollene Rotunda, MD

## 2023-05-08 ENCOUNTER — Telehealth: Payer: Self-pay | Admitting: *Deleted

## 2023-05-08 NOTE — Telephone Encounter (Signed)
 Spoke with patient regarding scheduled appointment on 05/09/23 with Dr. Antoine Poche in [place]. Pt agreeable to rescheduling appointment to 05/23/23 at 9:00am. His daughter (whom he lives with) just tested positive for Covid and he is feeling under the weather. He has tested negative twice so far. Pt aware to call with any concerns prior to his appointment. He denies questions at this time.

## 2023-05-09 ENCOUNTER — Ambulatory Visit: Payer: PPO | Admitting: Cardiology

## 2023-05-09 DIAGNOSIS — E785 Hyperlipidemia, unspecified: Secondary | ICD-10-CM

## 2023-05-09 DIAGNOSIS — I2510 Atherosclerotic heart disease of native coronary artery without angina pectoris: Secondary | ICD-10-CM

## 2023-05-09 DIAGNOSIS — I6529 Occlusion and stenosis of unspecified carotid artery: Secondary | ICD-10-CM

## 2023-05-22 NOTE — Progress Notes (Unsigned)
  Cardiology Office Note:   Date:  05/23/2023  ID:  [name], DOB February 23, 1945, MRN [id number] PCP: Mechele Claude, MD  [facility] Providers Cardiologist:  None {  History of Present Illness:   [name] is a 78 y.o. male followup of CAD.   He presents for follow up. He has had Taxus stenting to the LAD in 2005.  Cath in 2008 demonstrated ostial 40 - 50% with a patent stent with 40% circ stenosis.    He had stenting with a Taxus stent to his LAD in 2005.  Cath demonstrated proximal ostial 40 to 50% stenosis with a widely patent stent.  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  He walks his dog.  He walks up an incline in his yard.  He does get a little bit short of breath with this as to recover but he says this has been slowly progressive.  He denies any chest pressure, neck or arm discomfort.  He had no weight gain or edema.  He is in no palpitations, presyncope or syncope.  ROS: As stated in the HPI and negative for all other systems.  Studies Reviewed:    EKG:   EKG Interpretation Date/Time:  Wednesday May 23 2023 09:15:00 EDT Ventricular Rate:  65 PR Interval:  194 QRS Duration:  86 QT Interval:  400 QTC Calculation: 416 R Axis:   27  Text Interpretation: Normal sinus rhythm When compared with ECG of 16-Oct-2006 05:16, No significant change was found Confirmed by Rollene Rotunda ([id number]) on 05/23/2023 9:20:35 AM    Risk Assessment/Calculations:              Physical Exam:   VS:  BP 128/72   Pulse 65   Ht 5\' 11"  (1.803 m)   Wt 223 lb (101.2 kg)   BMI 31.10 kg/m    Wt Readings from Last 3 Encounters:  05/23/23 223 lb (101.2 kg)  11/29/22 222 lb 3.2 oz (100.8 kg)  06/12/22 225 lb (102.1 kg)     GEN: Well nourished, well developed in no acute distress NECK: No JVD; No carotid bruits CARDIAC: RRR, no murmurs, rubs, gallops RESPIRATORY:  Clear to auscultation without rales, wheezing or rhonchi  ABDOMEN: Soft, non-tender,  non-distended EXTREMITIES:  No edema; No deformity   ASSESSMENT AND PLAN:   CAD:   The patient has no new sypmtoms.  No further cardiovascular testing is indicated.  We will continue with aggressive risk reduction and meds as listed.   CAROTID STENOSIS:   This was moderate last year and he is due for follow up Doppler.  I will order this.    DYSLIPIDEMIA:     LDL last year was 92.  I would like the LDL to be 55.  I will increase Lipitor to 80 mg daily and repeat a lipid profile in 3 months.  I will also order an LPa.       Follow up with me in 18 months.   Signed, Rollene Rotunda, MD

## 2023-05-23 ENCOUNTER — Encounter: Payer: Self-pay | Admitting: Cardiology

## 2023-05-23 ENCOUNTER — Ambulatory Visit: Admitting: Cardiology

## 2023-05-23 ENCOUNTER — Other Ambulatory Visit: Payer: Self-pay | Admitting: *Deleted

## 2023-05-23 VITALS — BP 128/72 | HR 65 | Ht 71.0 in | Wt 223.0 lb

## 2023-05-23 DIAGNOSIS — I2510 Atherosclerotic heart disease of native coronary artery without angina pectoris: Secondary | ICD-10-CM

## 2023-05-23 DIAGNOSIS — Z79899 Other long term (current) drug therapy: Secondary | ICD-10-CM

## 2023-05-23 DIAGNOSIS — I6529 Occlusion and stenosis of unspecified carotid artery: Secondary | ICD-10-CM

## 2023-05-23 DIAGNOSIS — E785 Hyperlipidemia, unspecified: Secondary | ICD-10-CM

## 2023-05-23 MED ORDER — ATORVASTATIN CALCIUM 80 MG PO TABS: 80.0000 mg | ORAL_TABLET | Freq: Every day | ORAL | 3 refills | Status: DC

## 2023-05-23 NOTE — Patient Instructions (Addendum)
 Medication Instructions:  Please increase your Atorvastatin to 80 mg a day. Continue all other medications as listed.  *If you need a refill on your cardiac medications before your next appointment, please call your pharmacy*  Lab Work: Please have blood work in 3 months (Lipid, LPa)  If you have labs (blood work) drawn today and your tests are completely normal, you will receive your results only by: MyChart Message (if you have MyChart) OR A paper copy in the mail If you have any lab test that is abnormal or we need to change your treatment, we will call you to review the results.  Testing/Procedures: Your physician has requested that you have a carotid duplex. This test is an ultrasound of the carotid arteries in your neck. It looks at blood flow through these arteries that supply the brain with blood. Allow one hour for this exam. There are no restrictions or special instructions.   Follow-Up: At [facility], you and your health needs are our priority.  As part of our continuing mission to provide you with exceptional heart care, our providers are all part of one team.  This team includes your primary Cardiologist (physician) and Advanced Practice Providers or APPs (Physician Assistants and Nurse Practitioners) who all work together to provide you with the care you need, when you need it.  Your next appointment:   18 month(s)  Provider:   Rollene Rotunda, MD    We recommend signing up for the patient portal called "MyChart".  Sign up information is provided on this After Visit Summary.  MyChart is used to connect with patients for Virtual Visits (Telemedicine).  Patients are able to view lab/test results, encounter notes, upcoming appointments, etc.  Non-urgent messages can be sent to your provider as well.   To learn more about what you can do with MyChart, go to [email or web address].

## 2023-05-30 ENCOUNTER — Ambulatory Visit: Payer: PPO | Admitting: Family Medicine

## 2023-05-30 DIAGNOSIS — R7303 Prediabetes: Secondary | ICD-10-CM

## 2023-05-30 DIAGNOSIS — I2510 Atherosclerotic heart disease of native coronary artery without angina pectoris: Secondary | ICD-10-CM

## 2023-05-30 DIAGNOSIS — E785 Hyperlipidemia, unspecified: Secondary | ICD-10-CM

## 2023-05-31 ENCOUNTER — Ambulatory Visit (HOSPITAL_COMMUNITY)
Admission: RE | Admit: 2023-05-31 | Discharge: 2023-05-31 | Disposition: A | Discharge status: Home / Self Care | Source: Ambulatory Visit | Attending: Cardiology | Admitting: Cardiology

## 2023-05-31 DIAGNOSIS — I6523 Occlusion and stenosis of bilateral carotid arteries: Secondary | ICD-10-CM | POA: Diagnosis not present

## 2023-05-31 DIAGNOSIS — I6529 Occlusion and stenosis of unspecified carotid artery: Secondary | ICD-10-CM | POA: Insufficient documentation

## 2023-06-01 ENCOUNTER — Encounter: Payer: Self-pay | Admitting: *Deleted

## 2023-06-04 ENCOUNTER — Other Ambulatory Visit: Payer: Self-pay | Admitting: *Deleted

## 2023-06-04 DIAGNOSIS — I6529 Occlusion and stenosis of unspecified carotid artery: Secondary | ICD-10-CM

## 2023-06-07 ENCOUNTER — Ambulatory Visit: Admitting: Family Medicine

## 2023-06-07 ENCOUNTER — Encounter: Payer: Self-pay | Admitting: Family Medicine

## 2023-06-07 VITALS — BP 112/63 | HR 59 | Temp 98.0°F | Ht 71.0 in | Wt 218.0 lb

## 2023-06-07 DIAGNOSIS — R7303 Prediabetes: Secondary | ICD-10-CM

## 2023-06-07 DIAGNOSIS — E785 Hyperlipidemia, unspecified: Secondary | ICD-10-CM

## 2023-06-07 DIAGNOSIS — J302 Other seasonal allergic rhinitis: Secondary | ICD-10-CM

## 2023-06-07 DIAGNOSIS — I6529 Occlusion and stenosis of unspecified carotid artery: Secondary | ICD-10-CM

## 2023-06-07 DIAGNOSIS — I2510 Atherosclerotic heart disease of native coronary artery without angina pectoris: Secondary | ICD-10-CM

## 2023-06-07 LAB — LIPID PANEL
Chol/HDL Ratio: 2.6 ratio (ref 0.0–5.0)
Cholesterol, Total: 108 mg/dL (ref 100–199)
HDL: 42 mg/dL (ref >39)
LDL Chol Calc (NIH): 53 mg/dL (ref 0–99)
Triglycerides: 55 mg/dL (ref 0–149)
VLDL Cholesterol Cal: 13 mg/dL (ref 5–40)

## 2023-06-07 LAB — CMP14+EGFR
ALT: 25 IU/L (ref 0–44)
AST: 29 IU/L (ref 0–40)
Albumin: 4.2 g/dL (ref 3.8–4.8)
Alkaline Phosphatase: 81 IU/L (ref 44–121)
BUN/Creatinine Ratio: 18 (ref 10–24)
BUN: 14 mg/dL (ref 8–27)
Bilirubin Total: 0.6 mg/dL (ref 0.0–1.2)
CO2: 22 mmol/L (ref 20–29)
Calcium: 9.2 mg/dL (ref 8.6–10.2)
Chloride: 104 mmol/L (ref 96–106)
Creatinine, Ser: 0.77 mg/dL (ref 0.76–1.27)
Globulin, Total: 2.4 g/dL (ref 1.5–4.5)
Glucose: 123 mg/dL — ABNORMAL HIGH (ref 70–99)
Potassium: 4.5 mmol/L (ref 3.5–5.2)
Sodium: 140 mmol/L (ref 134–144)
Total Protein: 6.6 g/dL (ref 6.0–8.5)
eGFR: 92 mL/min/{1.73_m2} (ref >59)

## 2023-06-07 LAB — CBC WITH DIFFERENTIAL/PLATELET
Basophils Absolute: 0 10*3/uL (ref 0.0–0.2)
Basos: 1 %
EOS (ABSOLUTE): 0.3 10*3/uL (ref 0.0–0.4)
Eos: 5 %
Hematocrit: 40.1 % (ref 37.5–51.0)
Hemoglobin: 13.5 g/dL (ref 13.0–17.7)
Immature Grans (Abs): 0 10*3/uL (ref 0.0–0.1)
Immature Granulocytes: 0 %
Lymphocytes Absolute: 2.4 10*3/uL (ref 0.7–3.1)
Lymphs: 35 %
MCH: 32.8 pg (ref 26.6–33.0)
MCHC: 33.7 g/dL (ref 31.5–35.7)
MCV: 97 fL (ref 79–97)
Monocytes Absolute: 0.6 10*3/uL (ref 0.1–0.9)
Monocytes: 8 %
Neutrophils Absolute: 3.4 10*3/uL (ref 1.4–7.0)
Neutrophils: 51 %
Platelets: 232 10*3/uL (ref 150–450)
RBC: 4.12 x10E6/uL — ABNORMAL LOW (ref 4.14–5.80)
RDW: 11.9 % (ref 11.6–15.4)
WBC: 6.7 10*3/uL (ref 3.4–10.8)

## 2023-06-07 LAB — BAYER DCA HB A1C WAIVED: HB A1C (BAYER DCA - WAIVED): 6 % — ABNORMAL HIGH (ref 4.8–5.6)

## 2023-06-07 MED ORDER — LEVOCETIRIZINE DIHYDROCHLORIDE 5 MG PO TABS
5.0000 mg | ORAL_TABLET | Freq: Every evening | ORAL | 1 refills | Status: DC
Start: 2023-06-07 — End: 2023-07-19

## 2023-06-07 NOTE — Progress Notes (Signed)
 Subjective:  Patient ID: [name],  male    DOB: 1945-09-10  Age: 78 y.o.    CC: Medical Management of Chronic Issues (No concerns at this time. )   HPI [name] presents for  ffollow-up of elevated cholesterol. Doing well without complaints on current medication. Denies side effects  including myalgia and arthralgia and nausea. Also in today for liver function testing. Currently no chest pain, shortness of breath or other cardiovascular related symptoms noted.  Follow-up of prediabetes. Patient does NOT check blood sugar at home. Patient denies symptoms such as excessive hunger or urinary frequency, excessive hunger, nausea No significant hypoglycemic spells noted. Medications reviewed. Pt reports taking them regularly. Pt. denies complication/adverse reaction today.   Dr. Lavonne Prairie recently repeated Carotid Doppler. Result was < 50 %     06/07/2023    8:34 AM 11/29/2022    8:27 AM 06/12/2022    2:22 PM 05/29/2022    8:11 AM 05/29/2022    8:04 AM  Depression screen PHQ 2/9  Decreased Interest 0 0 0 0 0  Down, Depressed, Hopeless 0 0 0 0 0  PHQ - 2 Score 0 0 0 0 0  Altered sleeping 0      Tired, decreased energy 0      Change in appetite 0      Feeling bad or failure about yourself  0      Trouble concentrating 0      Moving slowly or fidgety/restless 0      Suicidal thoughts 0      PHQ-9 Score 0      Difficult doing work/chores Not difficult at all        History [name] has a past medical history of Cancer [facility]), Carotid stenosis, Coronary artery disease, Coronary artery disease (CAD) excluded, Degenerative joint disease, Depression with anxiety, Dyslipidemia, Hyperlipidemia, Pneumonia, and Sinus bradycardia.   He has a past surgical history that includes orthopedic surgery; Coronary angioplasty with stent (2005?); Skin cancer excision; Hip arthroscopy; Hernia repair; Umbilical hernia repair; Colonoscopy (x3-last time 10 yrs ago); and Cataract extraction,  bilateral (Bilateral).   His family history includes COPD in his mother; Coronary artery disease in his father and another family member; Dementia in his father, paternal aunt, and paternal grandmother; Diabetes in his father.He reports that he quit smoking about 20 years ago. His smoking use included cigarettes. He started smoking about 56 years ago. He has a 36 pack-year smoking history. He has never been exposed to tobacco smoke. He has never used smokeless tobacco. He reports that he does not drink alcohol and does not use drugs.  Current Outpatient Medications on File Prior to Visit  Medication Sig Dispense Refill   acetaminophen  (TYLENOL ) 500 MG tablet Take 1,000 mg by mouth every 6 (six) hours as needed.     aspirin EC 81 MG tablet Take 81 mg by mouth every morning.       atorvastatin  (LIPITOR) 80 MG tablet Take 1 tablet (80 mg total) by mouth daily. 90 tablet 3   Azelastine -Fluticasone  137-50 MCG/ACT SUSP Place 2 sprays into the nose every 12 (twelve) hours. 23 g 2   Cholecalciferol (VITAMIN D ) 2000 UNITS CAPS Take 1 capsule by mouth daily.       escitalopram  (LEXAPRO ) 20 MG tablet Take 1 tablet (20 mg total) by mouth daily. 90 tablet 3   Multiple Vitamins-Minerals (MULTIVITAMIN WITH MINERALS) tablet Take 1 tablet by mouth daily.       nitroGLYCERIN (  NITROSTAT) 0.4 MG SL tablet Place 0.4 mg under the tongue every 5 (five) minutes as needed. Reported on 04/01/2015     No current facility-administered medications on file prior to visit.    ROS Review of Systems  Constitutional:  Negative for fever.  Respiratory:  Negative for shortness of breath.   Cardiovascular:  Negative for chest pain.  Musculoskeletal:  Negative for arthralgias.  Skin:  Negative for rash.    Objective:  BP 112/63   Pulse (!) 59   Temp 98 F (36.7 C)   Ht 5\' 11"  (1.803 m)   Wt 218 lb (98.9 kg)   SpO2 94%   BMI 30.40 kg/m   BP Readings from Last 3 Encounters:  06/07/23 112/63  05/23/23 128/72   11/29/22 130/70    Wt Readings from Last 3 Encounters:  06/07/23 218 lb (98.9 kg)  05/23/23 223 lb (101.2 kg)  11/29/22 222 lb 3.2 oz (100.8 kg)    Lab Results  Component Value Date   HGBA1C 5.8 (H) 11/29/2022   HGBA1C 6.3 (H) 05/29/2022   HGBA1C 6.0 11/07/2016    Physical Exam Vitals reviewed.  Constitutional:      Appearance: He is well-developed.  HENT:     Head: Normocephalic and atraumatic.     Right Ear: External ear normal.     Left Ear: External ear normal.     Mouth/Throat:     Pharynx: No oropharyngeal exudate or posterior oropharyngeal erythema.  Eyes:     Pupils: Pupils are equal, round, and reactive to light.  Cardiovascular:     Rate and Rhythm: Normal rate and regular rhythm.     Heart sounds: No murmur heard. Pulmonary:     Effort: No respiratory distress.     Breath sounds: Normal breath sounds.  Musculoskeletal:     Cervical back: Normal range of motion and neck supple.  Neurological:     Mental Status: He is alert and oriented to person, place, and time.         Assessment & Plan:  Hyperlipidemia with target LDL less than 100 -     CMP14+EGFR  Prediabetes -     Bayer DCA Hb A1c Waived  Stenosis of carotid artery, unspecified laterality -     CBC with Differential/Platelet  Atherosclerosis of native coronary artery of native heart without angina pectoris -     CMP14+EGFR -     Lipid panel  Seasonal allergies -     Levocetirizine Dihydrochloride ; Take 1 tablet (5 mg total) by mouth every evening.  Dispense: 90 tablet; Refill: 1    Follow-up: Return in about 6 months (around 12/07/2023) for [facility] physical.  Roise Cleaver, M.D.

## 2023-06-10 NOTE — Progress Notes (Signed)
 Hello Math,  Your lab result is normal and/or stable.Some minor variations that are not significant are commonly marked abnormal, but do not represent any medical problem for you.  Best regards, Mechele Claude, M.D.

## 2023-06-14 ENCOUNTER — Encounter: Payer: Self-pay | Admitting: *Deleted

## 2023-06-15 ENCOUNTER — Ambulatory Visit (INDEPENDENT_AMBULATORY_CARE_PROVIDER_SITE_OTHER): Payer: PPO

## 2023-06-15 VITALS — Ht 71.0 in | Wt 218.0 lb

## 2023-06-15 DIAGNOSIS — Z0000 Encounter for general adult medical examination without abnormal findings: Secondary | ICD-10-CM | POA: Diagnosis not present

## 2023-06-15 NOTE — Progress Notes (Signed)
 Please attest and cosign this visit due to patients primary care provider not being in the office at the time the visit was completed.   Subjective:   [name] is a 78 y.o. who presents for a Medicare Wellness preventive visit.  Visit Complete: Virtual I connected with  Army Best on 06/15/23 by a audio enabled telemedicine application and verified that I am speaking with the correct person using two identifiers.  Patient Location: Home  Provider Location: Home Office  I discussed the limitations of evaluation and management by telemedicine. The patient expressed understanding and agreed to proceed.  Vital Signs: Because this visit was a virtual/telehealth visit, some criteria may be missing or patient reported. Any vitals not documented were not able to be obtained and vitals that have been documented are patient reported.  VideoDeclined- This patient declined Librarian, academic. Therefore the visit was completed with audio only.  Persons Participating in Visit: Patient.  AWV Questionnaire: No: Patient Medicare AWV questionnaire was not completed prior to this visit.  Cardiac Risk Factors include: advanced age (>86men, >61 women);dyslipidemia;hypertension;male gender;obesity (BMI >30kg/m2);Other (see comment), Risk factor comments: CAD     Objective:    Today's Vitals   06/15/23 1515  Weight: 218 lb (98.9 kg)  Height: 5\' 11"  (1.803 m)  PainSc: 0-No pain   Body mass index is 30.4 kg/m.     06/15/2023    3:18 PM 06/12/2022    2:23 PM 10/24/2021    2:55 PM 10/21/2020    2:57 PM 04/23/2019    8:45 AM 04/27/2014    9:02 AM  Advanced Directives  Does Patient Have a Medical Advance Directive? No No No No No No  Would patient like information on creating a medical advance directive? No - Patient declined Yes (MAU/Ambulatory/Procedural Areas - Information given) No - Patient declined No - Patient declined No - Patient declined No - patient declined  information    Current Medications (verified) Outpatient Encounter Medications as of 06/15/2023  Medication Sig   acetaminophen  (TYLENOL ) 500 MG tablet Take 1,000 mg by mouth every 6 (six) hours as needed.   aspirin EC 81 MG tablet Take 81 mg by mouth every morning.     atorvastatin  (LIPITOR) 80 MG tablet Take 1 tablet (80 mg total) by mouth daily.   Azelastine -Fluticasone  137-50 MCG/ACT SUSP Place 2 sprays into the nose every 12 (twelve) hours.   Cholecalciferol (VITAMIN D ) 2000 UNITS CAPS Take 1 capsule by mouth daily.     escitalopram  (LEXAPRO ) 20 MG tablet Take 1 tablet (20 mg total) by mouth daily.   levocetirizine (XYZAL ) 5 MG tablet Take 1 tablet (5 mg total) by mouth every evening.   Multiple Vitamins-Minerals (MULTIVITAMIN WITH MINERALS) tablet Take 1 tablet by mouth daily.     nitroGLYCERIN (NITROSTAT) 0.4 MG SL tablet Place 0.4 mg under the tongue every 5 (five) minutes as needed. Reported on 04/01/2015   No facility-administered encounter medications on file as of 06/15/2023.    Allergies (verified) Patient has no known allergies.   History: Past Medical History:  Diagnosis Date   Cancer (HCC)    Skin   Carotid stenosis    Coronary artery disease    s/p Taxus stenting to the LAD.  This was 2005. Catheterization in 2008, demonstrated a proximal calcification with ostial 40-50% stenosis, it was a widely patent stent, the circumflex had 40%  proximal stenosis,  the right coronary artery is large and dominant with minor nonobstructive plaque,  the EF was 55%.   Coronary artery disease (CAD) excluded    Degenerative joint disease    Depression with anxiety    Dyslipidemia    Hyperlipidemia    Pneumonia    Sinus bradycardia    Past Surgical History:  Procedure Laterality Date   CATARACT EXTRACTION, BILATERAL Bilateral    COLONOSCOPY  x3-last time 10 yrs ago   [facility]   CORONARY ANGIOPLASTY WITH STENT PLACEMENT  2005?   HERNIA REPAIR     HIP ARTHROSCOPY     ORTHOPEDIC  SURGERY     right foot and right hip secondary to DJD   SKIN CANCER EXCISION     UMBILICAL HERNIA REPAIR     Family History  Problem Relation Age of Onset   COPD Mother    Coronary artery disease Father        stent   Diabetes Father    Dementia Father    Dementia Paternal Aunt    Dementia Paternal Grandmother    Coronary artery disease Other        s/p MI   Colon cancer Neg Hx    Esophageal cancer Neg Hx    Rectal cancer Neg Hx    Stomach cancer Neg Hx    Colon polyps Neg Hx    Social History   Socioeconomic History   Marital status: Married    Spouse name: Not on file   Number of children: 3   Years of education: 7th Grade   Highest education level: 7th grade  Occupational History   Occupation: Retired    Comment: Personnel officer  Tobacco Use   Smoking status: Former    Current packs/day: 0.00    Average packs/day: 1 pack/day for 36.0 years (36.0 ttl pk-yrs)    Types: Cigarettes    Start date: 02/14/1967    Quit date: 02/14/2003    Years since quitting: 20.3    Passive exposure: Never   Smokeless tobacco: Never  Vaping Use   Vaping status: Never Used  Substance and Sexual Activity   Alcohol use: No    Alcohol/week: 0.0 standard drinks of alcohol    Comment: quit completely in 2005   Drug use: No   Sexual activity: Not Currently  Other Topics Concern   Not on file  Social History Narrative   Lives at home with Daughter Valinda Gault) and Dietra Fraction (Chaz)-he is still legally married but has lived separate from wife for over 20 years   Social Drivers of Corporate investment banker Strain: Low Risk  (06/15/2023)   Overall Financial Resource Strain (CARDIA)    Difficulty of Paying Living Expenses: Not hard at all  Food Insecurity: No Food Insecurity (06/15/2023)   Hunger Vital Sign    Worried About Running Out of Food in the Last Year: Never true    Ran Out of Food in the Last Year: Never true  Transportation Needs: No Transportation Needs (06/15/2023)   PRAPARE -  Administrator, Civil Service (Medical): No    Lack of Transportation (Non-Medical): No  Physical Activity: Sufficiently Active (06/15/2023)   Exercise Vital Sign    Days of Exercise per Week: 7 days    Minutes of Exercise per Session: 30 min  Stress: No Stress Concern Present (06/15/2023)   [employer] of Occupational Health - Occupational Stress Questionnaire    Feeling of Stress : Not at all  Social Connections: Socially Integrated (06/15/2023)   Social Connection and Isolation Panel [NHANES]  Frequency of Communication with Friends and Family: More than three times a week    Frequency of Social Gatherings with Friends and Family: More than three times a week    Attends Religious Services: More than 4 times per year    Active Member of [employer] or Organizations: Yes    Attends Engineer, structural: More than 4 times per year    Marital Status: Married    Tobacco Counseling Counseling given: Yes    Clinical Intake:  Pre-visit preparation completed: Yes  Pain : No/denies pain Pain Score: 0-No pain     BMI - recorded: 30.4 Nutritional Status: BMI > 30  Obese Nutritional Risks: None Diabetes: No  Lab Results  Component Value Date   HGBA1C 6.0 (H) 06/07/2023   HGBA1C 5.8 (H) 11/29/2022   HGBA1C 6.3 (H) 05/29/2022     How often do you need to have someone help you when you read instructions, pamphlets, or other written materials from your doctor or pharmacy?: 1 - Never  Interpreter Needed?: No  Information entered by :: Sally Crazier CMA   Activities of Daily Living     06/15/2023    3:17 PM  In your present state of health, do you have any difficulty performing the following activities:  Hearing? 1  Comment has a slight difficulty hearing but declines a referral at this time.  Vision? 0  Difficulty concentrating or making decisions? 0  Walking or climbing stairs? 0  Dressing or bathing? 0  Doing errands, shopping? 0  Preparing Food and eating ?  N  Using the Toilet? N  In the past six months, have you accidently leaked urine? N  Do you have problems with loss of bowel control? N  Managing your Medications? N  Managing your Finances? N  Housekeeping or managing your Housekeeping? N    Patient Care Team: Roise Cleaver, MD as PCP - General (Family Medicine) Buck Carbon, [place], [place] as Referring Physician (Optometry)  Indicate any recent Medical Services you may have received from other than [facility] providers in the past year (date may be approximate).     Assessment:   This is a routine wellness examination for [facility].  Hearing/Vision screen Hearing Screening - Comments:: C/o slight hearing difficulty. Declines referral today Vision Screening - Comments:: Patient wears reading glasses only. Up to date with yearly exams.  Sees Dr. Buck Carbon at [facility] in [place]   Goals Addressed             This Visit's Progress    DIET - INCREASE WATER INTAKE   On track    Try to drink 6-8 glasses of water daily     Remain active and independent   On track      Depression Screen     06/15/2023    3:05 PM 06/07/2023    8:34 AM 11/29/2022    8:27 AM 06/12/2022    2:22 PM 05/29/2022    8:11 AM 05/29/2022    8:04 AM 11/24/2021    8:39 AM  PHQ 2/9 Scores  PHQ - 2 Score 0 0 0 0 0 0 0  PHQ- 9 Score 0 0         Fall Risk     06/15/2023    3:19 PM 11/29/2022    8:27 AM 06/12/2022    2:21 PM 05/29/2022    8:14 AM 05/29/2022    8:11 AM  Fall Risk   Falls in the past year? 0 0 0 0  0  Number falls in past yr: 0  0    Injury with Fall? 0  0    Risk for fall due to : No Fall Risks  No Fall Risks    Follow up Falls prevention discussed;Falls evaluation completed  Falls prevention discussed;Education provided;Falls evaluation completed      MEDICARE RISK AT HOME:  Medicare Risk at Home Any stairs in or around the home?: Yes If so, are there any without handrails?: No Home free of loose throw rugs in walkways, pet beds, electrical cords, etc?:  Yes Adequate lighting in your home to reduce risk of falls?: Yes Life alert?: No Use of a cane, walker or w/c?: No Grab bars in the bathroom?: No Shower chair or bench in shower?: No Elevated toilet seat or a handicapped toilet?: No  TIMED UP AND GO:  Was the test performed?  No  Cognitive Function: 6CIT completed    04/22/2018    9:26 AM  MMSE - Mini Mental State Exam  Orientation to time 5  Orientation to Place 5  Registration 3  Attention/ Calculation 4  Recall 3  Language- name 2 objects 2  Language- repeat 1  Language- follow 3 step command 3  Language- read & follow direction 1  Write a sentence 1  Copy design 1  Total score 29        06/15/2023    3:19 PM 06/12/2022    2:24 PM 10/24/2021    2:52 PM 04/23/2019    8:51 AM  6CIT Screen  What Year? 0 points 0 points 0 points 0 points  What month? 0 points 0 points 0 points 0 points  What time? 0 points 0 points 0 points 0 points  Count back from 20 0 points 0 points 0 points 0 points  Months in reverse 0 points 0 points 0 points 0 points  Repeat phrase 0 points 0 points 0 points 2 points  Total Score 0 points 0 points 0 points 2 points    Immunizations Immunization History  Administered Date(s) Administered   Fluad Quad(high Dose 65+) 11/26/2018, 11/01/2020, 11/24/2021   Fluad Trivalent(High Dose 65+) 11/29/2022   Influenza Inj Mdck Quad Pf 11/26/2018   Influenza, High Dose Seasonal PF 01/03/2016, 12/11/2016, 11/26/2017   Influenza,inj,Quad PF,6+ Mos 12/10/2012, 11/19/2013, 12/09/2014   Influenza-Unspecified 12/08/2019   Moderna Sars-Covid-2 Vaccination 03/25/2019, 04/22/2019, 01/15/2020   Pneumococcal Conjugate-13 10/27/2013   Pneumococcal Polysaccharide-23 06/09/2015   Tdap 01/27/2011, 04/28/2021   Zoster Recombinant(Shingrix) 04/22/2018, 11/01/2020    Screening Tests Health Maintenance  Topic Date Due   COVID-19 Vaccine (4 - 2024-25 season) 06/23/2023 (Originally 10/15/2022)   INFLUENZA VACCINE   09/14/2023   Medicare Annual Wellness (AWV)  06/14/2024   DTaP/Tdap/Td (3 - Td or Tdap) 04/29/2031   Pneumonia Vaccine 44+ Years old  Completed   Hepatitis C Screening  Completed   Zoster Vaccines- Shingrix  Completed   HPV VACCINES  Aged Out   Meningococcal B Vaccine  Aged Out   Colonoscopy  Discontinued    Health Maintenance  There are no preventive care reminders to display for this patient. Health Maintenance Items Addressed: Health Maintenance is up to date.  Patient advised of recommended vaccines with verbal understanding.   Additional Screening:  Vision Screening: Recommended annual ophthalmology exams for early detection of glaucoma and other disorders of the eye.  Dental Screening: Recommended annual dental exams for proper oral hygiene  Community Resource Referral / Chronic Care Management: CRR required this visit?  No   CCM required this visit?  No     Plan:     I have personally reviewed and noted the following in the patient's chart:   Medical and social history Use of alcohol, tobacco or illicit drugs  Current medications and supplements including opioid prescriptions. Patient is not currently taking opioid prescriptions. Functional ability and status Nutritional status Physical activity Advanced directives List of other physicians Hospitalizations, surgeries, and ER visits in previous 12 months Vitals Screenings to include cognitive, depression, and falls Referrals and appointments  In addition, I have reviewed and discussed with patient certain preventive protocols, quality metrics, and best practice recommendations. A written personalized care plan for preventive services as well as general preventive health recommendations were provided to patient.      Lorry Anastasi, CMA   06/15/2023   After Visit Summary: (Mail) Due to this being a telephonic visit, the after visit summary with patients personalized plan was offered to patient via mail    Notes: Nothing significant to report at this time.

## 2023-06-15 NOTE — Patient Instructions (Signed)
 [name] , Thank you for taking time to come for your Medicare Wellness Visit. I appreciate your ongoing commitment to your health goals. Please review the following plan we discussed and let me know if I can assist you in the future.   Referrals/Orders/Follow-Ups/Clinician Recommendations:    Next Medicare AWV: Jun 18, 2023 at 10:00 am telephone visit.      Aim for 30 minutes of exercise or brisk walking, 6-8 glasses of water, and 5 servings of fruits and vegetables each day.   This is a list of the screening recommended for you and due dates:  Health Maintenance  Topic Date Due   COVID-19 Vaccine (4 - 2024-25 season) 06/23/2023*   Flu Shot  09/14/2023   Medicare Annual Wellness Visit  06/14/2024   DTaP/Tdap/Td vaccine (3 - Td or Tdap) 04/29/2031   Pneumonia Vaccine  Completed   Hepatitis C Screening  Completed   Zoster (Shingles) Vaccine  Completed   HPV Vaccine  Aged Out   Meningitis B Vaccine  Aged Out   Colon Cancer Screening  Discontinued  *Topic was postponed. The date shown is not the original due date.    Advanced directives: (Declined) Advance directive discussed with you today. Even though you declined this today, please call our office should you change your mind, and we can give you the proper paperwork for you to fill out. Advance Care Planning is important because it:  [x]  Makes sure you receive the medical care that is consistent with your values, goals, and preferences  [x]  It provides guidance to your family and loved ones and it also reduces their decisional burden about whether or not they are making the right decisions based on what you want done  Follow the link provided in your after visit summary or read over the paperwork we have mailed to you to help you started getting your Advance Directives in place. If you need assistance in completing these, please reach out to us  so that we can help you!  Next Medicare Annual Wellness Visit scheduled for next year:  yes  Understanding Your Risk for Falls Millions of people have serious injuries from falls each year. It is important to understand your risk of falling. Talk with your health care provider about your risk and what you can do to lower it. If you do have a serious fall, make sure to tell your provider. Falling once raises your risk of falling again. How can falls affect me? Serious injuries from falls are common. These include: Broken bones, such as hip fractures. Head injuries, such as traumatic brain injuries (TBI) or concussions. A fear of falling can cause you to avoid activities and stay at home. This can make your muscles weaker and raise your risk for a fall. What can increase my risk? There are a number of risk factors that increase your risk for falling. The more risk factors you have, the higher your risk of falling. Serious injuries from a fall happen most often to people who are older than 79 years old. Teenagers and young adults ages 21-29 are also at higher risk. Common risk factors include: Weakness in the lower body. Being generally weak or confused due to long-term (chronic) illness. Dizziness or balance problems. Poor vision. Medicines that cause dizziness or drowsiness. These may include: Medicines for your blood pressure, heart, anxiety, insomnia, or swelling (edema). Pain medicines. Muscle relaxants. Other risk factors include: Drinking alcohol. Having had a fall in the past. Having foot pain or  wearing improper footwear. Working at a dangerous job. Having any of the following in your home: Tripping hazards, such as floor clutter or loose rugs. Poor lighting. Pets. Having dementia or memory loss. What actions can I take to lower my risk of falling?  Physical activity Stay physically fit. Do strength and balance exercises. Consider taking a regular class to build strength and balance. Yoga and tai chi are good options. Vision Have your eyes checked every year  and your prescription for glasses or contacts updated as needed. Shoes and walking aids Wear non-skid shoes. Wear shoes that have rubber soles and low heels. Do not wear high heels. Do not walk around the house in socks or slippers. Use a cane or walker as told by your provider. Home safety Attach secure railings on both sides of your stairs. Install grab bars for your bathtub, shower, and toilet. Use a non-skid mat in your bathtub or shower. Attach bath mats securely with double-sided, non-slip rug tape. Use good lighting in all rooms. Keep a flashlight near your bed. Make sure there is a clear path from your bed to the bathroom. Use night-lights. Do not use throw rugs. Make sure all carpeting is taped or tacked down securely. Remove all clutter from walkways and stairways, including extension cords. Repair uneven or broken steps and floors. Avoid walking on icy or slippery surfaces. Walk on the grass instead of on icy or slick sidewalks. Use ice melter to get rid of ice on walkways in the winter. Use a cordless phone. Questions to ask your health care provider Can you help me check my risk for a fall? Do any of my medicines make me more likely to fall? Should I take a vitamin D  supplement? What exercises can I do to improve my strength and balance? Should I make an appointment to have my vision checked? Do I need a bone density test to check for weak bones (osteoporosis)? Would it help to use a cane or a walker? Where to find more information Centers for Disease Control and Prevention, STEADI: TonerPromos.no Community-Based Fall Prevention Programs: TonerPromos.no [employer] on Aging: BaseRingTones.pl Contact a health care provider if: You fall at home. You are afraid of falling at home. You feel weak, drowsy, or dizzy. This information is not intended to replace advice given to you by your health care provider. Make sure you discuss any questions you have with your health care  provider. Document Revised: 10/03/2021 Document Reviewed: 10/03/2021 Elsevier Patient Education  2024 [employer].

## 2023-07-19 ENCOUNTER — Other Ambulatory Visit: Payer: Self-pay | Admitting: Family Medicine

## 2023-07-19 DIAGNOSIS — J302 Other seasonal allergic rhinitis: Secondary | ICD-10-CM

## 2023-08-23 ENCOUNTER — Other Ambulatory Visit

## 2023-11-16 IMAGING — US US CAROTID DUPLEX BILAT
1 series · 13 of 24 positions shown · non-contrast
Comparison: None.

CLINICAL DATA: Follow-up carotid artery stenosis. History of CAD
(post coronary stent placement) and hyperlipidemia. Former smoker.

EXAM:
BILATERAL CAROTID DUPLEX ULTRASOUND
TECHNIQUE: Gray scale imaging, color Doppler and duplex ultrasound were
performed of bilateral carotid and vertebral arteries in the neck.

[Series 1: us carotid bilateral · 13 of 68 slices shown]
[im 1/68]
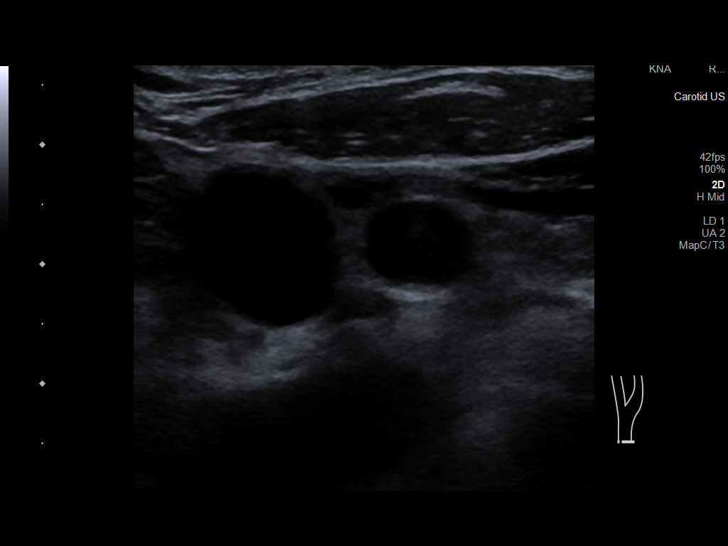
[im 6/68]
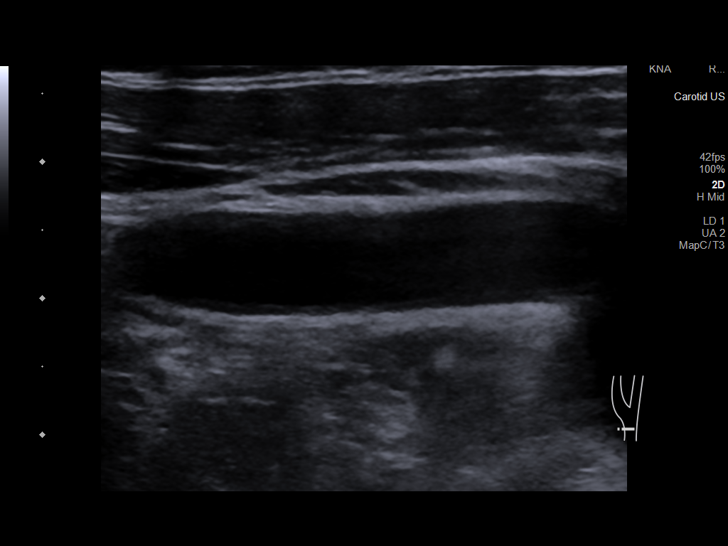
[im 12/68]
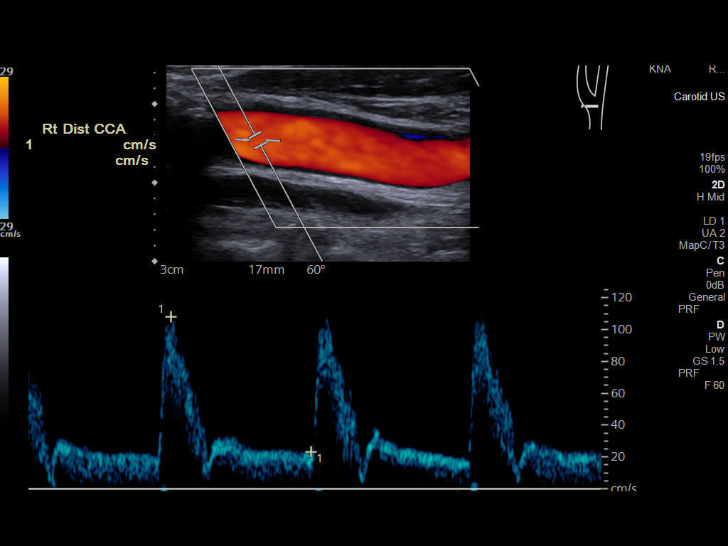
[im 18/68]
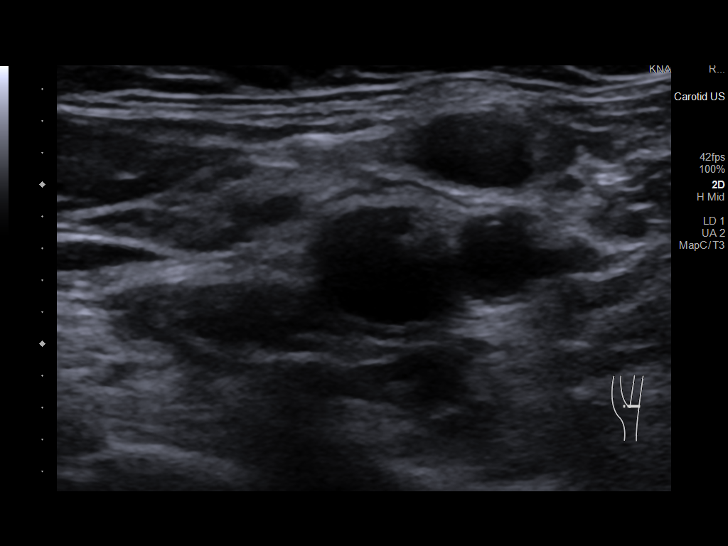
[im 24/68]
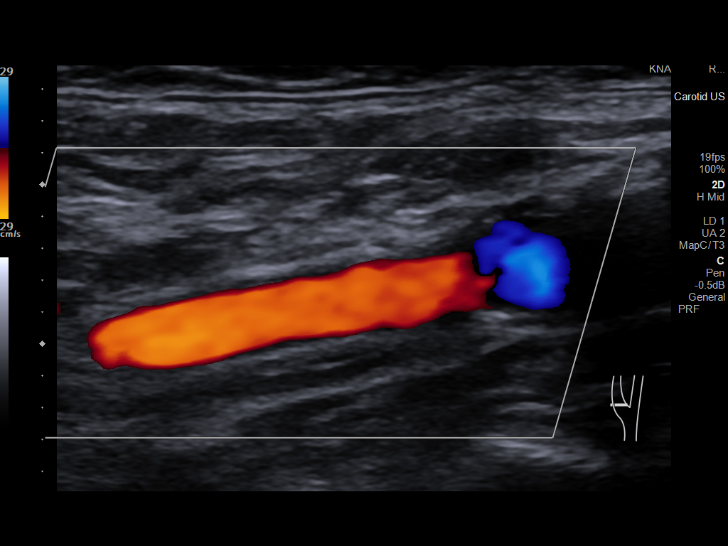
[im 30/68]
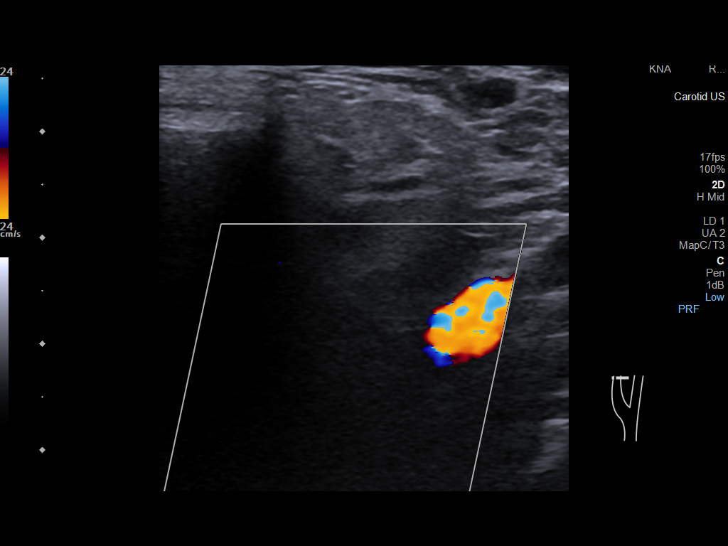
[im 35/68]
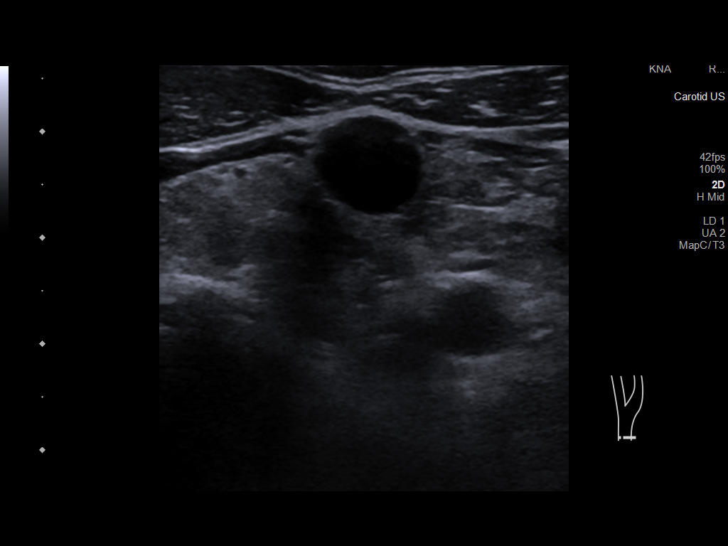
[im 38/68]
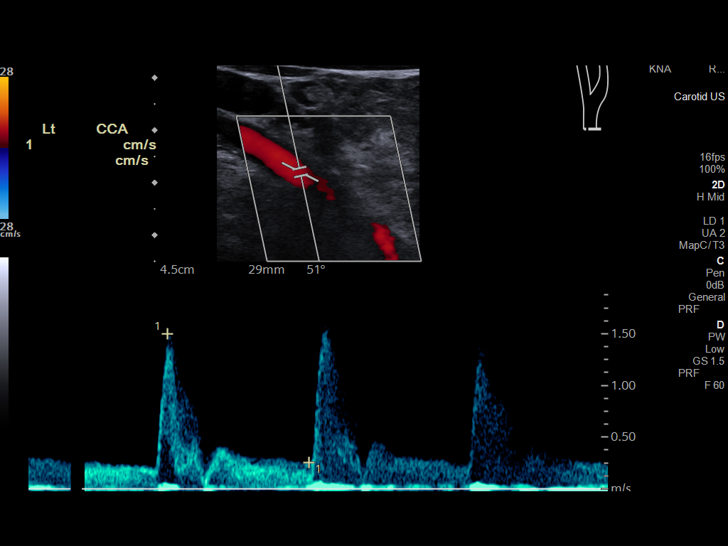
[im 44/68]
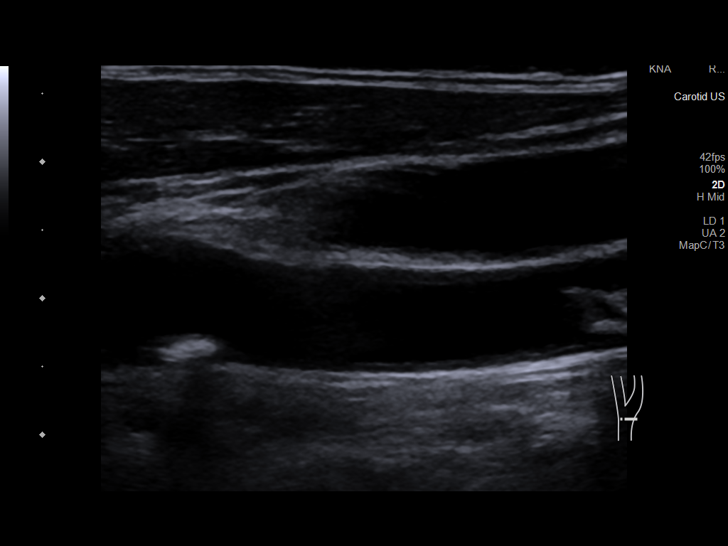
[im 50/68]
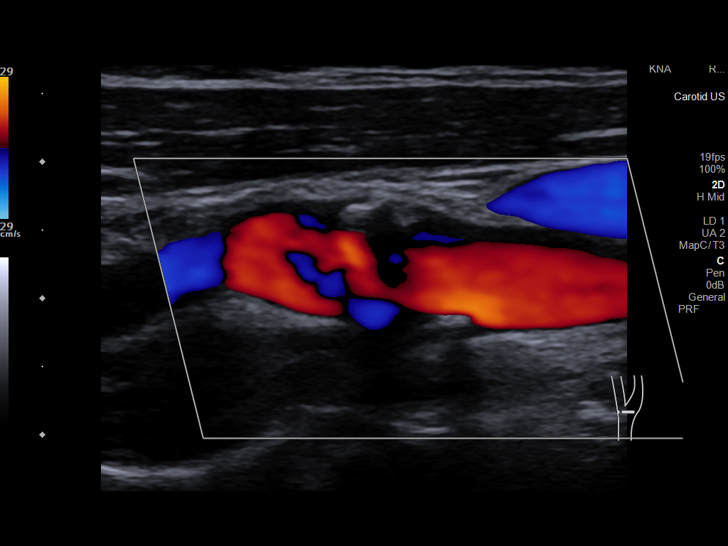
[im 56/68]
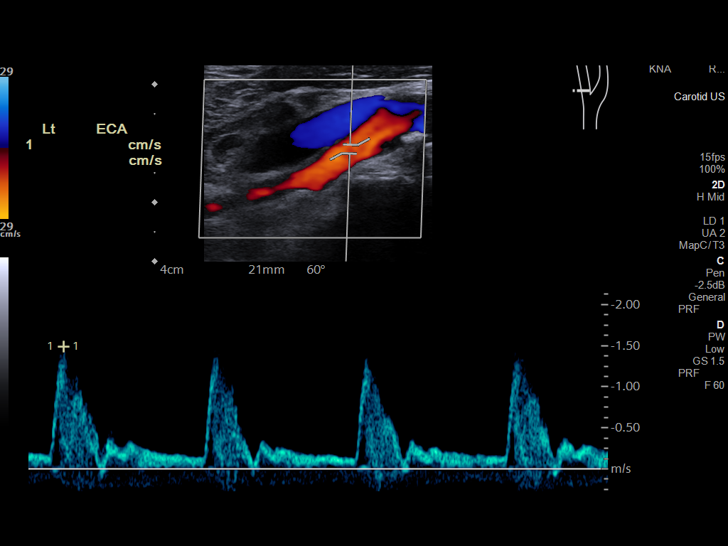
[im 62/68]
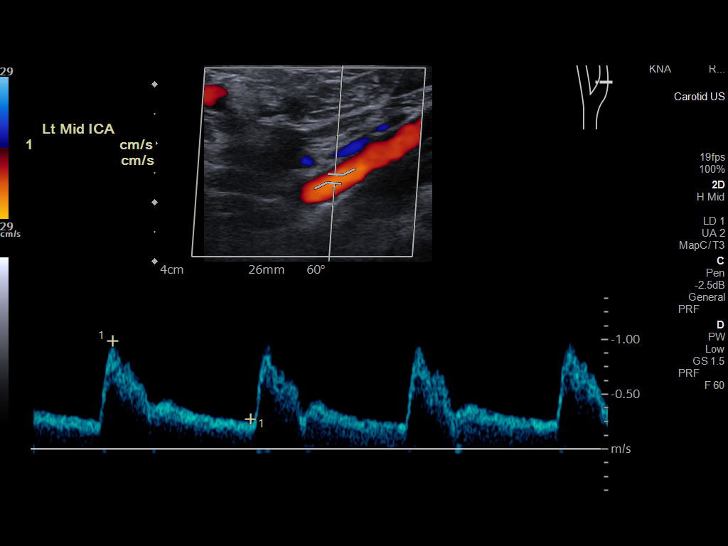
[im 68/68]
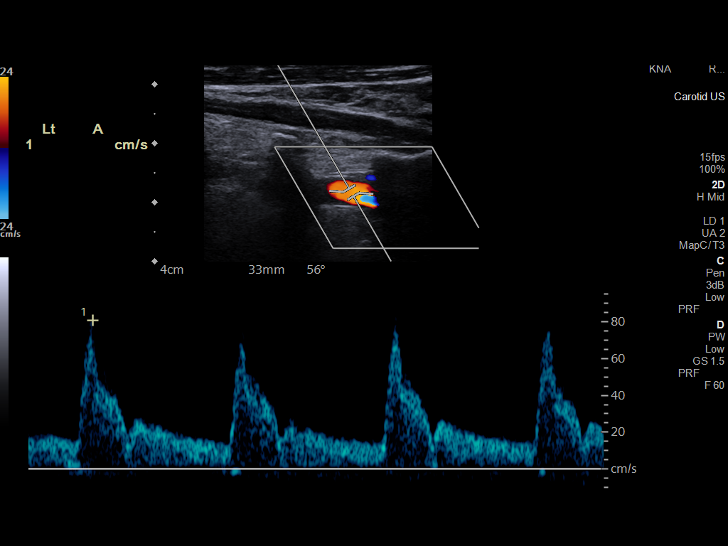

[13 of 24 positions shown; findings below may reference images not displayed]

FINDINGS: Criteria: Quantification of carotid stenosis is based on velocity
parameters that correlate the residual internal carotid diameter
with NASCET-based stenosis levels, using the diameter of the distal
internal carotid lumen as the denominator for stenosis measurement.

The following velocity measurements were obtained:

RIGHT

ICA: 87/23 cm/sec

CCA: 120/19 cm/sec

SYSTOLIC ICA/CCA RATIO:

ECA: 126 cm/sec

LEFT

ICA: 103/21 cm/sec

CCA: 147/28 cm/sec

SYSTOLIC ICA/CCA RATIO:

ECA: 149 cm/sec

RIGHT CAROTID ARTERY: There is a minimal amount of eccentric
partially shadowing plaque within the right carotid bulb (images 14
and 16), not resulting in elevated peak systolic velocities within
the interrogated course of the right internal carotid artery to
suggest a hemodynamically significant stenosis.

RIGHT VERTEBRAL ARTERY:  Antegrade Flow

LEFT CAROTID ARTERY: There is a moderate to large amount of
eccentric echogenic plaque within mid aspect the left common carotid
artery (image 41 40 and 41). There is a minimal to moderate amount
of eccentric echogenic plaque within the left carotid bulb (image
50), extending to involve the origin and proximal aspects of the
left internal carotid artery (image 58), not resulting in elevated
peak systolic velocities within the interrogated course of the left
internal carotid artery to suggest a hemodynamically significant
stenosis.

LEFT VERTEBRAL ARTERY:  Antegrade Flow
IMPRESSION: Minimal to moderate amount of bilateral atherosclerotic plaque, left
greater than right, not resulting in a hemodynamically significant
stenosis within either internal carotid artery.

## 2023-12-06 ENCOUNTER — Other Ambulatory Visit: Payer: Self-pay | Admitting: *Deleted

## 2023-12-06 ENCOUNTER — Other Ambulatory Visit

## 2023-12-06 DIAGNOSIS — E785 Hyperlipidemia, unspecified: Secondary | ICD-10-CM

## 2023-12-06 DIAGNOSIS — Z0000 Encounter for general adult medical examination without abnormal findings: Secondary | ICD-10-CM

## 2023-12-06 DIAGNOSIS — E559 Vitamin D deficiency, unspecified: Secondary | ICD-10-CM

## 2023-12-06 DIAGNOSIS — Z79899 Other long term (current) drug therapy: Secondary | ICD-10-CM | POA: Diagnosis not present

## 2023-12-06 DIAGNOSIS — R7303 Prediabetes: Secondary | ICD-10-CM

## 2023-12-06 LAB — URINALYSIS
Glucose, UA: NEGATIVE
Leukocytes,UA: NEGATIVE
Nitrite, UA: NEGATIVE
RBC, UA: NEGATIVE
Specific Gravity, UA: >=1.03 — AB (ref 1.005–1.030)
Urobilinogen, Ur: 1 mg/dL (ref 0.2–1.0)
pH, UA: 5.5 (ref 5.0–7.5)

## 2023-12-06 LAB — LIPID PANEL

## 2023-12-06 LAB — BAYER DCA HB A1C WAIVED: HB A1C (BAYER DCA - WAIVED): 5.8 % — ABNORMAL HIGH (ref 4.8–5.6)

## 2023-12-07 ENCOUNTER — Ambulatory Visit: Payer: Self-pay | Admitting: Cardiology

## 2023-12-07 DIAGNOSIS — E785 Hyperlipidemia, unspecified: Secondary | ICD-10-CM

## 2023-12-07 LAB — CMP14+EGFR
ALT: 26 IU/L (ref 0–44)
AST: 24 IU/L (ref 0–40)
Albumin: 4.5 g/dL (ref 3.8–4.8)
Alkaline Phosphatase: 96 IU/L (ref 47–123)
BUN/Creatinine Ratio: 14 (ref 10–24)
BUN: 14 mg/dL (ref 8–27)
Bilirubin Total: 0.8 mg/dL (ref 0.0–1.2)
CO2: 23 mmol/L (ref 20–29)
Calcium: 10 mg/dL (ref 8.6–10.2)
Chloride: 102 mmol/L (ref 96–106)
Creatinine, Ser: 0.98 mg/dL (ref 0.76–1.27)
Globulin, Total: 3 g/dL (ref 1.5–4.5)
Glucose: 123 mg/dL — ABNORMAL HIGH (ref 70–99)
Potassium: 4.8 mmol/L (ref 3.5–5.2)
Sodium: 139 mmol/L (ref 134–144)
Total Protein: 7.5 g/dL (ref 6.0–8.5)
eGFR: 79 mL/min/1.73 (ref >59)

## 2023-12-07 LAB — LIPID PANEL
Chol/HDL Ratio: 3.5 ratio (ref 0.0–5.0)
Cholesterol, Total: 147 mg/dL (ref 100–199)
Cholesterol, Total: 149 mg/dL (ref 100–199)
HDL: 42 mg/dL (ref >39)
HDL: 42 mg/dL (ref >39)
LDL CALC COMMENT:: 3.5 ratio (ref 0.0–5.0)
LDL Chol Calc (NIH): 85 mg/dL (ref 0–99)
LDL Chol Calc (NIH): 87 mg/dL (ref 0–99)
Triglycerides: 108 mg/dL (ref 0–149)
Triglycerides: 109 mg/dL (ref 0–149)
VLDL Cholesterol Cal: 20 mg/dL (ref 5–40)
VLDL Cholesterol Cal: 20 mg/dL (ref 5–40)

## 2023-12-07 LAB — CBC WITH DIFFERENTIAL/PLATELET
Basophils Absolute: 0 x10E3/uL (ref 0.0–0.2)
Basos: 1 %
EOS (ABSOLUTE): 0.3 x10E3/uL (ref 0.0–0.4)
Eos: 3 %
Hematocrit: 46.9 % (ref 37.5–51.0)
Hemoglobin: 15.4 g/dL (ref 13.0–17.7)
Immature Grans (Abs): 0 x10E3/uL (ref 0.0–0.1)
Immature Granulocytes: 0 %
Lymphocytes Absolute: 2.8 x10E3/uL (ref 0.7–3.1)
Lymphs: 32 %
MCH: 32.6 pg (ref 26.6–33.0)
MCHC: 32.8 g/dL (ref 31.5–35.7)
MCV: 99 fL — ABNORMAL HIGH (ref 79–97)
Monocytes Absolute: 0.7 x10E3/uL (ref 0.1–0.9)
Monocytes: 8 %
Neutrophils Absolute: 5 x10E3/uL (ref 1.4–7.0)
Neutrophils: 56 %
Platelets: 285 x10E3/uL (ref 150–450)
RBC: 4.73 x10E6/uL (ref 4.14–5.80)
RDW: 12 % (ref 11.6–15.4)
WBC: 8.7 x10E3/uL (ref 3.4–10.8)

## 2023-12-07 LAB — PSA, TOTAL AND FREE
PSA, Free Pct: 50 %
PSA, Free: 0.1 ng/mL
Prostate Specific Ag, Serum: 0.2 ng/mL (ref 0.0–4.0)

## 2023-12-07 LAB — VITAMIN D 25 HYDROXY (VIT D DEFICIENCY, FRACTURES): Vit D, 25-Hydroxy: 40.3 ng/mL (ref 30.0–100.0)

## 2023-12-10 ENCOUNTER — Encounter: Admitting: Family Medicine

## 2023-12-12 ENCOUNTER — Ambulatory Visit: Payer: Self-pay | Admitting: Family Medicine

## 2023-12-13 ENCOUNTER — Telehealth: Payer: Self-pay | Admitting: Family Medicine

## 2023-12-13 MED ORDER — ROSUVASTATIN CALCIUM 40 MG PO TABS: 40.0000 mg | ORAL_TABLET | Freq: Every day | ORAL | 3 refills | Status: AC

## 2023-12-13 NOTE — Telephone Encounter (Signed)
-----   Message from Lynwood Schilling sent at 12/07/2023  6:20 PM EDT ----- His LDL was not at goal.   I would like to stop the Lipitor and start Crestor 40 mg and repeat a lipid profile in 3 months.   Call Mr. Quillin with the results and send results to Zollie Lowers, MD ----- Message ----- From: Rebecka Memos Lab Results In Sent: 12/07/2023   5:36 AM EDT To: Lynwood Schilling, MD

## 2023-12-13 NOTE — Telephone Encounter (Signed)
 Spoke with pt regarding his results. Pt agreeable to plan. Prescription sent. Labs ordered and released. Pt verbalized understanding. All questions if any were answered.

## 2023-12-13 NOTE — Telephone Encounter (Signed)
 Spoke to patient and documented in result notes.

## 2023-12-13 NOTE — Progress Notes (Signed)
 Pt r/c.

## 2023-12-13 NOTE — Telephone Encounter (Signed)
 Copied from CRM #[id number]. Topic: Clinical - Lab/Test Results >> Dec 13, 2023  9:21 AM Gustabo D wrote: Returning missed call

## 2023-12-26 ENCOUNTER — Ambulatory Visit (INDEPENDENT_AMBULATORY_CARE_PROVIDER_SITE_OTHER): Payer: Self-pay | Admitting: Family Medicine

## 2023-12-26 ENCOUNTER — Other Ambulatory Visit: Payer: Self-pay | Admitting: Family Medicine

## 2023-12-26 ENCOUNTER — Encounter: Payer: Self-pay | Admitting: Family Medicine

## 2023-12-26 VITALS — BP 122/62 | HR 55 | Temp 97.8°F | Ht 71.0 in | Wt 214.0 lb

## 2023-12-26 DIAGNOSIS — J302 Other seasonal allergic rhinitis: Secondary | ICD-10-CM

## 2023-12-26 DIAGNOSIS — E785 Hyperlipidemia, unspecified: Secondary | ICD-10-CM

## 2023-12-26 DIAGNOSIS — R809 Proteinuria, unspecified: Secondary | ICD-10-CM | POA: Diagnosis not present

## 2023-12-26 DIAGNOSIS — F419 Anxiety disorder, unspecified: Secondary | ICD-10-CM | POA: Diagnosis not present

## 2023-12-26 DIAGNOSIS — F3340 Major depressive disorder, recurrent, in remission, unspecified: Secondary | ICD-10-CM

## 2023-12-26 DIAGNOSIS — Z0000 Encounter for general adult medical examination without abnormal findings: Secondary | ICD-10-CM

## 2023-12-26 DIAGNOSIS — Z0001 Encounter for general adult medical examination with abnormal findings: Secondary | ICD-10-CM

## 2023-12-26 DIAGNOSIS — R7303 Prediabetes: Secondary | ICD-10-CM | POA: Diagnosis not present

## 2023-12-26 DIAGNOSIS — Z23 Encounter for immunization: Secondary | ICD-10-CM

## 2023-12-26 LAB — URINALYSIS, COMPLETE
Bilirubin, UA: NEGATIVE
Glucose, UA: NEGATIVE
Leukocytes,UA: NEGATIVE
Nitrite, UA: NEGATIVE
RBC, UA: NEGATIVE
Specific Gravity, UA: >=1.03 — AB (ref 1.005–1.030)
Urobilinogen, Ur: 2 mg/dL — ABNORMAL HIGH (ref 0.2–1.0)
pH, UA: 6 (ref 5.0–7.5)

## 2023-12-26 LAB — MICROSCOPIC EXAMINATION
RBC, Urine: NONE SEEN /HPF (ref 0–2)
Renal Epithel, UA: NONE SEEN /HPF
WBC, UA: NONE SEEN /HPF (ref 0–5)
Yeast, UA: NONE SEEN

## 2023-12-26 MED ORDER — ESCITALOPRAM OXALATE 20 MG PO TABS: 20.0000 mg | ORAL_TABLET | Freq: Every day | ORAL | 3 refills | Status: AC

## 2023-12-26 MED ORDER — LEVOCETIRIZINE DIHYDROCHLORIDE 5 MG PO TABS: 5.0000 mg | ORAL_TABLET | Freq: Every evening | ORAL | 1 refills | Status: AC

## 2023-12-26 MED ORDER — AZELASTINE-FLUTICASONE 137-50 MCG/ACT NA SUSP: 2.0000 | Freq: Two times a day (BID) | NASAL | 2 refills | Status: AC

## 2023-12-26 NOTE — Telephone Encounter (Signed)
 Name from pharmacy: [id number]  Pharmacy comment: Alternative Requested:NON FORMULARY DRUG; TRIED BOTH BRAND AND GENERIC, NOT COVERED; PLESAE CONTACT INSURANCE OR CONSIDER ALTERNATIVE.

## 2023-12-26 NOTE — Progress Notes (Signed)
 `  Subjective:  Patient ID: [name], male    DOB: 1945/07/20  Age: 78 y.o. MRN: [id number]  CC: Annual Exam (Cardiology did labs and started him on rosuvastatin)   HPI  Discussed the use of AI scribe software for clinical note transcription with the patient, who gave verbal consent to proceed.  History of Present Illness SIRCHARLES Reid is a 78 year old male who presents for a Complete physical.  He recently changed his cholesterol medication from atorvastatin  40 mg to rosuvastatin 80 mg after his LDL cholesterol increased from 53 mg/dL in April to 87 mg/dL.  He has a history of essential tremor, particularly in his right hand, noticeable when sitting still. He can stop the tremor with concentration and it is not associated with stiffness. He is concerned about Parkinson's disease but notes no other symptoms such as stiffness.  He experiences joint pain, especially in the mornings, affecting his shoulders, knees, elbows, and hips. He uses Biofreeze for relief. His right shoulder is particularly bothersome in the morning but improves throughout the day.  He takes escitalopram  (Lexapro ) daily for depression and anxiety, which he finds effective. No current symptoms of depression or anxiety, although he occasionally feels nervous.  No recent chest pain and he has not used nitroglycerin in approximately 15 years, although he continues to carry it as a precaution. He reports occasional heartburn, particularly after eating certain foods like barbecue chicken nuggets, but manages this by avoiding trigger foods.  He has a history of a 90% blockage in his carotid artery, which was treated in the past.  He reports a history of frequent urination at night, which has improved since he stopped consuming alcohol. He currently urinates outside when taking his dog out at night.  He is taking a senior multivitamin with vitamin D  and a baby aspirin daily. He is also mindful of his weight, aiming to  reduce it to around 200 pounds from his current weight of 214 pounds.          06/15/2023    3:05 PM 06/07/2023    8:34 AM 11/29/2022    8:27 AM  Depression screen PHQ 2/9  Decreased Interest 0 0 0  Down, Depressed, Hopeless 0 0 0  PHQ - 2 Score 0 0 0  Altered sleeping 0 0   Tired, decreased energy 0 0   Change in appetite 0 0   Feeling bad or failure about yourself  0 0   Trouble concentrating 0 0   Moving slowly or fidgety/restless 0 0   Suicidal thoughts 0 0   PHQ-9 Score 0  0    Difficult doing work/chores Not difficult at all Not difficult at all      Data saved with a previous flowsheet row definition    History Marrion has a past medical history of Cancer [facility]), Carotid stenosis, Coronary artery disease, Coronary artery disease (CAD) excluded, Degenerative joint disease, Depression with anxiety, Dyslipidemia, Hyperlipidemia, Pneumonia, and Sinus bradycardia.   He has a past surgical history that includes orthopedic surgery; Coronary angioplasty with stent (2005?); Skin cancer excision; Hip arthroscopy; Hernia repair; Umbilical hernia repair; Colonoscopy (x3-last time 10 yrs ago); and Cataract extraction, bilateral (Bilateral).   His family history includes COPD in his mother; Coronary artery disease in his father and another family member; Dementia in his father, paternal aunt, and paternal grandmother; Diabetes in his father.He reports that he quit smoking about 20 years ago. His smoking use included cigarettes. He started  smoking about 56 years ago. He has a 36 pack-year smoking history. He has never been exposed to tobacco smoke. He has never used smokeless tobacco. He reports that he does not drink alcohol and does not use drugs.    ROS Review of Systems  Constitutional:  Negative for activity change, fatigue and unexpected weight change.  HENT:  Negative for congestion, ear pain, hearing loss, postnasal drip and trouble swallowing.   Eyes:  Negative for pain and  visual disturbance.  Respiratory:  Negative for cough, chest tightness and shortness of breath.   Cardiovascular:  Negative for chest pain, palpitations and leg swelling.  Gastrointestinal:  Negative for abdominal distention, abdominal pain, blood in stool, constipation, diarrhea, nausea and vomiting.  Endocrine: Negative for cold intolerance, heat intolerance and polydipsia.  Genitourinary:  Negative for difficulty urinating, dysuria, flank pain, frequency and urgency.  Musculoskeletal:  Negative for arthralgias and joint swelling.  Skin:  Negative for color change, rash and wound.  Neurological:  Negative for dizziness, syncope, speech difficulty, weakness, light-headedness, numbness and headaches.  Hematological:  Does not bruise/bleed easily.  Psychiatric/Behavioral:  Negative for confusion, decreased concentration, dysphoric mood and sleep disturbance. The patient is not nervous/anxious.     Objective:  BP 122/62   Pulse (!) 55   Temp 97.8 F (36.6 C)   Ht 5' 11 (1.803 m)   Wt 214 lb (97.1 kg)   SpO2 94%   BMI 29.85 kg/m   BP Readings from Last 3 Encounters:  12/26/23 122/62  06/07/23 112/63  05/23/23 128/72    Wt Readings from Last 3 Encounters:  12/26/23 214 lb (97.1 kg)  06/15/23 218 lb (98.9 kg)  06/07/23 218 lb (98.9 kg)     Physical Exam Constitutional:      Appearance: He is well-developed.  HENT:     Head: Normocephalic and atraumatic.  Eyes:     Pupils: Pupils are equal, round, and reactive to light.  Neck:     Thyroid : No thyromegaly.     Trachea: No tracheal deviation.  Cardiovascular:     Rate and Rhythm: Normal rate and regular rhythm.     Heart sounds: Normal heart sounds. No murmur heard.    No friction rub. No gallop.  Pulmonary:     Breath sounds: Normal breath sounds. No wheezing or rales.  Abdominal:     General: Bowel sounds are normal. There is no distension.     Palpations: Abdomen is soft. There is no mass.     Tenderness: There is  no abdominal tenderness.     Hernia: There is no hernia in the left inguinal area.  Genitourinary:    Penis: Normal.      Testes: Normal.  Musculoskeletal:        General: Normal range of motion.     Cervical back: Normal range of motion.  Lymphadenopathy:     Cervical: No cervical adenopathy.  Skin:    General: Skin is warm and dry.  Neurological:     Mental Status: He is alert and oriented to person, place, and time.    Physical Exam MEASUREMENTS: Height- 5'11, Weight- 214. GENERAL: Alert, cooperative, well developed, no acute distress. HEENT: Normocephalic, normal oropharynx, moist mucous membranes. CHEST: Clear to auscultation bilaterally, no wheezes, rhonchi, or crackles. CARDIOVASCULAR: Normal heart rate and rhythm, S1 and S2 normal without murmurs. Carotid artery circulation normal. ABDOMEN: Soft, non-tender, non-distended, without organomegaly, normal bowel sounds. GENITOURINARY: No inguinal hernia. EXTREMITIES: No cyanosis or edema. NEUROLOGICAL: Cranial  nerves grossly intact, moves all extremities without gross motor or sensory deficit.   Assessment & Plan:  Well adult exam  Seasonal allergies -     Azelastine -Fluticasone ; Place 2 sprays into the nose every 12 (twelve) hours.  Dispense: 23 g; Refill: 2 -     Levocetirizine Dihydrochloride ; Take 1 tablet (5 mg total) by mouth every evening.  Dispense: 90 tablet; Refill: 1  Recurrent major depressive disorder, in remission -     Escitalopram  Oxalate; Take 1 tablet (20 mg total) by mouth daily.  Dispense: 90 tablet; Refill: 3  Anxiety -     Escitalopram  Oxalate; Take 1 tablet (20 mg total) by mouth daily.  Dispense: 90 tablet; Refill: 3  Prediabetes  Hyperlipidemia with target LDL less than 100  Proteinuria, unspecified type -     Urinalysis, Complete  Encounter for immunization -     Flu vaccine HIGH DOSE PF(Fluzone Trivalent)    Assessment and Plan Assessment & Plan Adult Wellness Visit   Routine  adult wellness visit with no acute concerns. Discussed general health maintenance, including weight management. Scheduled follow-up appointment in six weeks for routine check-up.  Immunization management   Flu shot administered during the visit.  Hyperlipidemia   LDL cholesterol at 87 mg/dL, above the target of 70 mg/dL for heart protection. Recently switched from atorvastatin  to rosuvastatin due to suboptimal LDL levels. Continue rosuvastatin as prescribed.  Proteinuria   Previous urine test showed proteinuria. Rechecked urine to monitor kidney function.  Seasonal allergic rhinitis   Managed with nasal spray and levocetirizine (Xyzal ).  Depression and anxiety disorder   Managed with escitalopram  (Lexapro ). No current symptoms of depression or anxiety. Occasional nervousness noted, but not indicative of anxiety disorder.  Essential tremor   Intermittent tremor in the right hand, possibly essential tremor. No signs of Parkinson's disease. Tremor can be controlled with concentration.  Arthralgia and morning stiffness   Morning stiffness and joint pain, particularly in the right shoulder, likely due to arthritis. Symptoms improve with movement. Use Biofreeze for symptomatic relief. Consider Tylenol , two extra strength tablets three times a day, for pain management. Use a heating pad before getting out of bed to alleviate morning stiffness.  Heartburn   Occasional heartburn associated with certain foods, such as barbecue and pizza. Symptoms manageable with dietary modifications. Avoid foods that trigger heartburn. Will consider medication if symptoms become frequent or severe.       Follow-up: Return in about 6 months (around 06/24/2024) for CAD, cholesterol.  Butler Der, M.D.

## 2024-01-17 ENCOUNTER — Encounter: Payer: Self-pay | Admitting: Nurse Practitioner

## 2024-01-17 ENCOUNTER — Ambulatory Visit: Admitting: Nurse Practitioner

## 2024-01-17 VITALS — BP 149/72 | HR 71 | Temp 98.3°F | Ht 71.0 in | Wt 220.0 lb

## 2024-01-17 DIAGNOSIS — H6501 Acute serous otitis media, right ear: Secondary | ICD-10-CM

## 2024-01-17 MED ORDER — FLUTICASONE PROPIONATE 50 MCG/ACT NA SUSP: 2.0000 | Freq: Every day | NASAL | 6 refills | Status: AC

## 2024-01-17 MED ORDER — AMOXICILLIN-POT CLAVULANATE 875-125 MG PO TABS: 1.0000 | ORAL_TABLET | Freq: Two times a day (BID) | ORAL | 0 refills | Status: DC

## 2024-01-17 NOTE — Progress Notes (Signed)
   Subjective:    Patient ID: [name], male    DOB: 06-06-1945, 78 y.o.   MRN: [id number]   Chief Complaint: ear ache.   HPI  Patient in c/o right ear pain. Has been intermittent for a week. Has gotten worse in the last 24 hours. No drainage. Uses q tip and has darkwax on q tip.  Patient Active Problem List   Diagnosis Date Noted   Prediabetes 12/26/2023   Seasonal allergies 12/26/2023   Phlegmon 07/29/2019   Anxiety 05/10/2018   Seasonal allergic rhinitis due to pollen 05/10/2018   Vitamin D  deficiency 06/09/2015   BMI 31.0-31.9,adult 04/23/2009   Hyperlipidemia with target LDL less than 100 04/21/2009   Depression 04/21/2009   Coronary atherosclerosis 04/21/2009   Carotid artery stenosis 04/21/2009   Osteoarthritis 04/21/2009       Review of Systems  Constitutional:  Negative for chills and fever.  HENT:  Positive for ear pain (right). Negative for congestion, ear discharge, rhinorrhea, sinus pressure and sinus pain.   Respiratory:  Negative for cough and shortness of breath.        Objective:   Physical Exam Constitutional:      Appearance: Normal appearance.  HENT:     Right Ear: A middle ear effusion is present. Tympanic membrane is erythematous (mild).     Left Ear: Tympanic membrane and ear canal normal.     Mouth/Throat:     Mouth: Mucous membranes are dry.  Cardiovascular:     Rate and Rhythm: Normal rate and regular rhythm.     Heart sounds: Normal heart sounds.  Pulmonary:     Breath sounds: Normal breath sounds.  Skin:    General: Skin is warm.  Neurological:     General: No focal deficit present.     Mental Status: He is alert and oriented to person, place, and time.  Psychiatric:        Mood and Affect: Mood normal.        Behavior: Behavior normal.     BP (!) 149/72   Pulse 71   Temp 98.3 F (36.8 C) (Temporal)   Ht 5' 11 (1.803 m)   Wt 220 lb (99.8 kg)   SpO2 94%   BMI 30.68 kg/m        Assessment & Plan:   Debby VEAR Sharps in today with chief complaint of Ear Pain (Right ear)   1. Non-recurrent acute serous otitis media of right ear (Primary) Force fluids Motrin or tylenol  as needed Do not use q tips in ears RTO prn - amoxicillin -clavulanate (AUGMENTIN ) 875-125 MG tablet; Take 1 tablet by mouth 2 (two) times daily.  Dispense: 14 tablet; Refill: 0 - fluticasone  (FLONASE ) 50 MCG/ACT nasal spray; Place 2 sprays into both nostrils daily.  Dispense: 16 g; Refill: 6    The above assessment and management plan was discussed with the patient. The patient verbalized understanding of and has agreed to the management plan. Patient is aware to call the clinic if symptoms persist or worsen. Patient is aware when to return to the clinic for a follow-up visit. Patient educated on when it is appropriate to go to the emergency department.   Mary-Margaret Gladis, FNP

## 2024-01-17 NOTE — Patient Instructions (Signed)

## 2024-02-11 ENCOUNTER — Telehealth: Payer: Self-pay | Admitting: Family Medicine

## 2024-02-11 NOTE — Telephone Encounter (Signed)
 should not need another antibiotic- just treat symptoms

## 2024-02-11 NOTE — Telephone Encounter (Signed)
 Pt aware of provider feeback and voiced understanding.

## 2024-02-11 NOTE — Telephone Encounter (Signed)
 Copied from CRM [id number]. Topic: Clinical - Medical Advice >> Feb 11, 2024  9:06 AM Graeme ORN wrote: Reason for CRM: Patient called. States took 7 days of amoxicillin -clavulanate (AUGMENTIN ) 875-125 MG tablet. Was feeling better but is now starting to have the same symptoms. Wants to know if he needs another round of antibiotics. Thank You

## 2024-02-13 ENCOUNTER — Ambulatory Visit: Payer: Self-pay

## 2024-02-13 NOTE — Telephone Encounter (Signed)
 FYI Only or Action Required?: FYI only for provider: appointment scheduled on 02/15/24.  Patient was last seen in primary care on 01/17/2024 by Jason Mustard, FNP.  Called Nurse Triage reporting Otalgia.  Symptoms began several weeks ago.  Interventions attempted: OTC medications: Tylenol ..  Symptoms are: unchanged.  Triage Disposition: See Physician Within 24 Hours  Patient/caregiver understands and will follow disposition?: Yes                        Copied from CRM #[id number]. Topic: Clinical - Red Word Triage >> Feb 13, 2024  8:39 AM [name] wrote: Red Word that prompted transfer to Nurse Triage: really bad ear aches and drainage    ----------------------------------------------------------------------- From previous Reason for Contact - Scheduling: Patient/patient representative is calling to schedule an appointment. Refer to attachments for appointment information. Reason for Disposition  White, yellow, or green discharge (pus)  Answer Assessment - Initial Assessment Questions 1. LOCATION: Which ear is involved?     Right ear   2. ONSET: When did the ear pain start?      12/4   3. SEVERITY: How bad is the pain?  (Scale 1-10; mild, moderate or severe)     Pain occurs at night mostly- moderate  4. URI SYMPTOMS: Do you have a runny nose or cough?     Runny nose at night  5. FEVER: Do you have a fever? If Yes, ask: What is your temperature, how was it measured, and when did it start?     No   7. OTHER SYMPTOMS: Do you have any other symptoms? (e.g., decreased hearing, dizziness, headache, stiff neck, vomiting)     No       Patient called in to triage with complaints of persistent ear pain, drainage.  The patient stated he was seen in office on 12/4 for similar symptoms. He completed amoxicillin  rx. However ear pain has returned. For home care, the patient is taking OTC Tylenol .  Appointment scheduled for further  evaluation; and agrees with the plan of care, and will reach out if symptoms worsen or persist.  Protocols used: Rilla

## 2024-02-13 NOTE — Telephone Encounter (Signed)
 Apt scheduled.

## 2024-02-15 ENCOUNTER — Ambulatory Visit (INDEPENDENT_AMBULATORY_CARE_PROVIDER_SITE_OTHER): Admitting: Nurse Practitioner

## 2024-02-15 ENCOUNTER — Encounter: Payer: Self-pay | Admitting: Nurse Practitioner

## 2024-02-15 VITALS — BP 133/69 | HR 62 | Temp 97.8°F | Ht 71.0 in | Wt 214.0 lb

## 2024-02-15 DIAGNOSIS — H60501 Unspecified acute noninfective otitis externa, right ear: Secondary | ICD-10-CM | POA: Diagnosis not present

## 2024-02-15 MED ORDER — CIPROFLOXACIN-DEXAMETHASONE 0.3-0.1 % OT SUSP: 4.0000 [drp] | Freq: Two times a day (BID) | OTIC | 0 refills | Status: AC

## 2024-02-15 NOTE — Patient Instructions (Signed)
 Otitis Externa  Otitis externa is an infection of the outer ear canal. The outer ear canal is the area between the outside of the ear and the eardrum. Otitis externa is sometimes called swimmer's ear. What are the causes? Common causes of this condition include: Swimming in dirty water. Moisture in the ear. An injury to the inside of the ear. An object stuck in the ear. A cut or scrape on the outside of the ear or in the ear canal. What increases the risk? You are more likely to develop this condition if you go swimming often. What are the signs or symptoms? The first symptom of this condition is often itching in the ear. Later symptoms of the condition include: Swelling of the ear. Redness in the ear. Ear pain. The pain may get worse when you pull on your ear. Pus coming from the ear. How is this diagnosed? This condition may be diagnosed by examining the ear and testing fluid from the ear for bacteria and funguses. How is this treated? This condition may be treated with: Antibiotic ear drops. These are often given for 10-14 days. Medicines to reduce itching and swelling. Follow these instructions at home: If you were prescribed antibiotic ear drops, use them as told by your health care provider. Do not stop using the antibiotic even if you start to feel better. Take over-the-counter and prescription medicines only as told by your health care provider. Avoid getting water in your ears as told by your health care provider. This may include avoiding swimming or water sports for a few days. Keep all follow-up visits. This is important. How is this prevented? Keep your ears dry. Use the corner of a towel to dry your ears after you swim or bathe. Avoid scratching or putting things in your ear. Doing these things can damage the ear canal or remove the protective wax that lines it, which makes it easier for bacteria and funguses to grow. Avoid swimming in lakes, polluted water, or swimming  pools that may not have enough chlorine. Contact a health care provider if: You have a fever. Your ear is still red, swollen, painful, or draining pus after 3 days. Your redness, swelling, or pain gets worse. You have a severe headache. Get help right away if: You have redness, swelling, and pain or tenderness in the area behind your ear. Summary Otitis externa is an infection of the outer ear canal. Common causes include swimming in dirty water, moisture in the ear, or a cut or scrape in the ear. Symptoms include pain, redness, and swelling of the ear canal. If you were prescribed antibiotic ear drops, use them as told by your health care provider. Do not stop using the antibiotic even if you start to feel better. This information is not intended to replace advice given to you by your health care provider. Make sure you discuss any questions you have with your health care provider. Document Revised: 04/14/2020 Document Reviewed: 04/14/2020 Elsevier Patient Education  2024 [employer].

## 2024-02-15 NOTE — Progress Notes (Signed)
" ° °  Subjective:    Patient ID: [name], male    DOB: 05/04/1945, 79 y.o.   MRN: [id number]   Chief Complaint: Ear Pain (Right ear. Seen first of December)   HPI Patient in today c/o right ear pain. Was seen the first of December with same complaint. Was dx with otitis media and was given augmentin . And flonase . He says ear is still hurting. Patient Active Problem List   Diagnosis Date Noted   Prediabetes 12/26/2023   Seasonal allergies 12/26/2023   Phlegmon 07/29/2019   Anxiety 05/10/2018   Seasonal allergic rhinitis due to pollen 05/10/2018   Vitamin D  deficiency 06/09/2015   BMI 31.0-31.9,adult 04/23/2009   Hyperlipidemia with target LDL less than 100 04/21/2009   Depression 04/21/2009   Coronary atherosclerosis 04/21/2009   Carotid artery stenosis 04/21/2009   Osteoarthritis 04/21/2009       Review of Systems  Constitutional:  Negative for chills and fever.  HENT:  Positive for ear pain (right). Negative for congestion and sinus pressure.   Respiratory:  Negative for cough.        Objective:   Physical Exam Constitutional:      Appearance: Normal appearance.  HENT:     Right Ear: Drainage (thick yellowish pus in ear canal) present.     Left Ear: Tympanic membrane normal.     Ears:     Comments: Cannot see right TM Cardiovascular:     Rate and Rhythm: Normal rate and regular rhythm.     Heart sounds: Normal heart sounds.  Pulmonary:     Breath sounds: Normal breath sounds.  Skin:    General: Skin is warm.  Neurological:     General: No focal deficit present.     Mental Status: He is alert and oriented to person, place, and time.  Psychiatric:        Mood and Affect: Mood normal.        Behavior: Behavior normal.     BP 133/69   Pulse 62   Temp 97.8 F (36.6 C) (Temporal)   Ht 5' 11 (1.803 m)   Wt 214 lb (97.1 kg)   SpO2 94%   BMI 29.85 kg/m        Assessment & Plan:   Debby VEAR Sharps in today with chief complaint of Ear Pain (Right  ear. Seen first of December)   1. Acute non-infective otitis externa of right ear, unspecified type (Primary) Avoid getting water in ear Motrin or tylenol  Follow up in 2 weeks. - ciprofloxacin-dexamethasone (CIPRODEX) OTIC suspension; Place 4 drops into the right ear 2 (two) times daily.  Dispense: 7.5 mL; Refill: 0    The above assessment and management plan was discussed with the patient. The patient verbalized understanding of and has agreed to the management plan. Patient is aware to call the clinic if symptoms persist or worsen. Patient is aware when to return to the clinic for a follow-up visit. Patient educated on when it is appropriate to go to the emergency department.   Mary-Margaret Gladis, FNP   "

## 2024-02-28 ENCOUNTER — Ambulatory Visit: Admitting: Nurse Practitioner

## 2024-06-17 ENCOUNTER — Ambulatory Visit: Payer: Self-pay

## 2024-06-24 ENCOUNTER — Ambulatory Visit: Admitting: Family Medicine

## 2024-12-29 ENCOUNTER — Encounter: Admitting: Family Medicine
